# Patient Record
Sex: Male | Born: 1995 | Race: Black or African American | Hispanic: No | Marital: Single | Smoking: Former smoker
Health system: Southern US, Community
[De-identification: ages and names within clinical notes are randomized; demographics above are authoritative.]

## PROBLEM LIST (undated history)

## (undated) ENCOUNTER — Ambulatory Visit: Discharge status: Absent without leave | Payer: Medicaid Other

## (undated) DIAGNOSIS — F419 Anxiety disorder, unspecified: Secondary | ICD-10-CM

## (undated) DIAGNOSIS — E663 Overweight: Secondary | ICD-10-CM

## (undated) DIAGNOSIS — J40 Bronchitis, not specified as acute or chronic: Secondary | ICD-10-CM

## (undated) DIAGNOSIS — W3400XA Accidental discharge from unspecified firearms or gun, initial encounter: Secondary | ICD-10-CM

## (undated) HISTORY — DX: Overweight: E66.3

---
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
## 2013-01-12 ENCOUNTER — Emergency Department (HOSPITAL_COMMUNITY)
Admission: EM | Admit: 2013-01-12 | Discharge: 2013-01-12 | Disposition: A | Discharge status: Home / Self Care | Payer: Self-pay | Attending: Emergency Medicine | Admitting: Emergency Medicine

## 2013-01-12 ENCOUNTER — Encounter (HOSPITAL_COMMUNITY): Payer: Self-pay | Admitting: Emergency Medicine

## 2013-01-12 DIAGNOSIS — J45909 Unspecified asthma, uncomplicated: Secondary | ICD-10-CM | POA: Insufficient documentation

## 2013-01-12 DIAGNOSIS — Z880 Allergy status to penicillin: Secondary | ICD-10-CM | POA: Insufficient documentation

## 2013-01-12 DIAGNOSIS — T63461A Toxic effect of venom of wasps, accidental (unintentional), initial encounter: Secondary | ICD-10-CM | POA: Insufficient documentation

## 2013-01-12 DIAGNOSIS — Y929 Unspecified place or not applicable: Secondary | ICD-10-CM | POA: Insufficient documentation

## 2013-01-12 DIAGNOSIS — T6391XA Toxic effect of contact with unspecified venomous animal, accidental (unintentional), initial encounter: Secondary | ICD-10-CM | POA: Insufficient documentation

## 2013-01-12 DIAGNOSIS — Y939 Activity, unspecified: Secondary | ICD-10-CM | POA: Insufficient documentation

## 2013-01-12 MED ORDER — DEXAMETHASONE SODIUM PHOSPHATE 10 MG/ML IJ SOLN
10.0000 mg | Freq: Once | INTRAMUSCULAR | Status: AC
  Administered 2013-01-12: 10 mg via INTRAMUSCULAR
  Filled 2013-01-12: qty 1

## 2013-01-12 NOTE — Progress Notes (Signed)
Pt states pcp is [facility] [place] EPIC updated

## 2013-01-12 NOTE — ED Provider Notes (Signed)
  CSN: [id number]     Arrival date & time 01/12/13  1311 History     First MD Initiated Contact with Patient 01/12/13 1317     Chief Complaint  Patient presents with  . Insect Bite   (Consider location/radiation/quality/duration/timing/severity/associated sxs/prior Treatment) HPI Patient presents emergency department following a bee sting to his right lower jaw line.  Patient, states, that he decided to mess with a bees nest and was stung in the process.  Patient, states, that he's not having any difficulty breathing.  Mouth swelling, tongue swelling, nausea, vomiting or difficulty swallowing.  Patient, states, that he was given Benadryl by EMS.  He states the area feels better. Past Medical History  Diagnosis Date  . Asthma   . Bronchitis   . Bronchitis    No past surgical history on file. Family History  Problem Relation Age of Onset  . Asthma Mother    History  Substance Use Topics  . Smoking status: Never Smoker   . Smokeless tobacco: Never Used  . Alcohol Use: No    Review of Systems All other systems negative except as documented in the HPI. All pertinent positives and negatives as reviewed in the HPI. Allergies  Penicillins  Home Medications  No current outpatient prescriptions on file. BP 128/89  Pulse 76  Resp 20  SpO2 99% Physical Exam  Constitutional: He appears well-developed and well-nourished. No distress.  HENT:  Head: Normocephalic and atraumatic.    Cardiovascular: Normal rate, regular rhythm and normal heart sounds.  Exam reveals no gallop and no friction rub.   No murmur heard. Pulmonary/Chest: Effort normal and breath sounds normal.    ED Course   Procedures (including critical care time)  Patient, states he needs to go home.  Patient is feeling better at this time.  Patient is advised return here for any worsening in his condition  MDM    Carlyle Dolly, PA-C 01/12/13 1424

## 2013-01-12 NOTE — Progress Notes (Signed)
[facility] provided patient with a list of primary care resources in [place].  °

## 2013-01-12 NOTE — ED Notes (Addendum)
Patient with swelling to right side of face after being stung by a bee today.  No previous allergies to bee stings.  Hives on the back.  No respiratory distress.  EMS gave 50mg  Benadryl and 50mg  Zantac.

## 2013-01-13 NOTE — ED Provider Notes (Signed)
Medical screening examination/treatment/procedure(s) were performed by non-physician practitioner and as supervising physician I was immediately available for consultation/collaboration.  Yomaira Solar, MD 01/13/13 0806 

## 2013-04-19 ENCOUNTER — Other Ambulatory Visit (HOSPITAL_COMMUNITY)
Admission: RE | Admit: 2013-04-19 | Discharge: 2013-04-19 | Disposition: A | Discharge status: Home / Self Care | Payer: Medicaid Other | Source: Ambulatory Visit | Attending: Family Medicine | Admitting: Family Medicine

## 2013-04-19 ENCOUNTER — Emergency Department (INDEPENDENT_AMBULATORY_CARE_PROVIDER_SITE_OTHER)
Admission: EM | Admit: 2013-04-19 | Discharge: 2013-04-19 | Disposition: A | Discharge status: Home / Self Care | Payer: Self-pay | Source: Home / Self Care | Attending: Family Medicine | Admitting: Family Medicine

## 2013-04-19 ENCOUNTER — Encounter (HOSPITAL_COMMUNITY): Payer: Self-pay | Admitting: Emergency Medicine

## 2013-04-19 DIAGNOSIS — Z113 Encounter for screening for infections with a predominantly sexual mode of transmission: Secondary | ICD-10-CM | POA: Insufficient documentation

## 2013-04-19 DIAGNOSIS — B354 Tinea corporis: Secondary | ICD-10-CM

## 2013-04-19 DIAGNOSIS — R369 Urethral discharge, unspecified: Secondary | ICD-10-CM

## 2013-04-19 LAB — POCT URINALYSIS DIP (DEVICE)
Bilirubin Urine: NEGATIVE
Glucose, UA: NEGATIVE mg/dL
Ketones, ur: NEGATIVE mg/dL
Nitrite: NEGATIVE
Protein, ur: NEGATIVE mg/dL
Specific Gravity, Urine: 1.02 (ref 1.005–1.030)
Urobilinogen, UA: 0.2 mg/dL (ref 0.0–1.0)
pH: 7 (ref 5.0–8.0)

## 2013-04-19 LAB — HIV ANTIBODY (ROUTINE TESTING W REFLEX): HIV: NONREACTIVE

## 2013-04-19 LAB — RPR: RPR Ser Ql: NONREACTIVE

## 2013-04-19 MED ORDER — AZITHROMYCIN 250 MG PO TABS
1000.0000 mg | ORAL_TABLET | Freq: Once | ORAL | Status: AC
  Administered 2013-04-19: 1000 mg via ORAL

## 2013-04-19 MED ORDER — AZITHROMYCIN 250 MG PO TABS
ORAL_TABLET | ORAL | Status: AC
  Filled 2013-04-19: qty 4

## 2013-04-19 NOTE — ED Notes (Signed)
Provided crackers and water to take with medicine

## 2013-04-19 NOTE — ED Provider Notes (Signed)
Tracy [name] is a 17 y.o. male who presents to Urgent Care today for penile discharge present for one week. Patient notes mild dysuria. He denies any nausea vomiting diarrhea fevers or chills. This is consistent with the last time and Chlamydia.  Additionally he notes a rash in his groin. This is mildly itchy and has been off and on for several years. He has used Tinactin intermittently which has not helped. He feels well otherwise.  He has an allergy to penicillin. He is not sure of the reaction.   Past Medical History  Diagnosis Date  . Asthma   . Bronchitis   . Bronchitis    History  Substance Use Topics  . Smoking status: Never Smoker   . Smokeless tobacco: Never Used  . Alcohol Use: No   ROS as above Medications reviewed. Current Facility-Administered Medications  Medication Dose Route Frequency Provider Last Rate Last Dose  . azithromycin (ZITHROMAX) tablet 1,000 mg  1,000 mg Oral Once Rodolph Bong, MD       No current outpatient prescriptions on file.    Exam:  BP 138/74  Pulse 71  Temp(Src) 98.8 F (37.1 C) (Oral)  Resp 20  SpO2 97% Gen: Well NAD GENITAL: Normal-appearing circumcised penis with mild clear discharge. Testicles are nontender and normally palpated..  There is a were mild hyperpigmented rash in the groin. No scale. Blue green fluorescence under Wood's lamp.   Results for orders placed during the hospital encounter of 04/19/13 (from the past 24 hour(s))  POCT URINALYSIS DIP (DEVICE)     Status: Abnormal   Collection Time    04/19/13 12:44 PM      Result Value Range   Glucose, UA NEGATIVE  NEGATIVE mg/dL   Bilirubin Urine NEGATIVE  NEGATIVE   Ketones, ur NEGATIVE  NEGATIVE mg/dL   Specific Gravity, Urine 1.020  1.005 - 1.030   Hgb urine dipstick SMALL (*) NEGATIVE   pH 7.0  5.0 - 8.0   Protein, ur NEGATIVE  NEGATIVE mg/dL   Urobilinogen, UA 0.2  0.0 - 1.0 mg/dL   Nitrite NEGATIVE  NEGATIVE   Leukocytes, UA SMALL (*) NEGATIVE   No results  found.  Assessment and Plan: 17 y.o. male with  1) penile discharge. Concern for sexually transmitted infection.  Plan to treat with azithromycin 1 g once. We'll avoid ceftriaxone due to penicillin allergy. If gonorrhea is present will return to clinic in administer ceftriaxone under close observation. 2) rash: Tinea corporis. Plan to treat with Lamisil topical. Discussed warning signs or symptoms. Please see discharge instructions. Patient expresses understanding.      Rodolph Bong, MD 04/19/13 [phone]

## 2013-04-19 NOTE — ED Notes (Signed)
Pt is here for poss STD Sxs today include: penile d/c onset 1 week and dysuria for x2 days Denies: f/v/n/d Alert w/no signs of acute distress.

## 2013-04-21 ENCOUNTER — Telehealth (HOSPITAL_COMMUNITY): Payer: Self-pay | Admitting: *Deleted

## 2013-04-21 NOTE — ED Notes (Addendum)
GC/Chlamydia pos., Trich neg., HIV/RPR non-reactive.  Pt. adequately treated with Zithromax.  Pt. is allergic to PCN, but will need to be given Rocephin under observation per. Dr. Zollie Pee note.  Discussed with Dr. Artis Flock and he agreed. I called pt.  Pt. verified x 2 and given results.  Pt. told he was adequately treated with Zithromax for the Chlamydia and to come back for shot of Rocephin for GC.  Pt. instructed to notify his partner, no sex for 1 week and to practice safe sex. Pt. told he should get HIV rechecked in 6 mos. at the [facility] STD clinic, by appointment.  He said he would come back sometime this week. DHHS form for Chlamydia completed and faxed to the [facility].  Tracy [name] 04/21/2013

## 2013-04-24 ENCOUNTER — Emergency Department (INDEPENDENT_AMBULATORY_CARE_PROVIDER_SITE_OTHER)
Admission: EM | Admit: 2013-04-24 | Discharge: 2013-04-24 | Disposition: A | Discharge status: Home / Self Care | Payer: Self-pay | Source: Home / Self Care | Attending: Family Medicine | Admitting: Family Medicine

## 2013-04-24 ENCOUNTER — Encounter (HOSPITAL_COMMUNITY): Payer: Self-pay | Admitting: Emergency Medicine

## 2013-04-24 DIAGNOSIS — A5400 Gonococcal infection of lower genitourinary tract, unspecified: Secondary | ICD-10-CM

## 2013-04-24 DIAGNOSIS — A549 Gonococcal infection, unspecified: Secondary | ICD-10-CM

## 2013-04-24 DIAGNOSIS — A749 Chlamydial infection, unspecified: Secondary | ICD-10-CM

## 2013-04-24 MED ORDER — CEFTRIAXONE SODIUM 250 MG IJ SOLR
250.0000 mg | Freq: Once | INTRAMUSCULAR | Status: AC
  Administered 2013-04-24: 250 mg via INTRAMUSCULAR

## 2013-04-24 MED ORDER — CEFTRIAXONE SODIUM 250 MG IJ SOLR
INTRAMUSCULAR | Status: AC
  Filled 2013-04-24: qty 250

## 2013-04-24 MED ORDER — LIDOCAINE HCL (PF) 1 % IJ SOLN
INTRAMUSCULAR | Status: AC
  Filled 2013-04-24: qty 5

## 2013-04-24 NOTE — ED Notes (Signed)
Pt tolerated well Rocephin.

## 2013-04-24 NOTE — ED Provider Notes (Signed)
Tracy [name] is a 17 y.o. male who presents to Urgent Care today for gonorrhea. Patient was seen on November 19 for penile discharge. He was tested for gonorrhea and Chlamydia. He was treated empirically with azithromycin. A decision was made to avoid cephalosporins that time due to a remote unknown child penicillin allergy. He thinks perhaps it was a rash. He denies any history of anaphylaxis. His gonorrhea and Chlamydia tests were both positive and he was asked to return for ceftriaxone injection. He feels well currently. He is unsure if she is allergic to cephalosporins.    Past Medical History  Diagnosis Date  . Asthma   . Bronchitis   . Bronchitis    History  Substance Use Topics  . Smoking status: Never Smoker   . Smokeless tobacco: Never Used  . Alcohol Use: No   ROS as above Medications reviewed. Current Facility-Administered Medications  Medication Dose Route Frequency Provider Last Rate Last Dose  . cefTRIAXone (ROCEPHIN) injection 250 mg  250 mg Intramuscular Once Rodolph Bong, MD       No current outpatient prescriptions on file.    Exam:  BP 130/93  Pulse 73  Temp(Src) 99.1 F (37.3 C) (Oral)  Resp 16  SpO2 100% Gen: Well NAD   Assessment and Plan: 17 y.o. male with gonorrhea. Treatment with ceftriaxone. Recommend followup for test of cure at health Department in one month. Discussed warning signs or symptoms. Please see discharge instructions. Patient expresses understanding.      Rodolph Bong, MD 04/24/13 [phone]

## 2013-04-24 NOTE — ED Notes (Signed)
Pt is here to be treated for a pos GC/Chlam... Seen here on 11/19 Reports he is allergic to PCN Alert w/no signs of acute distress.

## 2013-04-25 NOTE — ED Notes (Signed)
DHHS form for GC sent to the [facility]. Tracy [name] 04/25/2013

## 2013-12-28 ENCOUNTER — Encounter (HOSPITAL_COMMUNITY): Payer: Self-pay | Admitting: Emergency Medicine

## 2013-12-28 ENCOUNTER — Emergency Department (HOSPITAL_COMMUNITY)
Admission: EM | Admit: 2013-12-28 | Discharge: 2013-12-28 | Disposition: A | Discharge status: Home / Self Care | Payer: Medicaid Other | Attending: Emergency Medicine | Admitting: Emergency Medicine

## 2013-12-28 DIAGNOSIS — J40 Bronchitis, not specified as acute or chronic: Secondary | ICD-10-CM

## 2013-12-28 DIAGNOSIS — Z880 Allergy status to penicillin: Secondary | ICD-10-CM | POA: Insufficient documentation

## 2013-12-28 DIAGNOSIS — R05 Cough: Secondary | ICD-10-CM | POA: Insufficient documentation

## 2013-12-28 DIAGNOSIS — Z87891 Personal history of nicotine dependence: Secondary | ICD-10-CM | POA: Diagnosis not present

## 2013-12-28 DIAGNOSIS — R059 Cough, unspecified: Secondary | ICD-10-CM | POA: Insufficient documentation

## 2013-12-28 DIAGNOSIS — J45909 Unspecified asthma, uncomplicated: Secondary | ICD-10-CM | POA: Insufficient documentation

## 2013-12-28 MED ORDER — AZITHROMYCIN 250 MG PO TABS: 250.0000 mg | ORAL_TABLET | Freq: Every day | ORAL | Status: DC

## 2013-12-28 MED ORDER — ALBUTEROL SULFATE HFA 108 (90 BASE) MCG/ACT IN AERS
2.0000 | INHALATION_SPRAY | RESPIRATORY_TRACT | Status: DC | PRN
  Administered 2013-12-28: 2 via RESPIRATORY_TRACT
  Filled 2013-12-28: qty 6.7

## 2013-12-28 MED ORDER — ACETAMINOPHEN 325 MG PO TABS
650.0000 mg | ORAL_TABLET | Freq: Once | ORAL | Status: AC
Start: 2013-12-28 — End: 2013-12-28
  Administered 2013-12-28: 650 mg via ORAL
  Filled 2013-12-28: qty 2

## 2013-12-28 MED ORDER — ALBUTEROL SULFATE (2.5 MG/3ML) 0.083% IN NEBU
5.0000 mg | INHALATION_SOLUTION | Freq: Once | RESPIRATORY_TRACT | Status: AC
  Administered 2013-12-28: 5 mg via RESPIRATORY_TRACT
  Filled 2013-12-28: qty 6

## 2013-12-28 NOTE — Discharge Instructions (Signed)

## 2013-12-28 NOTE — ED Notes (Addendum)
Fever, cough, wheezing began 3 days ago.   Pt. Would like a breathing treatment and inhaler.  Denies any asthma, only has bronchitis.

## 2013-12-28 NOTE — ED Notes (Signed)
Called no answer

## 2013-12-28 NOTE — ED Provider Notes (Signed)
CSN: [id number]     Arrival date & time 12/28/13  1614 History  This chart was scribed for Fayrene HelperBowie Allisson Schindel, PA-C working with Rolland PorterMark Sallie, MD by Evon Slackerrance Branch, ED Scribe. This patient was seen in room TR08C/TR08C and the patient's care was started at 5:21 PM.     Chief Complaint  Patient presents with  . URI   Patient is a 18 y.o. male presenting with URI. The history is provided by the patient. No language interpreter was used.  URI Presenting symptoms: cough, fever and sore throat   Presenting symptoms: no ear pain   Associated symptoms: wheezing    HPI Comments: Tracy [name] is a 18 y.o. male with a hx of bronchitis who presents to the Emergency Department complaining of URI sxs onset 2 days prior. He states he had cough, sore throat, wheezing, and a fever of 100.2. He states that he hasn't tried any medications prior to arrival. He states that he occasionally smokes. Denies hematemesis, myalgias, nausea, vomiting, diarrhea or  ear pain.   Past Medical History  Diagnosis Date  . Asthma   . Bronchitis   . Bronchitis    History reviewed. No pertinent past surgical history. Family History  Problem Relation Age of Onset  . Asthma Mother    History  Substance Use Topics  . Smoking status: Former Games developermoker  . Smokeless tobacco: Never Used  . Alcohol Use: Yes     Comment: occassional    Review of Systems  Constitutional: Positive for fever.  HENT: Positive for sore throat. Negative for ear pain.   Respiratory: Positive for cough and wheezing.   Gastrointestinal: Negative for nausea, vomiting and diarrhea.    Allergies  Penicillins  Home Medications   Prior to Admission medications   Not on File   Triage Vitals: BP 158/87  Pulse 89  Temp(Src) 100.2 F (37.9 C) (Oral)  Resp 20  SpO2 98%  Physical Exam  Nursing note and vitals reviewed. Constitutional: He is oriented to person, place, and time. He appears well-developed and well-nourished. No distress.  HENT:  Head:  Normocephalic and atraumatic.  Mouth/Throat: Uvula is midline and oropharynx is clear and moist. No trismus in the jaw. No oropharyngeal exudate, posterior oropharyngeal edema, posterior oropharyngeal erythema or tonsillar abscesses.  Eyes: Conjunctivae and EOM are normal.  Neck: Neck supple.  Cardiovascular: Normal rate and normal heart sounds.   Pulmonary/Chest: Effort normal and breath sounds normal. No respiratory distress. He has no wheezes.  Musculoskeletal: Normal range of motion.  Neurological: He is alert and oriented to person, place, and time.  Skin: Skin is warm and dry.  Psychiatric: He has a normal mood and affect. His behavior is normal.    ED Course  Procedures (including critical care time) DIAGNOSTIC STUDIES: Oxygen Saturation is 98% on RA, normal by my interpretation.    COORDINATION OF CARE: 5:29 PM-Discussed treatment plan which includes albuterol inhaler with pt at bedside and pt agreed to plan.   Will treat recurrent bronchitis with zpak, and albuterol rescue inhaler.  Pt currently report duonebs given in ER has helped his sxs.    Labs Review Labs Reviewed - No data to display  Imaging Review No results found.   EKG Interpretation None      MDM   Final diagnoses:  Bronchitis    BP 158/87  Pulse 89  Temp(Src) 100.2 F (37.9 C) (Oral)  Resp 20  SpO2 98%    I personally performed the services described in  this documentation, which was scribed in my presence. The recorded information has been reviewed and is accurate.      Fayrene Helper, PA-C 12/28/13 1731

## 2013-12-28 NOTE — ED Notes (Signed)
Ambulated while checking O2; sats WNL while ambulating

## 2014-01-02 NOTE — ED Provider Notes (Signed)
Medical screening examination/treatment/procedure(s) were performed by non-physician practitioner and as supervising physician I was immediately available for consultation/collaboration.   EKG Interpretation None        Rolland PorterMark Donn, MD 01/02/14 [phone]

## 2014-01-07 ENCOUNTER — Emergency Department (INDEPENDENT_AMBULATORY_CARE_PROVIDER_SITE_OTHER)
Admission: EM | Admit: 2014-01-07 | Discharge: 2014-01-07 | Disposition: A | Discharge status: Home / Self Care | Payer: Medicaid Other | Source: Home / Self Care | Attending: Family Medicine | Admitting: Family Medicine

## 2014-01-07 ENCOUNTER — Encounter (HOSPITAL_COMMUNITY): Payer: Self-pay | Admitting: Emergency Medicine

## 2014-01-07 DIAGNOSIS — K130 Diseases of lips: Secondary | ICD-10-CM

## 2014-01-07 DIAGNOSIS — R220 Localized swelling, mass and lump, head: Secondary | ICD-10-CM

## 2014-01-07 MED ORDER — DIPHENHYDRAMINE HCL 25 MG PO CAPS
25.0000 mg | ORAL_CAPSULE | Freq: Once | ORAL | Status: AC
  Administered 2014-01-07: 25 mg via ORAL

## 2014-01-07 MED ORDER — DIPHENHYDRAMINE HCL 25 MG PO CAPS
ORAL_CAPSULE | ORAL | Status: AC
  Filled 2014-01-07: qty 1

## 2014-01-07 NOTE — ED Notes (Signed)
Patient c/o swollen lip onset today. Patient reports he was around a cat yesterday and he is unsure if he is allergic. Patient denies SOB. Patient is alert and oriented and in no acute distress.

## 2014-01-07 NOTE — Discharge Instructions (Signed)
Thank you for coming in today. Take 25-50mg  of benadryl every 6 hours as needed for lip swelling.  Come back as needed.  Call or go to the emergency room if you get worse, have trouble breathing, have chest pains, or palpitations.  Go to the ER if your tongue starts to swell.   Angioedema Angioedema is a sudden swelling of tissues, often of the skin. It can occur on the face or genitals or in the abdomen or other body parts. The swelling usually develops over a short period and gets better in 24 to 48 hours. It often begins during the night and is found when the person wakes up. The person may also get red, itchy patches of skin (hives). Angioedema can be dangerous if it involves swelling of the air passages.  Depending on the cause, episodes of angioedema may only happen once, come back in unpredictable patterns, or repeat for several years and then gradually fade away.  CAUSES  Angioedema can be caused by an allergic reaction to various triggers. It can also result from nonallergic causes, including reactions to drugs, immune system disorders, viral infections, or an abnormal gene that is passed to you from your parents (hereditary). For some people with angioedema, the cause is unknown.  Some things that can trigger angioedema include:   Foods.   Medicines, such as ACE inhibitors, ARBs, nonsteroidal anti-inflammatory agents, or estrogen.   Latex.   Animal saliva.   Insect stings.   Dyes used in X-rays.   Mild injury.   Dental work.  Surgery.  Stress.   Sudden changes in temperature.   Exercise. SIGNS AND SYMPTOMS   Swelling of the skin.  Hives. If these are present, there is also intense itching.  Redness in the affected area.   Pain in the affected area.  Swollen lips or tongue.  Breathing problems. This may happen if the air passages swell.  Wheezing. If internal organs are involved, there may be:   Nausea.   Abdominal pain.   Vomiting.    Difficulty swallowing.   Difficulty passing urine. DIAGNOSIS   Your health care provider will examine the affected area and take a medical and family history.  Various tests may be done to help determine the cause. Tests may include:  Allergy skin tests to see if the problem is an allergic reaction.   Blood tests to check for hereditary angioedema.   Tests to check for underlying diseases that could cause the condition.   A review of your medicines, including over-the-counter medicines, may be done. TREATMENT  Treatment will depend on the cause of the angioedema. Possible treatments include:   Removal of anything that triggered the condition (such as stopping certain medicines).   Medicines to treat symptoms or prevent attacks. Medicines given may include:   Antihistamines.   Epinephrine injection.   Steroids.   Hospitalization may be required for severe attacks. If the air passages are affected, it can be an emergency. Tubes may need to be placed to keep the airway open. HOME CARE INSTRUCTIONS   Take all medicines as directed by your health care provider.  If you were given medicines for emergency allergy treatment, always carry them with you.  Wear a medical bracelet as directed by your health care provider.   Avoid known triggers. SEEK MEDICAL CARE IF:   You have repeat attacks of angioedema.   Your attacks are more frequent or more severe despite preventive measures.   You have hereditary angioedema and are  considering having children. It is important to discuss with your health care provider the risks of passing the condition on to your children. SEEK IMMEDIATE MEDICAL CARE IF:   You have severe swelling of the mouth, tongue, or lips.  You have difficulty breathing.   You have difficulty swallowing.   You faint. MAKE SURE YOU:  Understand these instructions.  Will watch your condition.  Will get help right away if you are not doing  well or get worse. Document Released: 07/27/2001 Document Revised: 10/02/2013 Document Reviewed: 01/09/2013 [facility] Patient Information 2015 [place]. This information is not intended to replace advice given to you by your health care provider. Make sure you discuss any questions you have with your health care provider.

## 2014-01-07 NOTE — ED Provider Notes (Signed)
Tracy [name] is a [age] y.o. male who presents to Urgent Care today for upper lip sweling. Swelling developed this morning after spending a night at a friend's apartment where a cat slept in the bed. He feels well otherwise. No tongue swelling or trouble breathing, speaking or swallowing. No injury. No new meds. No treatment tried yet.    Past Medical History  Diagnosis Date  . Asthma   . Bronchitis   . Bronchitis    History  Substance Use Topics  . Smoking status: Former Games developermoker  . Smokeless tobacco: Never Used  . Alcohol Use: Yes     Comment: occassional   ROS as above Medications: Current Facility-Administered Medications  Medication Dose Route Frequency Provider Last Rate Last Dose  . diphenhydrAMINE (BENADRYL) capsule 25 mg  25 mg Oral Once Rodolph BongEvan S Bentley Fissel, MD       Current Outpatient Prescriptions  Medication Sig Dispense Refill  . azithromycin (ZITHROMAX) 250 MG tablet Take 1 tablet (250 mg total) by mouth daily.  4 tablet  0    Exam:  BP 135/87  Pulse 60  Temp(Src) 98.4 F (36.9 C) (Oral)  SpO2 100% Gen: Well NAD HEENT: EOMI,  MMM, moderate upper lip swelling. Non-tender. No tongue swelling. Lungs: Normal work of breathing. CTABL Heart: RRR no MRG Abd: NABS, Soft. Nondistended, Nontender Exts: Brisk capillary refill, warm and well perfused.   No results found for this or any previous visit (from the past 24 hour(s)). No results found.  Assessment and Plan: [age] y.o. male with upper lip swelling. Likely local reaction to cat allergies.  Plan to tx with benadryl.  F/u prn.   Discussed warning signs or symptoms. Please see discharge instructions. Patient expresses understanding.   This note was created using Conservation officer, historic buildingsDragon voice recognition software. Any transcription errors are unintended.    Rodolph BongEvan S Fatoumata Albaugh, MD 01/07/14 [phone]

## 2014-11-09 ENCOUNTER — Emergency Department (HOSPITAL_COMMUNITY)
Admission: EM | Admit: 2014-11-09 | Discharge: 2014-11-09 | Disposition: A | Discharge status: Home / Self Care | Payer: Self-pay | Attending: Emergency Medicine | Admitting: Emergency Medicine

## 2014-11-09 ENCOUNTER — Emergency Department (HOSPITAL_COMMUNITY): Payer: Medicaid Other

## 2014-11-09 ENCOUNTER — Encounter (HOSPITAL_COMMUNITY): Payer: Self-pay | Admitting: Emergency Medicine

## 2014-11-09 DIAGNOSIS — Z880 Allergy status to penicillin: Secondary | ICD-10-CM | POA: Insufficient documentation

## 2014-11-09 DIAGNOSIS — S0990XA Unspecified injury of head, initial encounter: Secondary | ICD-10-CM | POA: Insufficient documentation

## 2014-11-09 DIAGNOSIS — Y998 Other external cause status: Secondary | ICD-10-CM | POA: Insufficient documentation

## 2014-11-09 DIAGNOSIS — Z720 Tobacco use: Secondary | ICD-10-CM | POA: Insufficient documentation

## 2014-11-09 DIAGNOSIS — J45909 Unspecified asthma, uncomplicated: Secondary | ICD-10-CM | POA: Insufficient documentation

## 2014-11-09 DIAGNOSIS — S3992XA Unspecified injury of lower back, initial encounter: Secondary | ICD-10-CM | POA: Insufficient documentation

## 2014-11-09 DIAGNOSIS — Y92410 Unspecified street and highway as the place of occurrence of the external cause: Secondary | ICD-10-CM | POA: Insufficient documentation

## 2014-11-09 DIAGNOSIS — S6991XA Unspecified injury of right wrist, hand and finger(s), initial encounter: Secondary | ICD-10-CM | POA: Insufficient documentation

## 2014-11-09 DIAGNOSIS — Y9389 Activity, other specified: Secondary | ICD-10-CM | POA: Insufficient documentation

## 2014-11-09 DIAGNOSIS — Z792 Long term (current) use of antibiotics: Secondary | ICD-10-CM | POA: Insufficient documentation

## 2014-11-09 MED ORDER — KETOROLAC TROMETHAMINE 60 MG/2ML IM SOLN
60.0000 mg | Freq: Once | INTRAMUSCULAR | Status: AC
  Administered 2014-11-09: 60 mg via INTRAMUSCULAR
  Filled 2014-11-09: qty 2
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
## 2014-11-09 NOTE — ED Notes (Signed)
Patient transported to X-ray via wheelchair 

## 2014-11-09 NOTE — ED Notes (Signed)
Spoke with ortho tech for pt receiving thumb splint

## 2014-11-09 NOTE — Progress Notes (Signed)
Orthopedic Tech Progress Note Patient Details:  Tracy [name] 06/09/1995 [id number]  Ortho Devices Type of Ortho Device: Thumb velcro splint Ortho Device/Splint Location: Right Ortho Device/Splint Interventions: Application   Tracy [name] 11/09/2014, 8:19 AM
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
## 2014-12-26 ENCOUNTER — Emergency Department (HOSPITAL_COMMUNITY)
Admission: EM | Admit: 2014-12-26 | Discharge: 2014-12-26 | Disposition: A | Discharge status: Home / Self Care | Payer: Medicaid Other | Attending: Emergency Medicine | Admitting: Emergency Medicine

## 2014-12-26 ENCOUNTER — Encounter (HOSPITAL_COMMUNITY): Payer: Self-pay | Admitting: Family Medicine

## 2014-12-26 ENCOUNTER — Emergency Department (HOSPITAL_COMMUNITY): Payer: Medicaid Other

## 2014-12-26 DIAGNOSIS — Z720 Tobacco use: Secondary | ICD-10-CM | POA: Insufficient documentation

## 2014-12-26 DIAGNOSIS — R0789 Other chest pain: Secondary | ICD-10-CM | POA: Insufficient documentation

## 2014-12-26 DIAGNOSIS — J45901 Unspecified asthma with (acute) exacerbation: Secondary | ICD-10-CM | POA: Insufficient documentation

## 2014-12-26 DIAGNOSIS — F419 Anxiety disorder, unspecified: Secondary | ICD-10-CM | POA: Insufficient documentation

## 2014-12-26 DIAGNOSIS — Z792 Long term (current) use of antibiotics: Secondary | ICD-10-CM | POA: Insufficient documentation

## 2014-12-26 DIAGNOSIS — Z880 Allergy status to penicillin: Secondary | ICD-10-CM | POA: Insufficient documentation

## 2014-12-26 LAB — CBC WITH DIFFERENTIAL/PLATELET
Basophils Absolute: 0 10*3/uL (ref 0.0–0.1)
Basophils Relative: 2 % — ABNORMAL HIGH (ref 0–1)
Eosinophils Absolute: 0.2 10*3/uL (ref 0.0–0.7)
Eosinophils Relative: 7 % — ABNORMAL HIGH (ref 0–5)
HCT: 44.6 % (ref 39.0–52.0)
Hemoglobin: 16.1 g/dL (ref 13.0–17.0)
Lymphocytes Relative: 42 % (ref 12–46)
Lymphs Abs: 1.1 10*3/uL (ref 0.7–4.0)
MCH: 31 pg (ref 26.0–34.0)
MCHC: 36.1 g/dL — ABNORMAL HIGH (ref 30.0–36.0)
MCV: 85.8 fL (ref 78.0–100.0)
Monocytes Absolute: 0.2 10*3/uL (ref 0.1–1.0)
Monocytes Relative: 9 % (ref 3–12)
Neutro Abs: 1 10*3/uL — ABNORMAL LOW (ref 1.7–7.7)
Neutrophils Relative %: 40 % — ABNORMAL LOW (ref 43–77)
Platelets: 219 10*3/uL (ref 150–400)
RBC: 5.2 MIL/uL (ref 4.22–5.81)
RDW: 12.2 % (ref 11.5–15.5)
WBC: 2.6 10*3/uL — ABNORMAL LOW (ref 4.0–10.5)

## 2014-12-26 LAB — BASIC METABOLIC PANEL
Anion gap: 6 (ref 5–15)
BUN: 11 mg/dL (ref 6–20)
CO2: 26 mmol/L (ref 22–32)
Calcium: 9.3 mg/dL (ref 8.9–10.3)
Chloride: 106 mmol/L (ref 101–111)
Creatinine, Ser: 0.93 mg/dL (ref 0.61–1.24)
GFR calc Af Amer: >60 mL/min (ref >60)
GFR calc non Af Amer: >60 mL/min (ref >60)
Glucose, Bld: 98 mg/dL (ref 65–99)
Potassium: 3.7 mmol/L (ref 3.5–5.1)
Sodium: 138 mmol/L (ref 135–145)

## 2014-12-26 MED ORDER — LORAZEPAM 1 MG PO TABS: 1.0000 mg | ORAL_TABLET | Freq: Three times a day (TID) | ORAL | Status: DC | PRN

## 2014-12-26 NOTE — ED Provider Notes (Signed)
CSN: [id number]     Arrival date & time 12/26/14  1454 History   First MD Initiated Contact with Patient 12/26/14 1636     Chief Complaint  Patient presents with  . Dizziness     (Consider location/radiation/quality/duration/timing/severity/associated sxs/prior Treatment) Patient is a 19 y.o. male presenting with dizziness. The history is provided by the patient and medical records.  Dizziness    19 year old male with history of asthma and bronchitis, presenting to the ED for chest tightness and shortness of breath. Patient states he was lying in bed this morning when symptoms began. He states he was very hard to catch his breath. He states this lasted for approximately 20 minutes. Afterwards he did feel somewhat lightheaded. No syncope. Patient reports he believes that he suffers from anxiety. He states he feels this way frequently when he is stressed out or is very anxious. He denies any recent increase in stress. He states he has been having difficulty falling asleep and has been waking up multiple times throughout the night. He is averaging 3-4 hours of sleep a night for the past 2 weeks. He denies any excessive caffeine intake. Currently, patient is asymptomatic. No personal or family cardiac history. Patient is an occasional smoker. He denies any illicit drug use.  Past Medical History  Diagnosis Date  . Asthma   . Bronchitis   . Bronchitis    History reviewed. No pertinent past surgical history. Family History  Problem Relation Age of Onset  . Asthma Mother    History  Substance Use Topics  . Smoking status: Current Some Day Smoker    Types: Cigarettes  . Smokeless tobacco: Never Used  . Alcohol Use: Yes     Comment: occassional    Review of Systems  Respiratory: Positive for chest tightness.   Neurological: Positive for dizziness.  All other systems reviewed and are negative.     Allergies  Penicillins  Home Medications   Prior to Admission medications    Medication Sig Start Date End Date Taking? Authorizing Provider  azithromycin (ZITHROMAX) 250 MG tablet Take 1 tablet (250 mg total) by mouth daily. 12/28/13   Fayrene Helper, PA-C   BP 133/70 mmHg  Pulse 63  Temp(Src) 98.3 F (36.8 C) (Oral)  Resp 18  SpO2 97%   Physical Exam  Constitutional: He is oriented to person, place, and time. He appears well-developed and well-nourished. No distress.  NAD, texting on cell phone  HENT:  Head: Normocephalic and atraumatic.  Mouth/Throat: Oropharynx is clear and moist.  Eyes: Conjunctivae and EOM are normal. Pupils are equal, round, and reactive to light.  Neck: Normal range of motion. Neck supple.  Cardiovascular: Normal rate, regular rhythm and normal heart sounds.   Pulmonary/Chest: Effort normal and breath sounds normal. No respiratory distress. He has no wheezes.  Abdominal: Soft. Bowel sounds are normal. There is no tenderness. There is no guarding.  Musculoskeletal: Normal range of motion. He exhibits no edema.  Neurological: He is alert and oriented to person, place, and time.  AAOx3, answering questions and following commands appropriately; equal strength UE and LE bilaterally; CN grossly intact; moves all extremities appropriately without ataxia; no focal neuro deficits or facial asymmetry appreciated; normal coordination, normal gait  Skin: Skin is warm and dry. He is not diaphoretic.  Psychiatric: He has a normal mood and affect. He is not actively hallucinating. He expresses no homicidal and no suicidal ideation. He expresses no suicidal plans and no homicidal plans.  Nursing note and  vitals reviewed.   ED Course  Procedures (including critical care time) Labs Review Labs Reviewed  CBC WITH DIFFERENTIAL/PLATELET - Abnormal; Notable for the following:    WBC 2.6 (*)    MCHC 36.1 (*)    Neutrophils Relative % 40 (*)    Neutro Abs 1.0 (*)    Eosinophils Relative 7 (*)    Basophils Relative 2 (*)    All other components within  normal limits  BASIC METABOLIC PANEL    Imaging Review Dg Chest 2 View  12/26/2014   CLINICAL DATA:  Shortness of breath with chest tightness for 1 day  EXAM: CHEST  2 VIEW  COMPARISON:  August 10, 2011  FINDINGS: Lungs are clear. Heart size and pulmonary vascularity are normal. No adenopathy. No pneumothorax. No bone lesions.  IMPRESSION: No abnormality noted.   Electronically Signed   By: Bretta Bang III M.D.   On: 12/26/2014 17:03     EKG Interpretation   Date/Time:  Wednesday December 26 2014 17:17:49 EDT Ventricular Rate:  64 PR Interval:  150 QRS Duration: 80 QT Interval:  396 QTC Calculation: 408 R Axis:   79 Text Interpretation:  Normal sinus rhythm with sinus arrhythmia Normal ECG  No old tracing to compare Confirmed by BELFI  MD, MELANIE ([id number]) on  12/26/2014 5:37:56 PM      MDM   Final diagnoses:  Chest tightness   19 year old male here with the episode of chest tightness, shortness of breath, and dizziness this morning. He suspects he had an anxiety attack, history of same. Patient is afebrile, nontoxic. He does not appear to be in any distress on my exam. He is lying comfortably in bed resting on his cell phone. His neurologic exam is nonfocal. Brief workup including EKG, labs, and chest x-ray negative for acute findings.  Patient remains calm here, no significant anxiety noted. I doubt ACS, PE, dissection, or other acute cardiac event. Also doubt acute intracranial pathology given normal neurologic exam and no known risk factors. Symptoms may have anxiety component. No SI/HI/AVH. Will discharge home with when necessary Ativan. Patient [facility] as well as outpatient psychiatry resources if needed.  Discussed plan with patient, he/she acknowledged understanding and agreed with plan of care.  Return precautions given for new or worsening symptoms.  Garlon Hatchet, PA-C 12/26/14 2001  Rolan Bucco, MD 12/26/14 2113

## 2014-12-26 NOTE — ED Notes (Addendum)
Pt sts he was lying in bed and became SOB and chest was tight. sts cough. sts he just doesn't feel well. sts he hasn't been getting much sleep. sts hx of anxiety attack.

## 2014-12-26 NOTE — ED Notes (Signed)
Pt still on his phone

## 2014-12-26 NOTE — Discharge Instructions (Signed)
Take the prescribed medication as directed. [facility]  If you feel you to see a psychiatrist or counselor, please see resource guide below for outpatient follow-up. Return to the ED for new or worsening symptoms.   Emergency Department Resource Guide 1) Find a Doctor and Pay Out of Pocket Although you won't have to find out who is covered by your insurance plan, it is a good idea to ask around and get recommendations. You will then need to call the office and see if the doctor you have chosen will accept you as a new patient and what types of options they offer for patients who are self-pay. Some doctors offer discounts or will set up payment plans for their patients who do not have insurance, but you will need to ask so you aren't surprised when you get to your appointment.  2) Contact Your Local Health Department Not all health departments have doctors that can see patients for sick visits, but many do, so it is worth a call to see if yours does. If you don't know where your local health department is, you can check in your phone book. The CDC also has a tool to help you locate your state's health department, and many state websites also have listings of all of their local health departments.  3) Find a Walk-in Clinic If your illness is not likely to be very severe or complicated, you may want to try a walk in clinic. These are popping up all over the country in pharmacies, drugstores, and shopping centers. They're usually staffed by nurse practitioners or physician assistants that have been trained to treat common illnesses and complaints. They're usually fairly quick and inexpensive. However, if you have serious medical issues or chronic medical problems, these are probably not your best option.  No Primary Care Doctor: - Call Health Connect at  [phone] - they can help you locate a primary care doctor that  accepts your insurance, provides certain services,  etc. - Physician Referral Service- [phone]  Chronic Pain Problems: Organization         Address  Phone   Notes  [facility]  [phone] Patients need to be referred by their primary care doctor.   Medication Assistance: Organization         Address  Phone   Notes  [facility] Medication [facility] [address] [place] [place], [place] [address] [phone] --Must be a resident of [facility] -- Must have NO insurance coverage whatsoever (no Medicaid/ Medicare, etc.) -- The pt. MUST have a primary care doctor that directs their care regularly and follows them in the community   Med[facility]ist  [phone]   [employer]  [phone]    Agencies that provide inexpensive medical care: Organization         Address  Phone   Notes  [facility]  [phone]   [facility]    [phone]   [facility] [address] [place], [place] [address] [phone]   [facility] [address] [place] [place] [phone]   Planned Parenthood    [phone]   [facility]    [phone]   [facility]  [address], [place] Phone:  [phone], Fax:  [phone] Hours of Operation:  9 am - 6 pm, M-F.  Also accepts Medicaid/Medicare and  self-pay.  [facility] for [place], [address], [place] Phone: [phone], Fax: [phone]. Hours of Operation:  8:30 am - 5:30 pm, M-F.  Also accepts Medicaid and self-pay.  [facility] [place] [address] [place] Phone: [phone]   [place], [place], [place] [phone], Ext. [phone] Mondays & Thursdays: 7-9 AM.  First 15 patients are seen on a first come, first serve basis.    [place] Providers:  Organization         Address  Phone   Notes  [facility] [address], [place] [phone] Also accepts self-pay patients.  [facility] [id number] [place], [place]  [phone]   [place], [address], [place] [phone]   [facility] [address], [place] [phone]   Lucianne Lei [address], [place]   [phone] Only accepts [place] Access [place] patients after they have their name applied to their card.   Self-Pay (no insurance) in [facility]:  Organization         Address  Phone   Notes  Sickle Cell Patients, [facility] [place] [phone]   [facility] [place] [phone]   [facility] [place]  [place], [place], [place] [phone]   [facility]/Dr. Osei-Bonsu  [address], [place] or [address], [place] [phone] Phone number for both [place] and [place] locations is the same.  [facility]gent Medical and [facility] [place] [address] [place] [phone]   [facility] [address], [place] or [address] [phone]   [facility] [address] [place] [phone], phone; [phone], fax Sees patients 1st and 3rd Saturday of every month.  Must not qualify for public or private insurance (i.e. Medicaid, Medicare, [place] Health Choice, Veterans' Benefits)  Household income should be no more than 200% of the poverty level The clinic cannot treat you if you are pregnant or think you are pregnant  Sexually transmitted diseases are not treated at the clinic.    Dental Care: Organization         Address  Phone  Notes  [facility] of [facility] [place] [phone] Accepts children up to  age 32 who are enrolled in [place] or [place]; pregnant women with a Medicaid card; and children who have applied for Medicaid or [place] Health Choice, but were declined, whose parents can pay a reduced fee at time of service.  [facility] Department of [facility]  [address], [place] [phone] Accepts children up to age 69 who are enrolled in [place] or [place]; pregnant women with a Medicaid card; and children who have applied for Medicaid or  Health Choice, but were declined, whose parents can pay a reduced fee at time of service.  [facility] Adult Dental Access PROGRAM  [place] [phone] Patients are seen by appointment only. Walk-ins are not accepted. [place] will see patients 72 years of age and older. Monday - Tuesday (8am-5pm) Most Wednesdays (8:30-5pm) $  30 per visit, cash only  [facility] Adult [employer] PROGRAM  [address], [facility] [phone] Patients are seen by appointment only. Walk-ins are not accepted. [place] will see patients 41 years of age and older. One Wednesday Evening (Monthly: Volunteer Based).  $30 per visit, cash only  [place]  [phone] for adults; Children under age 58, call Graduate Pediatric Dentistry at [phone]. Children aged 52-14, please call [phone] to request a pediatric application.  Dental services are provided in all areas of dental care including fillings, crowns and bridges, complete and partial dentures, implants, gum treatment, root canals, and extractions. Preventive care is also provided. Treatment is provided to both adults and children. Patients are selected via a lottery and there is often a waiting list.   [facility] [address], [place]  [phone] [email or web address]   [facility] [address] [place], [place] [phone], Ext. [phone] Second and Fourth Thursday of  each month, opens at 6:30 AM; Clinic ends at 9 AM.  Patients are seen on a first-come first-served basis, and a limited number are seen during each clinic.   [facility]  [address] Hillard Danker [place], [place] [phone]   Eligibility Requirements You must have lived in [place], [place], or [place] counties for at least the last three months.   You cannot be eligible for state or federal sponsored [employer], including [employer], [place], or [employer].   You generally cannot be eligible for healthcare insurance through your employer.    How to apply: Eligibility screenings are held every Tuesday and Wednesday afternoon from 1:00 pm until 4:00 pm. You do not need an appointment for the interview!  [facility] [address] [place], [place]   [place]  [place] Department  [place]  [phone]    Behavioral Health Resources in the Community: Intensive Outpatient Programs Organization         Address  Phone  Notes  [place]. [address], [place], [place] [phone]   [facility] [address] [place], [place]   ADS: [facility] [address] [place], [place]   [place] [address],  [place], [place] or [phone]   Substance Abuse Resources Organization         Address  Phone  Notes  Alcohol and Drug Services  [phone]   [place]  [phone]   [place]   Chinita Pester  [phone]   Residential & Outpatient Substance Abuse Program  [phone]   Psychological Services Organization         Address  Phone  Notes  [facility] [place]  [place]  [phone]   [place] [address]  [place] or [phone]    Mobile Crisis Teams Organization         Address  Phone  Notes  [facility], Mobile Crisis Care Unit  [phone]   [facility]  [address] [place], [place]   [place] Levels [address] [place] [phone]    Self-Help/Support Groups Organization         Address  Phone  Notes  [facility] [place] - variety of support groups  [place] Call for more information  Narcotics Anonymous (NA), [facility]aring [facility] [address], [employer] [place]  2 meetings at this location   Special educational needs teacher         Address  Phone  Notes  ASAP Residential Treatment [place],    [place]  [phone]   [facility]  [address], [place] [phone], [place], [place]   [place], [place] [phone] Admissions: 8am-3pm M-F  Incentives Substance [place] [address],    [place], [place] [phone]   [facility] [address] [place], [place], [place]   [facility] [address]  [place], [place]   [facility] Programs - Intensive Outpatient [address], [place], [place]   [facility] ([place].) [place].,  [place], [place] [phone] or [phone]   [facility] Services (RTS) [address] [place], [place] Accepts Medicaid  [facility] [place] [address],  [place] [phone] Substance Abuse/Addiction Treatment   [facility] Organization         Address  Phone  Notes  [facility]  [phone]   Domenic Schwab, PhD [address] Arlis Porta [place], [place]   [phone] or [phone]   [place], [place] [phone]     [facility] [address], [place], [place] [phone] Insurance/Medicaid/sponsorship through [facility] and Families [address], [address]                                    [place], [place] [phone] [place] [address], [place] [phone]    Dr. Adele Schilder  [phone]   [facility] Dept. 1) [address], [place] 2) [place] 3)  [place], [place] [phone]  [phone]   [place] [phone] or [phone] (After Hours)

## 2014-12-26 NOTE — ED Notes (Signed)
The pt reports anxiety and difficulty sleeping for a while  He is currently texting on his phone

## 2014-12-26 NOTE — ED Notes (Signed)
Pt. Returned from xray 

## 2015-03-28 ENCOUNTER — Encounter (HOSPITAL_COMMUNITY): Payer: Self-pay | Admitting: Emergency Medicine

## 2015-03-28 ENCOUNTER — Emergency Department (HOSPITAL_COMMUNITY)
Admission: EM | Admit: 2015-03-28 | Discharge: 2015-03-28 | Disposition: A | Discharge status: Home / Self Care | Payer: Medicaid Other | Attending: Emergency Medicine | Admitting: Emergency Medicine

## 2015-03-28 DIAGNOSIS — R066 Hiccough: Secondary | ICD-10-CM | POA: Insufficient documentation

## 2015-03-28 DIAGNOSIS — Z113 Encounter for screening for infections with a predominantly sexual mode of transmission: Secondary | ICD-10-CM | POA: Insufficient documentation

## 2015-03-28 DIAGNOSIS — Z720 Tobacco use: Secondary | ICD-10-CM | POA: Insufficient documentation

## 2015-03-28 DIAGNOSIS — R112 Nausea with vomiting, unspecified: Secondary | ICD-10-CM | POA: Insufficient documentation

## 2015-03-28 DIAGNOSIS — J45909 Unspecified asthma, uncomplicated: Secondary | ICD-10-CM | POA: Insufficient documentation

## 2015-03-28 DIAGNOSIS — Z880 Allergy status to penicillin: Secondary | ICD-10-CM | POA: Insufficient documentation

## 2015-03-28 LAB — COMPREHENSIVE METABOLIC PANEL
ALT: 13 U/L — ABNORMAL LOW (ref 17–63)
AST: 19 U/L (ref 15–41)
Albumin: 4.9 g/dL (ref 3.5–5.0)
Alkaline Phosphatase: 53 U/L (ref 38–126)
Anion gap: 7 (ref 5–15)
BUN: 14 mg/dL (ref 6–20)
CO2: 31 mmol/L (ref 22–32)
Calcium: 9.8 mg/dL (ref 8.9–10.3)
Chloride: 101 mmol/L (ref 101–111)
Creatinine, Ser: 1.1 mg/dL (ref 0.61–1.24)
GFR calc Af Amer: >60 mL/min (ref >60)
GFR calc non Af Amer: >60 mL/min (ref >60)
Glucose, Bld: 90 mg/dL (ref 65–99)
Potassium: 4.1 mmol/L (ref 3.5–5.1)
Sodium: 139 mmol/L (ref 135–145)
Total Bilirubin: 1 mg/dL (ref 0.3–1.2)
Total Protein: 7.8 g/dL (ref 6.5–8.1)

## 2015-03-28 LAB — LIPASE, BLOOD: Lipase: 31 U/L (ref 11–51)

## 2015-03-28 LAB — URINALYSIS, ROUTINE W REFLEX MICROSCOPIC
Bilirubin Urine: NEGATIVE
Glucose, UA: NEGATIVE mg/dL
Ketones, ur: NEGATIVE mg/dL
Nitrite: NEGATIVE
Protein, ur: NEGATIVE mg/dL
Specific Gravity, Urine: 1.016 (ref 1.005–1.030)
Urobilinogen, UA: 0.2 mg/dL (ref 0.0–1.0)
pH: 6 (ref 5.0–8.0)

## 2015-03-28 LAB — CBC
HCT: 48.7 % (ref 39.0–52.0)
Hemoglobin: 17.5 g/dL — ABNORMAL HIGH (ref 13.0–17.0)
MCH: 30.5 pg (ref 26.0–34.0)
MCHC: 35.9 g/dL (ref 30.0–36.0)
MCV: 85 fL (ref 78.0–100.0)
Platelets: 265 10*3/uL (ref 150–400)
RBC: 5.73 MIL/uL (ref 4.22–5.81)
RDW: 12.2 % (ref 11.5–15.5)
WBC: 3.5 10*3/uL — ABNORMAL LOW (ref 4.0–10.5)

## 2015-03-28 LAB — URINE MICROSCOPIC-ADD ON

## 2015-03-28 MED ORDER — ONDANSETRON HCL 4 MG/2ML IJ SOLN
4.0000 mg | Freq: Once | INTRAMUSCULAR | Status: AC
  Administered 2015-03-28: 4 mg via INTRAVENOUS
  Filled 2015-03-28: qty 2

## 2015-03-28 MED ORDER — SODIUM CHLORIDE 0.9 % IV SOLN
1000.0000 mL | INTRAVENOUS | Status: DC
  Administered 2015-03-28: 1000 mL via INTRAVENOUS

## 2015-03-28 MED ORDER — SODIUM CHLORIDE 0.9 % IV SOLN
1000.0000 mL | Freq: Once | INTRAVENOUS | Status: AC
  Administered 2015-03-28: 1000 mL via INTRAVENOUS

## 2015-03-28 MED ORDER — AZITHROMYCIN 250 MG PO TABS
1000.0000 mg | ORAL_TABLET | Freq: Once | ORAL | Status: AC
  Administered 2015-03-28: 1000 mg via ORAL
  Filled 2015-03-28: qty 4

## 2015-03-28 MED ORDER — ONDANSETRON 4 MG PO TBDP: 4.0000 mg | ORAL_TABLET | Freq: Once | ORAL | Status: DC

## 2015-03-28 MED ORDER — METRONIDAZOLE 500 MG PO TABS
2000.0000 mg | ORAL_TABLET | Freq: Once | ORAL | Status: AC
  Administered 2015-03-28: 2000 mg via ORAL
  Filled 2015-03-28: qty 4

## 2015-03-28 MED ORDER — LIDOCAINE HCL (PF) 1 % IJ SOLN
INTRAMUSCULAR | Status: AC
  Administered 2015-03-28: 0.9 mL
  Filled 2015-03-28: qty 5

## 2015-03-28 MED ORDER — CEFTRIAXONE SODIUM 250 MG IJ SOLR
250.0000 mg | Freq: Once | INTRAMUSCULAR | Status: AC
  Administered 2015-03-28: 250 mg via INTRAMUSCULAR
  Filled 2015-03-28: qty 250

## 2015-03-28 NOTE — Progress Notes (Signed)
Patient noted to not have a pcp or insurance living in [place].  [facility] printed free medical clinics and contact information for DSS in [place] [facility] [place] [facility].  EDCM went to speak to patient at bedside, however, patient was discharged.

## 2015-03-28 NOTE — Discharge Instructions (Signed)
You have been seen today for vomiting and a STD check. Your lab results will be called to you. Follow up with PCP as needed. Return to ED should symptoms worsen.

## 2015-03-28 NOTE — ED Provider Notes (Signed)
CSN: [id number]     Arrival date & time 03/28/15  1737 History   First MD Initiated Contact with Patient 03/28/15 1924     Chief Complaint  Patient presents with  . Emesis  . Hiccups     (Consider location/radiation/quality/duration/timing/severity/associated sxs/prior Treatment) HPI   Tracy [name] is a 19 y.o. male, pt with history of asthma, presents with vomiting for the last three or four days and vague RUQ abdominal pain. Denies fever/chills, diarrhea/constipation, shortness of breath, dizziness. Pt has no problem eating. Pt description of pain is vague, rated 5/10, radiating to right flank. Pt has not tried anything for it. Pt also complains of clear/yellow penile discharge for the past two days. No urinary complaints.     Past Medical History  Diagnosis Date  . Asthma   . Bronchitis   . Bronchitis    History reviewed. No pertinent past surgical history. Family History  Problem Relation Age of Onset  . Asthma Mother    Social History  Substance Use Topics  . Smoking status: Current Some Day Smoker    Types: Cigarettes  . Smokeless tobacco: Never Used  . Alcohol Use: Yes     Comment: occassional    Review of Systems  Constitutional: Negative for fever, chills, diaphoresis and unexpected weight change.  Respiratory: Negative for cough, chest tightness and shortness of breath.   Cardiovascular: Negative for palpitations and leg swelling.  Gastrointestinal: Positive for nausea and vomiting. Negative for abdominal pain, diarrhea and constipation.  Genitourinary: Negative for dysuria and flank pain.  Musculoskeletal: Negative for back pain.  Skin: Negative for color change and pallor.  Neurological: Negative for dizziness, syncope, weakness and light-headedness.  All other systems reviewed and are negative.     Allergies  Penicillins  Home Medications   Prior to Admission medications   Medication Sig Start Date End Date Taking? Authorizing Provider   LORazepam (ATIVAN) 1 MG tablet Take 1 tablet (1 mg total) by mouth 3 (three) times daily as needed for anxiety. Patient not taking: Reported on 03/28/2015 12/26/14   Garlon Hatchet, PA-C   BP 125/67 mmHg  Pulse 87  Temp(Src) 99.1 F (37.3 C) (Oral)  Resp 16  SpO2 99% Physical Exam  Constitutional: He appears well-developed and well-nourished. No distress.  HENT:  Head: Normocephalic and atraumatic.  Eyes: Conjunctivae are normal. Pupils are equal, round, and reactive to light.  Cardiovascular: Normal rate, regular rhythm and normal heart sounds.   Pulmonary/Chest: Effort normal and breath sounds normal. No respiratory distress.  Abdominal: Soft. Bowel sounds are normal.  Genitourinary: Testes normal and penis normal. Cremasteric reflex is present. Circumcised.  No tenderness, erythema, or penile discharge. No lesions noted. Normal male genitalia. No testicular tenderness or swelling.   Musculoskeletal: He exhibits no edema or tenderness.  Neurological: He is alert.  Skin: Skin is warm and dry. He is not diaphoretic.  Nursing note and vitals reviewed.   ED Course  Procedures (including critical care time) Labs Review Labs Reviewed  COMPREHENSIVE METABOLIC PANEL - Abnormal; Notable for the following:    ALT 13 (*)    All other components within normal limits  CBC - Abnormal; Notable for the following:    WBC 3.5 (*)    Hemoglobin 17.5 (*)    All other components within normal limits  URINALYSIS, ROUTINE W REFLEX MICROSCOPIC (NOT AT [facility]) - Abnormal; Notable for the following:    APPearance CLOUDY (*)    Hgb urine dipstick TRACE (*)  Leukocytes, UA MODERATE (*)    All other components within normal limits  LIPASE, BLOOD  URINE MICROSCOPIC-ADD ON  RPR  HIV ANTIBODY (ROUTINE TESTING)  GC/CHLAMYDIA PROBE AMP ([place]) NOT AT [facility]    Imaging Review No results found. I have personally reviewed and evaluated these images and lab results as part of my medical  decision-making.   EKG Interpretation None      MDM   Final diagnoses:  Screen for STD (sexually transmitted disease)  Non-intractable vomiting with nausea, vomiting of unspecified type    Raelyn EnsignJames H Labree presents with vomiting and vague RUQ abdominal pain. Pt also requests a STD check.  Findings and plan of care discussed with Bethann BerkshireJoseph Zammit, MD.  Pt shows no indication for imaging at this time. STD panel ordered. Pt to be treated empirically for STDs. Pt is allergic to PCN, reported in childhood with unknown reaction, but a note with the allergy states pt tolerated Rocephin injection well in 2014. Pt to receive zofran and IV fluids for his vomiting.  9:17 PM Pt states he feels "all better" and requests discharge. Discharge processed.  Anselm PancoastShawn C Evonne Rinks, PA-C 03/28/15 2118  Bethann BerkshireJoseph Zammit, MD 04/01/15 [phone]

## 2015-03-28 NOTE — ED Notes (Addendum)
Pt states that he has had hiccups, RUQ pain and vomiting x the past 2 days. Also states that he wants an STD check. Alert and oriented.

## 2015-03-29 LAB — RPR: RPR Ser Ql: NONREACTIVE

## 2015-03-29 LAB — GC/CHLAMYDIA PROBE AMP (~~LOC~~) NOT AT ARMC
Chlamydia: POSITIVE — AB
Neisseria Gonorrhea: POSITIVE — AB

## 2015-03-29 LAB — HIV ANTIBODY (ROUTINE TESTING W REFLEX): HIV Screen 4th Generation wRfx: NONREACTIVE

## 2015-04-01 ENCOUNTER — Telehealth (HOSPITAL_COMMUNITY): Payer: Self-pay

## 2015-04-01 NOTE — Telephone Encounter (Signed)
Spoke with pt. Verified ID. Informed of labs. Treated per protocol. DHHS form faxed. Pt informed to abstain from sexual activity x 10 days and to notify partner for testing and treatment.  

## 2015-08-05 ENCOUNTER — Emergency Department (HOSPITAL_COMMUNITY)
Admission: EM | Admit: 2015-08-05 | Discharge: 2015-08-05 | Disposition: A | Discharge status: Home / Self Care | Payer: No Typology Code available for payment source | Attending: Emergency Medicine | Admitting: Emergency Medicine

## 2015-08-05 ENCOUNTER — Encounter (HOSPITAL_COMMUNITY): Payer: Self-pay | Admitting: Family Medicine

## 2015-08-05 DIAGNOSIS — Z79899 Other long term (current) drug therapy: Secondary | ICD-10-CM | POA: Insufficient documentation

## 2015-08-05 DIAGNOSIS — J45901 Unspecified asthma with (acute) exacerbation: Secondary | ICD-10-CM | POA: Insufficient documentation

## 2015-08-05 DIAGNOSIS — Z880 Allergy status to penicillin: Secondary | ICD-10-CM | POA: Insufficient documentation

## 2015-08-05 DIAGNOSIS — F17210 Nicotine dependence, cigarettes, uncomplicated: Secondary | ICD-10-CM | POA: Insufficient documentation

## 2015-08-05 DIAGNOSIS — J209 Acute bronchitis, unspecified: Secondary | ICD-10-CM

## 2015-08-05 MED ORDER — ALBUTEROL SULFATE (2.5 MG/3ML) 0.083% IN NEBU
5.0000 mg | INHALATION_SOLUTION | Freq: Once | RESPIRATORY_TRACT | Status: AC
  Administered 2015-08-05: 5 mg via RESPIRATORY_TRACT
  Filled 2015-08-05: qty 6

## 2015-08-05 MED ORDER — AEROCHAMBER PLUS W/MASK MISC: Status: DC

## 2015-08-05 MED ORDER — ALBUTEROL SULFATE HFA 108 (90 BASE) MCG/ACT IN AERS: 1.0000 | INHALATION_SPRAY | Freq: Four times a day (QID) | RESPIRATORY_TRACT | Status: DC | PRN

## 2015-08-05 NOTE — ED Provider Notes (Signed)
CSN: [id number]     Arrival date & time 08/05/15  0027 History  By signing my name below, I, Linus Galas, attest that this documentation has been prepared under the direction and in the presence of Arby Barrette, MD. Electronically Signed: Linus Galas, ED Scribe. 08/05/2015. 2:06 AM.   Chief Complaint  Patient presents with  . Asthma   The history is provided by the patient. No language interpreter was used.   HPI Comments: Tracy [name] is a 20 y.o. male who presents to the Emergency Department complaining of a possible asthma exacerbation, PTA. Pt reports he was feeling SOB with associated wheezing but is currently asymptomatic. Pt states he is not formally diagnosed with asthma but uses an inhaler occasionally. Pt states he used his inhaler during his breathing difficulty with mild relief. Pt denies any chest pain, cough or any other symptoms at this time. Pt is a current smoker but has not smoked today. Pt works at [employer] and suspect that "dust may have triggered his asthma".  Past Medical History  Diagnosis Date  . Asthma   . Bronchitis   . Bronchitis    History reviewed. No pertinent past surgical history. Family History  Problem Relation Age of Onset  . Asthma Mother    Social History  Substance Use Topics  . Smoking status: Current Some Day Smoker    Types: Cigarettes  . Smokeless tobacco: Never Used  . Alcohol Use: Yes     Comment: 1-2 tiems a week.     Review of Systems A complete 10 system review of systems was obtained and all systems are negative except as noted in the HPI and PMH.   Allergies  Penicillins  Home Medications   Prior to Admission medications   Medication Sig Start Date End Date Taking? Authorizing Provider  albuterol (PROVENTIL HFA;VENTOLIN HFA) 108 (90 Base) MCG/ACT inhaler Inhale 1-2 puffs into the lungs every 6 (six) hours as needed for wheezing or shortness of breath. 08/05/15   Arby Barrette, MD  LORazepam (ATIVAN) 1 MG tablet Take 1  tablet (1 mg total) by mouth 3 (three) times daily as needed for anxiety. Patient not taking: Reported on 03/28/2015 12/26/14   Garlon Hatchet, PA-C  Spacer/Aero-Holding Chambers (AEROCHAMBER PLUS WITH MASK) inhaler Use as instructed 08/05/15   Arby Barrette, MD   BP 124/90 mmHg  Pulse 80  Temp(Src) 98.6 F (37 C) (Oral)  Resp 20  Ht 6' (1.829 m)  Wt 160 lb (72.576 kg)  BMI 21.70 kg/m2  SpO2 96%   Physical Exam  Constitutional: He is oriented to person, place, and time. He appears well-developed and well-nourished.  Non-toxic appearance.  HENT:  Head: Normocephalic and atraumatic.  Mouth/Throat: Oropharynx is clear and moist.  Eyes: EOM are normal. Pupils are equal, round, and reactive to light. No scleral icterus.  Neck: Normal range of motion. Neck supple.  Cardiovascular: Normal rate, regular rhythm, normal heart sounds and intact distal pulses.  Exam reveals no gallop and no friction rub.   No murmur heard. Pulmonary/Chest: Effort normal and breath sounds normal. No respiratory distress. He has no wheezes.  Abdominal: Soft. He exhibits no distension.  Musculoskeletal: Normal range of motion. He exhibits no edema or tenderness.  No peripheral edema; no calf tenderness  Neurological: He is alert and oriented to person, place, and time. He exhibits normal muscle tone. Coordination normal.  Skin: Skin is warm and dry.  Psychiatric: He has a normal mood and affect.  Nursing note  and vitals reviewed.   ED Course  Procedures   DIAGNOSTIC STUDIES: Oxygen Saturation is 96% on room air, normal by my interpretation.    COORDINATION OF CARE: 2:00 AM Discussed treatment plan with pt at bedside and pt agreed to plan.  Labs Review Labs Reviewed - No data to display  Imaging Review No results found. I have personally reviewed and evaluated these images and lab results as part of my medical decision-making.   EKG Interpretation None      MDM   Final diagnoses:  Acute  bronchitis, unspecified organism    Patient Is well, Minimal symptoms of cough for past couple days. He reports wheezing this evening. He did not have an inhaler. At this time he has clear lungs with no respiratory distress. Patient is given a refill inhaler with spacer.    Arby BarretteMarcy Jazminn Pomales, MD 08/05/15 [phone]

## 2015-08-05 NOTE — Discharge Instructions (Signed)

## 2015-08-05 NOTE — ED Notes (Signed)
Pt reports he started experiencing an asthma attack about 30 minutes ago. Pt lost his inhaler and does have another rescue inhaler.

## 2015-08-17 IMAGING — DX DG CHEST 2V
2 series · 2 of 2 positions shown · non-contrast
Comparison: August 10, 2011

CLINICAL DATA: Shortness of breath with chest tightness for 1 day

EXAM:
CHEST  2 VIEW

[chest pa]
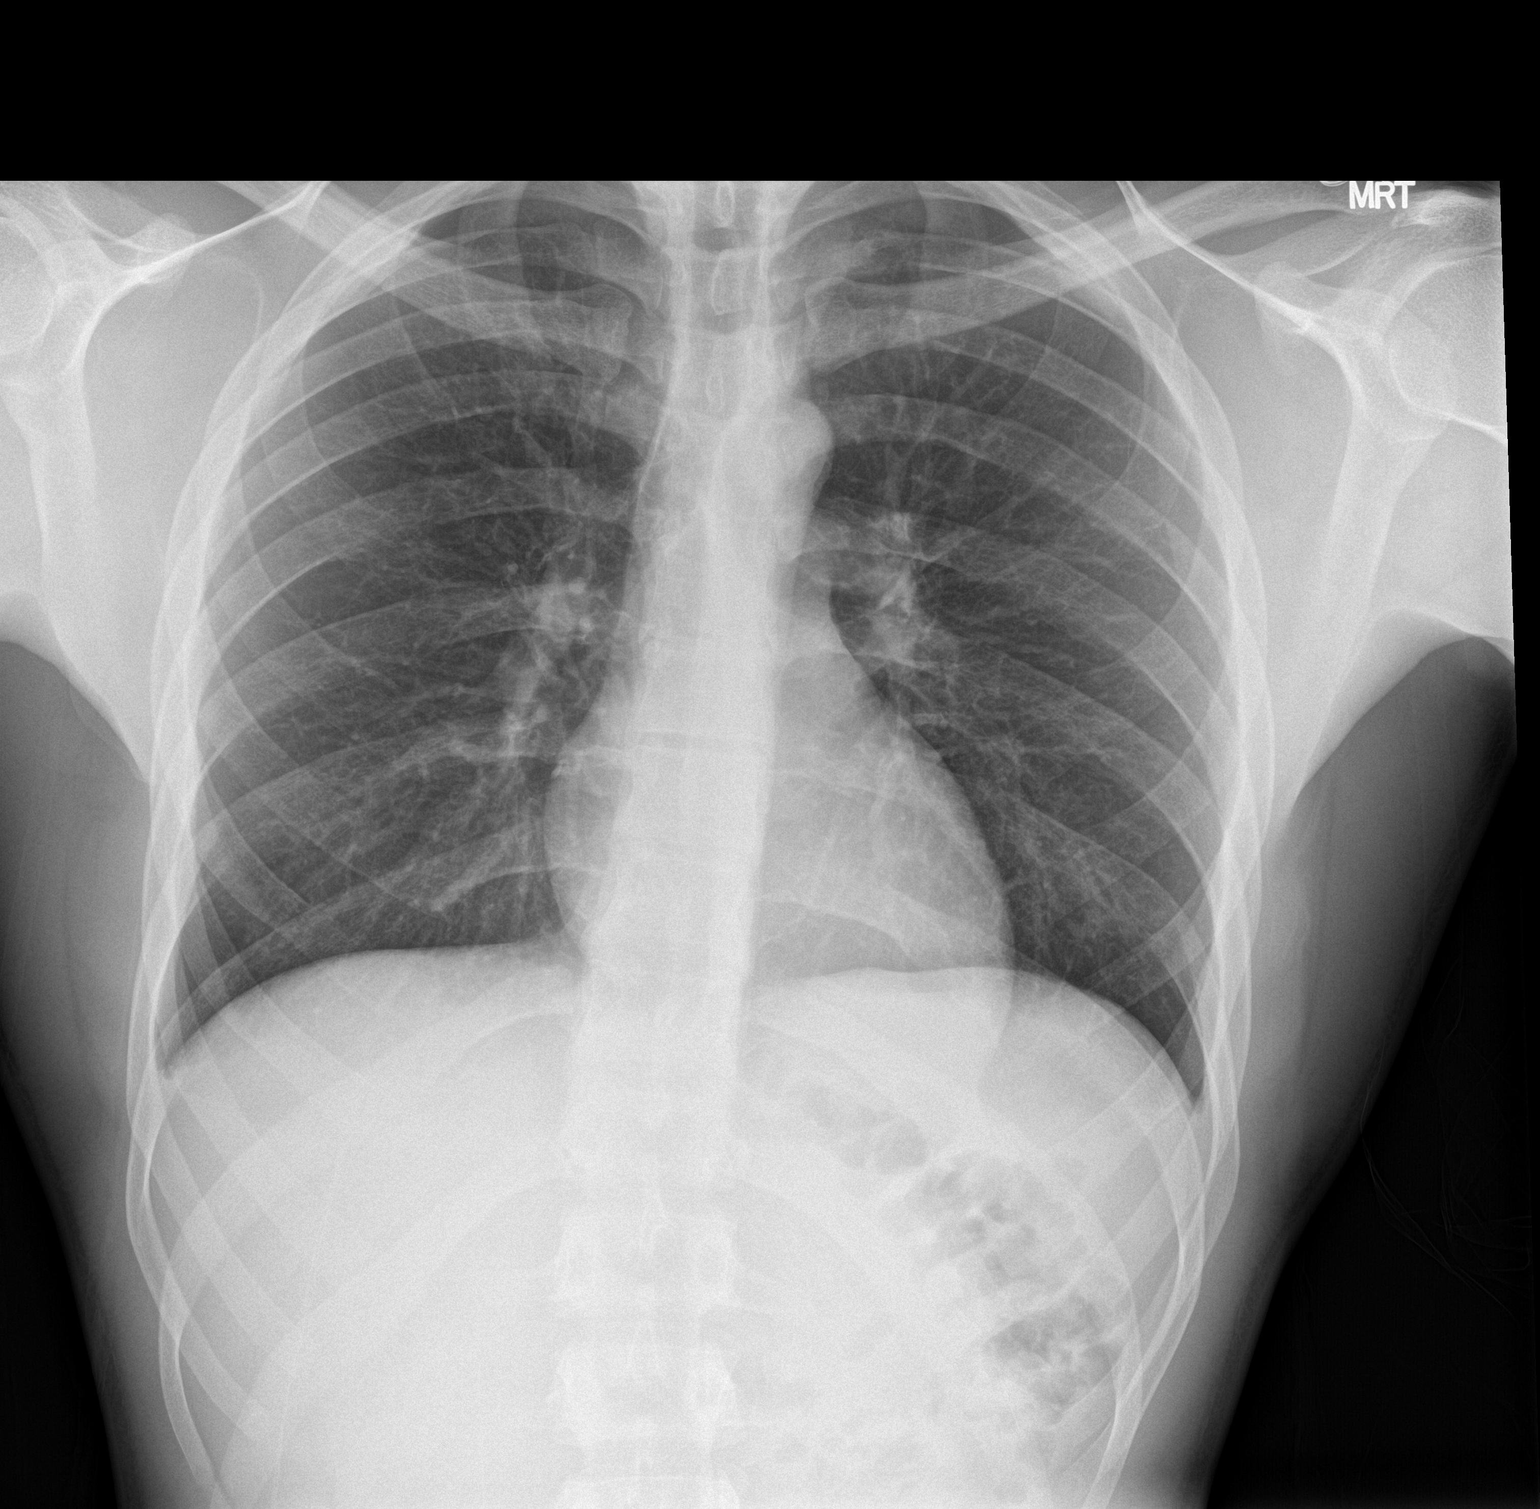

[chest lat]
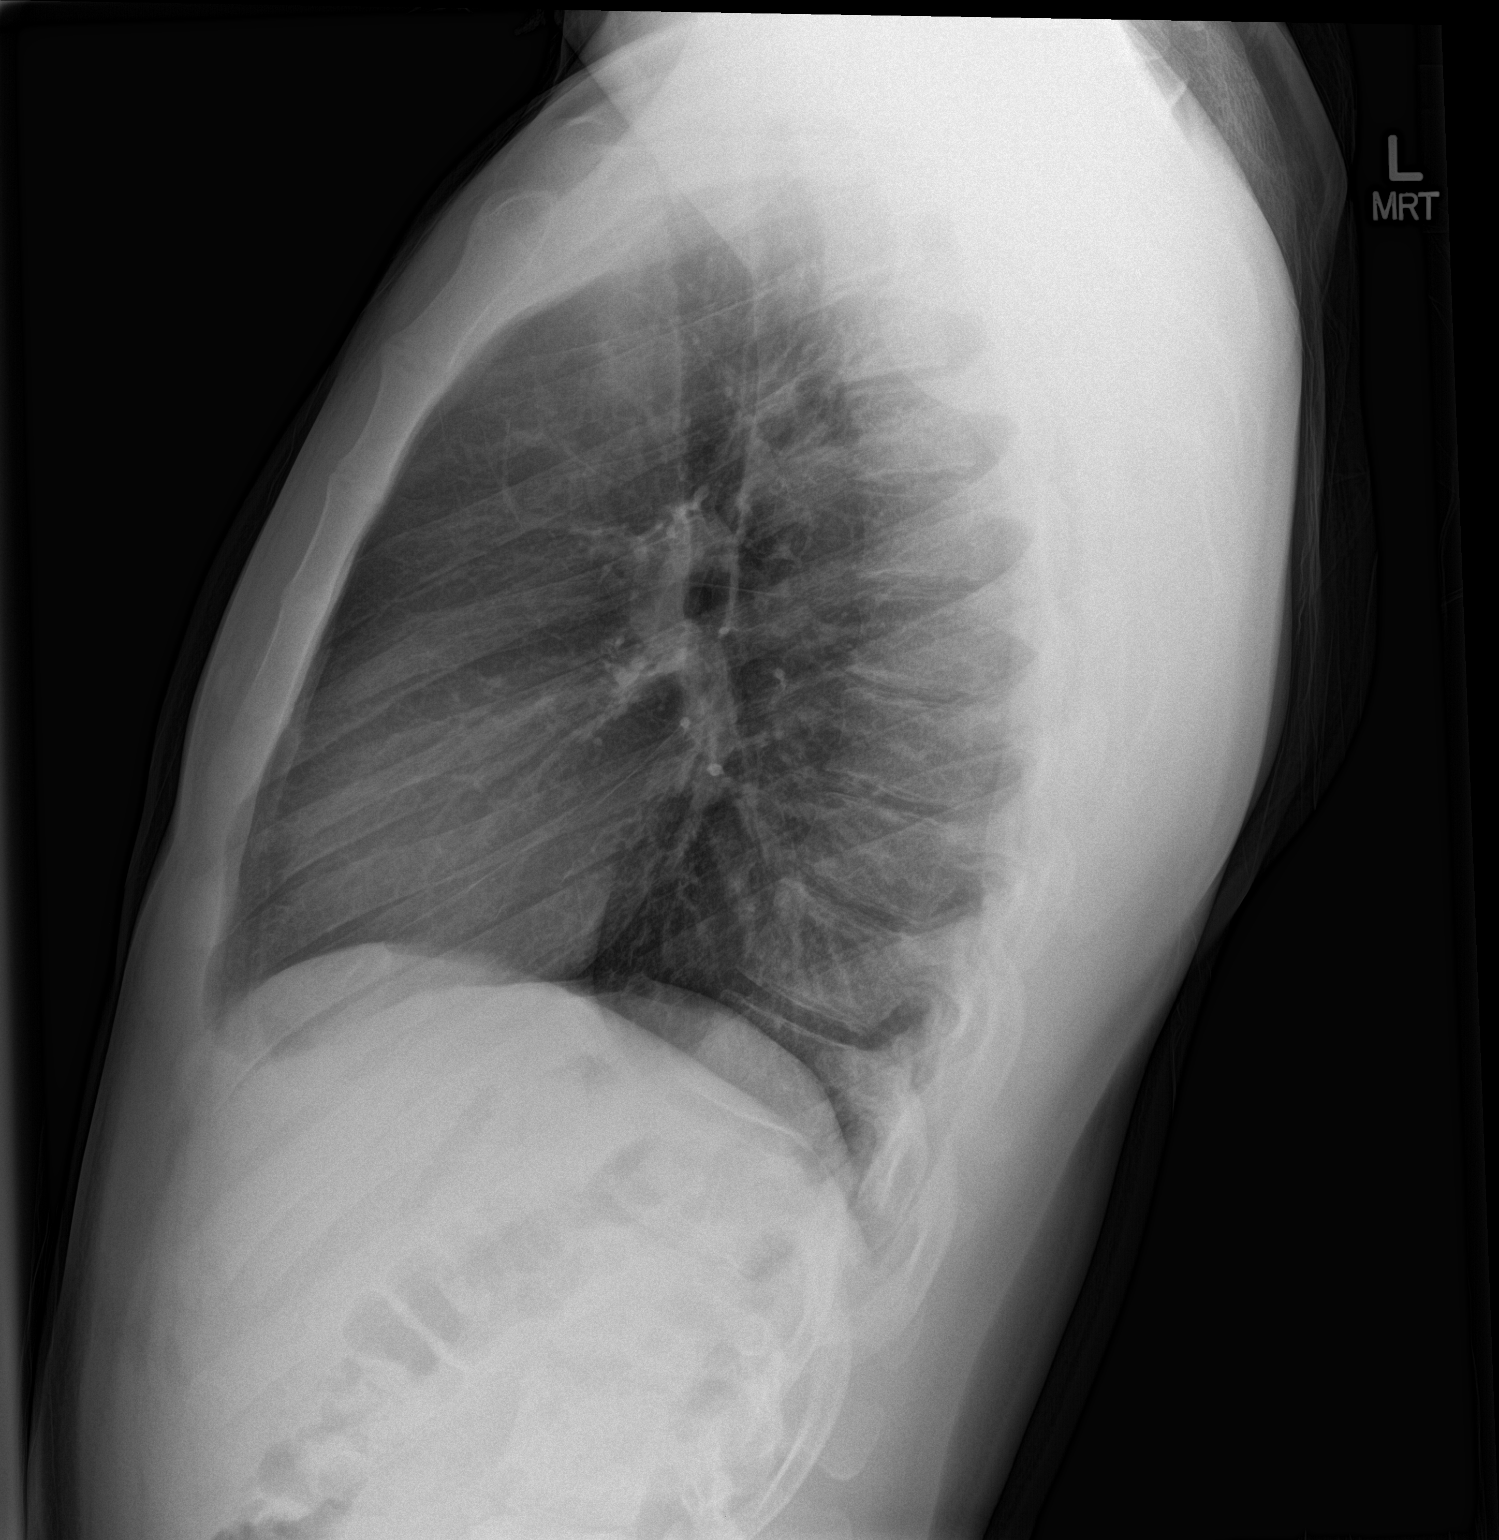

[2 of 2 positions shown; findings below may reference images not displayed]

FINDINGS: Lungs are clear. Heart size and pulmonary vascularity are normal. No
adenopathy. No pneumothorax. No bone lesions.
IMPRESSION: No abnormality noted.

## 2015-09-22 ENCOUNTER — Emergency Department (HOSPITAL_COMMUNITY): Payer: No Typology Code available for payment source

## 2015-09-22 ENCOUNTER — Emergency Department (HOSPITAL_COMMUNITY)
Admission: EM | Admit: 2015-09-22 | Discharge: 2015-09-22 | Disposition: A | Discharge status: Home / Self Care | Payer: No Typology Code available for payment source | Attending: Emergency Medicine | Admitting: Emergency Medicine

## 2015-09-22 ENCOUNTER — Encounter (HOSPITAL_COMMUNITY): Payer: Self-pay | Admitting: Emergency Medicine

## 2015-09-22 DIAGNOSIS — Z711 Person with feared health complaint in whom no diagnosis is made: Secondary | ICD-10-CM

## 2015-09-22 DIAGNOSIS — S4991XA Unspecified injury of right shoulder and upper arm, initial encounter: Secondary | ICD-10-CM | POA: Insufficient documentation

## 2015-09-22 DIAGNOSIS — Y9289 Other specified places as the place of occurrence of the external cause: Secondary | ICD-10-CM | POA: Insufficient documentation

## 2015-09-22 DIAGNOSIS — Z8619 Personal history of other infectious and parasitic diseases: Secondary | ICD-10-CM | POA: Insufficient documentation

## 2015-09-22 DIAGNOSIS — F17210 Nicotine dependence, cigarettes, uncomplicated: Secondary | ICD-10-CM | POA: Insufficient documentation

## 2015-09-22 DIAGNOSIS — Y998 Other external cause status: Secondary | ICD-10-CM | POA: Insufficient documentation

## 2015-09-22 DIAGNOSIS — Z79899 Other long term (current) drug therapy: Secondary | ICD-10-CM | POA: Insufficient documentation

## 2015-09-22 DIAGNOSIS — Z880 Allergy status to penicillin: Secondary | ICD-10-CM | POA: Insufficient documentation

## 2015-09-22 DIAGNOSIS — Z202 Contact with and (suspected) exposure to infections with a predominantly sexual mode of transmission: Secondary | ICD-10-CM | POA: Insufficient documentation

## 2015-09-22 DIAGNOSIS — Y9389 Activity, other specified: Secondary | ICD-10-CM | POA: Insufficient documentation

## 2015-09-22 DIAGNOSIS — J45909 Unspecified asthma, uncomplicated: Secondary | ICD-10-CM | POA: Insufficient documentation

## 2015-09-22 DIAGNOSIS — X58XXXA Exposure to other specified factors, initial encounter: Secondary | ICD-10-CM | POA: Insufficient documentation

## 2015-09-22 LAB — URINALYSIS, ROUTINE W REFLEX MICROSCOPIC
Bilirubin Urine: NEGATIVE
Glucose, UA: NEGATIVE mg/dL
Ketones, ur: 15 mg/dL — AB
Leukocytes, UA: NEGATIVE
Nitrite: NEGATIVE
Protein, ur: NEGATIVE mg/dL
Specific Gravity, Urine: 1.028 (ref 1.005–1.030)
pH: 6 (ref 5.0–8.0)

## 2015-09-22 LAB — URINE MICROSCOPIC-ADD ON

## 2015-09-22 MED ORDER — AZITHROMYCIN 250 MG PO TABS
1000.0000 mg | ORAL_TABLET | Freq: Once | ORAL | Status: AC
  Administered 2015-09-22: 1000 mg via ORAL
  Filled 2015-09-22: qty 4

## 2015-09-22 MED ORDER — ALBUTEROL SULFATE HFA 108 (90 BASE) MCG/ACT IN AERS
2.0000 | INHALATION_SPRAY | Freq: Once | RESPIRATORY_TRACT | Status: AC
  Administered 2015-09-22: 2 via RESPIRATORY_TRACT
  Filled 2015-09-22: qty 6.7

## 2015-09-22 MED ORDER — LIDOCAINE HCL (PF) 1 % IJ SOLN
INTRAMUSCULAR | Status: AC
  Administered 2015-09-22: 5 mL
  Filled 2015-09-22: qty 5

## 2015-09-22 MED ORDER — CEFTRIAXONE SODIUM 250 MG IJ SOLR
250.0000 mg | Freq: Once | INTRAMUSCULAR | Status: AC
  Administered 2015-09-22: 250 mg via INTRAMUSCULAR
  Filled 2015-09-22: qty 250

## 2015-09-22 NOTE — Discharge Instructions (Signed)
Follow-up with your doctor for further evaluation and management of your shoulder injury. If you're unable to follow up with your doctor he may follow-up with orthopedics. Continue taking Tylenol or Motrin for your discomfort. You were treated for sexually transmitted disease in the emergency department. Return to ED for any new or worsening symptoms.

## 2015-09-22 NOTE — ED Notes (Addendum)
Pt c/o R shoulder pain after dislocation this evening. Pt states shoulder does dislocate very frequently but he is able to reduce. Pt states this time feels different after reduction with severe pain Pt also c/o penile d/c x 2-3 days, would like STD screen Pt also c/o asthma s/s, pt states he does not have an inhaler and would like one that he gets from ED, stating he can afford one with a script
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
## 2015-09-23 LAB — RPR: RPR Ser Ql: NONREACTIVE

## 2015-09-23 LAB — HIV ANTIBODY (ROUTINE TESTING W REFLEX): HIV Screen 4th Generation wRfx: NONREACTIVE

## 2015-12-30 ENCOUNTER — Encounter (HOSPITAL_COMMUNITY): Payer: Self-pay | Admitting: Emergency Medicine

## 2015-12-30 DIAGNOSIS — F17210 Nicotine dependence, cigarettes, uncomplicated: Secondary | ICD-10-CM | POA: Insufficient documentation

## 2015-12-30 DIAGNOSIS — Z5321 Procedure and treatment not carried out due to patient leaving prior to being seen by health care provider: Secondary | ICD-10-CM | POA: Insufficient documentation

## 2015-12-30 DIAGNOSIS — J45909 Unspecified asthma, uncomplicated: Secondary | ICD-10-CM | POA: Insufficient documentation

## 2015-12-30 DIAGNOSIS — G43909 Migraine, unspecified, not intractable, without status migrainosus: Secondary | ICD-10-CM | POA: Insufficient documentation

## 2015-12-30 NOTE — ED Triage Notes (Signed)
Pt. reports headache onset this evening , denies head injury , no fever or chills. No nausea or emesis .

## 2015-12-31 ENCOUNTER — Emergency Department (HOSPITAL_COMMUNITY)
Admission: EM | Admit: 2015-12-31 | Discharge: 2015-12-31 | Disposition: A | Discharge status: Home / Self Care | Payer: Medicaid Other | Attending: Dermatology | Admitting: Dermatology

## 2015-12-31 NOTE — ED Notes (Signed)
Pt states wishes to go home. A/o x 4, in NAD.

## 2016-11-15 ENCOUNTER — Emergency Department (INDEPENDENT_AMBULATORY_CARE_PROVIDER_SITE_OTHER)
Admission: EM | Admit: 2016-11-15 | Discharge: 2016-11-15 | Disposition: A | Discharge status: Home / Self Care | Payer: Self-pay | Source: Home / Self Care | Attending: Family Medicine | Admitting: Family Medicine

## 2016-11-15 ENCOUNTER — Encounter: Payer: Self-pay | Admitting: Emergency Medicine

## 2016-11-15 DIAGNOSIS — T7840XA Allergy, unspecified, initial encounter: Secondary | ICD-10-CM

## 2016-11-15 DIAGNOSIS — T783XXA Angioneurotic edema, initial encounter: Secondary | ICD-10-CM

## 2016-11-15 DIAGNOSIS — L509 Urticaria, unspecified: Secondary | ICD-10-CM

## 2016-11-15 MED ORDER — EPINEPHRINE 0.3 MG/0.3ML IJ SOAJ: 0.3000 mg | Freq: Once | INTRAMUSCULAR | 0 refills | Status: AC

## 2016-11-15 MED ORDER — METHYLPREDNISOLONE ACETATE 80 MG/ML IJ SUSP
80.0000 mg | Freq: Once | INTRAMUSCULAR | Status: AC
  Administered 2016-11-15: 80 mg via INTRAMUSCULAR

## 2016-11-15 MED ORDER — FAMOTIDINE 40 MG PO TABS
40.0000 mg | ORAL_TABLET | Freq: Once | ORAL | Status: AC
  Administered 2016-11-15: 40 mg via ORAL

## 2016-11-15 MED ORDER — DIPHENHYDRAMINE HCL 50 MG/ML IJ SOLN
25.0000 mg | Freq: Once | INTRAMUSCULAR | Status: AC
  Administered 2016-11-15: 25 mg via INTRAMUSCULAR

## 2016-11-15 MED ORDER — PREDNISONE 20 MG PO TABS: ORAL_TABLET | ORAL | 0 refills | Status: DC

## 2016-11-15 MED ORDER — CETIRIZINE HCL 10 MG PO TABS: 10.0000 mg | ORAL_TABLET | Freq: Every day | ORAL | 0 refills | Status: DC

## 2016-11-15 NOTE — ED Provider Notes (Signed)
CSN: [id number]     Arrival date & time 11/15/16  1423 History   First MD Initiated Contact with Patient 11/15/16 1423     Chief Complaint  Patient presents with  . Allergic Reaction   (Consider location/radiation/quality/duration/timing/severity/associated sxs/prior Treatment) HPI  Tracy [name] is a 21 y.o. male presenting to UC with c/o allergic reaction to possibly an insect bite, vinegar applied to the bite, or pasta with sauce he was eating about 30 minutes prior to symptoms starting.  Pt notes he developed diffuse itching including his scalps, arms, chest, back and legs that turned into a rash with welts.  He feels like his tongue is starting to swelling as well as his lips but denies SOB.  Denies prior hx of allergic reaction.  Denies known allergies. He is not on any daily medication and did not take any new medication recently.  His girlfriend drove him to UC.  He has not taken anything for his reaction PTA.      Past Medical History:  Diagnosis Date  . Asthma   . Bronchitis   . Bronchitis    History reviewed. No pertinent surgical history. Family History  Problem Relation Age of Onset  . Asthma Mother    Social History  Substance Use Topics  . Smoking status: Current Some Day Smoker    Types: Cigarettes  . Smokeless tobacco: Never Used  . Alcohol use Yes     Comment: 1-2 tiems a week.     Review of Systems  HENT: Positive for facial swelling ( feels like his tongue is swelling too). Negative for trouble swallowing and voice change.   Respiratory: Negative for cough, chest tightness, shortness of breath and wheezing.   Gastrointestinal: Positive for nausea. Negative for abdominal pain, diarrhea and vomiting.  Musculoskeletal: Negative for arthralgias and joint swelling.  Skin: Positive for rash.    Allergies  Penicillins  Home Medications   Prior to Admission medications   Medication Sig Start Date End Date Taking? Authorizing Provider  albuterol (PROVENTIL  HFA;VENTOLIN HFA) 108 (90 Base) MCG/ACT inhaler Inhale 1-2 puffs into the lungs every 6 (six) hours as needed for wheezing or shortness of breath. 08/05/15   Arby BarrettePfeiffer, Marcy, MD  cetirizine (ZYRTEC) 10 MG tablet Take 1 tablet (10 mg total) by mouth daily. For at least 1 week, then daily as needed for allergies 11/15/16   Lurene ShadowPhelps, Chimaobi Casebolt O, PA-C  EPINEPHrine 0.3 mg/0.3 mL IJ SOAJ injection Inject 0.3 mLs (0.3 mg total) into the muscle once. 11/15/16 11/15/16  Lurene ShadowPhelps, Silas Muff O, PA-C  predniSONE (DELTASONE) 20 MG tablet 3 tabs po day one, then 2 po daily x 4 days 11/15/16   Lurene ShadowPhelps, Ruble Buttler O, PA-C  Spacer/Aero-Holding Chambers (AEROCHAMBER PLUS WITH MASK) inhaler Use as instructed 08/05/15   Arby BarrettePfeiffer, Marcy, MD   Meds Ordered and Administered this Visit   Medications  methylPREDNISolone acetate (DEPO-MEDROL) injection 80 mg (80 mg Intramuscular Given 11/15/16 1438)  famotidine (PEPCID) tablet 40 mg (40 mg Oral Given 11/15/16 1438)  diphenhydrAMINE (BENADRYL) injection 25 mg (25 mg Intramuscular Given 11/15/16 1439)    BP 115/64 (BP Location: Left Arm)   Pulse 66   Resp 16   SpO2 99%  No data found.   Physical Exam  Constitutional: He is oriented to person, place, and time. He appears well-developed and well-nourished. No distress.  HENT:  Head: Normocephalic and atraumatic.  Eyes: EOM are normal.  Neck: Normal range of motion.  Cardiovascular: Normal rate and regular rhythm.  Pulmonary/Chest: Effort normal and breath sounds normal. No respiratory distress. He has no wheezes. He has no rales.  Musculoskeletal: Normal range of motion.  Neurological: He is alert and oriented to person, place, and time.  Skin: Skin is warm and dry. Rash noted. He is not diaphoretic. There is erythema.  Diffuse urticaria on face around his mouth, chest, back, arms, and legs. Right side anterior chest- faint erythematous welt at location of reported insect bite. No foreign body such as a stinger visible.   Psychiatric: He has  a normal mood and affect. His behavior is normal.  Nursing note and vitals reviewed.   Urgent Care Course     Procedures (including critical care time)  Labs Review Labs Reviewed - No data to display  Imaging Review No results found.   MDM   1. Allergic reaction, initial encounter   2. Angioedema, initial encounter   3. Hives     Pt appears to be having an allergic reaction, most likely due to an insect bite on Right anterior chest.  Depomedrol 80mg  IM, Benadryl 25mg  IM and Pepcid 40mg  PO given in UC  Pt monitored about 2 hours in UC. Urticaria and swelling resolved.   Pt fatigued from benadryl but accompanied by his girlfriend who is driving.    Vitals: normal Pt feels comfortable being discharged home. Rx: Prednisone, cetirizine, and EpiPen. Home instructions provided. Establish care with a PCP     Lurene Shadow, PA-C 11/15/16 1625

## 2016-11-15 NOTE — Discharge Instructions (Signed)
° °  Please be sure to read the information in this packet about serious, life threatening allergic reactions.  While today's reaction was bad, it was easily managed in urgent care with prednisone, bendaryl, and pepcid.  Unfortunately, additional exposure to whatever caused today's reaction, possible insect bite, could lead to an even worse reaction the next time.  Be sure to carry your Epi pen with you wherever you go and if you do need to use it- lip or tongue swelling, difficulty breathing, be sure to call 911 or go to closest emergency department for continued monitoring and care.

## 2016-11-15 NOTE — ED Triage Notes (Signed)
Pt c/o possible allergic reaction today, not sure to what, hives, some tongue swelling and difficulty breathing.

## 2016-12-10 DIAGNOSIS — S51032A Puncture wound without foreign body of left elbow, initial encounter: Secondary | ICD-10-CM

## 2016-12-10 HISTORY — DX: Puncture wound without foreign body of left elbow, initial encounter: S51.032A

## 2016-12-14 ENCOUNTER — Emergency Department (HOSPITAL_COMMUNITY)
Admission: EM | Admit: 2016-12-14 | Discharge: 2016-12-14 | Disposition: A | Discharge status: Home / Self Care | Payer: Self-pay | Attending: Emergency Medicine | Admitting: Emergency Medicine

## 2016-12-14 ENCOUNTER — Encounter (HOSPITAL_COMMUNITY): Payer: Self-pay | Admitting: *Deleted

## 2016-12-14 DIAGNOSIS — J45909 Unspecified asthma, uncomplicated: Secondary | ICD-10-CM | POA: Insufficient documentation

## 2016-12-14 DIAGNOSIS — Z79899 Other long term (current) drug therapy: Secondary | ICD-10-CM | POA: Insufficient documentation

## 2016-12-14 DIAGNOSIS — S41102D Unspecified open wound of left upper arm, subsequent encounter: Secondary | ICD-10-CM | POA: Insufficient documentation

## 2016-12-14 DIAGNOSIS — R030 Elevated blood-pressure reading, without diagnosis of hypertension: Secondary | ICD-10-CM | POA: Insufficient documentation

## 2016-12-14 DIAGNOSIS — Y999 Unspecified external cause status: Secondary | ICD-10-CM | POA: Insufficient documentation

## 2016-12-14 DIAGNOSIS — Y9389 Activity, other specified: Secondary | ICD-10-CM | POA: Insufficient documentation

## 2016-12-14 DIAGNOSIS — F17210 Nicotine dependence, cigarettes, uncomplicated: Secondary | ICD-10-CM | POA: Insufficient documentation

## 2016-12-14 DIAGNOSIS — Y929 Unspecified place or not applicable: Secondary | ICD-10-CM | POA: Insufficient documentation

## 2016-12-14 DIAGNOSIS — W3400XA Accidental discharge from unspecified firearms or gun, initial encounter: Secondary | ICD-10-CM | POA: Insufficient documentation

## 2016-12-14 HISTORY — DX: Accidental discharge from unspecified firearms or gun, initial encounter: W34.00XA

## 2016-12-14 NOTE — ED Triage Notes (Signed)
To ED for eval of left arm pain. Pt states he was to follow up with [facility] on Friday but couldn't get there. Pt has arm in sling. Able to move fingers without difficulty

## 2016-12-14 NOTE — Discharge Instructions (Addendum)
Wash wounds daily in shower with soap and water. Do not soak. Apply antibiotic ointment (e.g. Neosporin) twice daily and as needed to keep moist. Cover with dry dressing.  Work on moving elbow twice daily. Your blood pressure should be rechecked within the next 3 weeks. Today's was mildly elevated at 140/82. It is okay to take Tylenol every 4 hours as needed for pain
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
## 2017-04-05 ENCOUNTER — Encounter (HOSPITAL_COMMUNITY): Payer: Self-pay | Admitting: Emergency Medicine

## 2017-04-05 ENCOUNTER — Emergency Department (HOSPITAL_COMMUNITY)
Admission: EM | Admit: 2017-04-05 | Discharge: 2017-04-05 | Disposition: A | Discharge status: Home / Self Care | Payer: Self-pay | Attending: Emergency Medicine | Admitting: Emergency Medicine

## 2017-04-05 DIAGNOSIS — J45909 Unspecified asthma, uncomplicated: Secondary | ICD-10-CM | POA: Insufficient documentation

## 2017-04-05 DIAGNOSIS — Z79899 Other long term (current) drug therapy: Secondary | ICD-10-CM | POA: Insufficient documentation

## 2017-04-05 DIAGNOSIS — H1031 Unspecified acute conjunctivitis, right eye: Secondary | ICD-10-CM | POA: Insufficient documentation

## 2017-04-05 DIAGNOSIS — F17210 Nicotine dependence, cigarettes, uncomplicated: Secondary | ICD-10-CM | POA: Insufficient documentation

## 2017-04-05 DIAGNOSIS — N342 Other urethritis: Secondary | ICD-10-CM | POA: Insufficient documentation

## 2017-04-05 MED ORDER — CETIRIZINE HCL 10 MG PO CAPS: 10.0000 mg | ORAL_CAPSULE | Freq: Two times a day (BID) | ORAL | 0 refills | Status: DC

## 2017-04-05 MED ORDER — LIDOCAINE HCL (PF) 1 % IJ SOLN
INTRAMUSCULAR | Status: AC
  Filled 2017-04-05: qty 2

## 2017-04-05 MED ORDER — CEFTRIAXONE SODIUM 250 MG IJ SOLR
250.0000 mg | Freq: Once | INTRAMUSCULAR | Status: AC
  Administered 2017-04-05: 250 mg via INTRAMUSCULAR
  Filled 2017-04-05: qty 250

## 2017-04-05 MED ORDER — AZITHROMYCIN 250 MG PO TABS
1000.0000 mg | ORAL_TABLET | Freq: Once | ORAL | Status: AC
  Administered 2017-04-05: 1000 mg via ORAL
  Filled 2017-04-05: qty 4

## 2017-04-05 MED ORDER — TOBRAMYCIN 0.3 % OP SOLN
1.0000 [drp] | OPHTHALMIC | Status: DC
  Administered 2017-04-05: 1 [drp] via OPHTHALMIC
  Filled 2017-04-05: qty 5

## 2017-04-05 NOTE — ED Triage Notes (Signed)
PT c/o right eye swelling and drainage x3 days.

## 2017-04-05 NOTE — Discharge Instructions (Signed)
Apply one drop of the antibiotic given in each eye every 4 hours for your eye infection.  Avoid rubbing your eyes as this will cause swelling.  Use the zyrtec for itch relief.  You may also use a cool washcloth which can help relieve itching.  He has been treated for gonorrhea and today.  It will take about 2 days for your  cultures to return.  If either is positive you will need to contact your partner who will also need treatment . Do not have sex for the next 7 days or until your symptoms are gone.

## 2017-04-06 LAB — GC/CHLAMYDIA PROBE AMP (~~LOC~~) NOT AT ARMC
Chlamydia: NEGATIVE
Neisseria Gonorrhea: NEGATIVE
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
## 2017-04-10 ENCOUNTER — Emergency Department (HOSPITAL_COMMUNITY)
Admission: EM | Admit: 2017-04-10 | Discharge: 2017-04-10 | Disposition: A | Discharge status: Home / Self Care | Payer: Self-pay | Attending: Emergency Medicine | Admitting: Emergency Medicine

## 2017-04-10 ENCOUNTER — Other Ambulatory Visit: Payer: Self-pay

## 2017-04-10 ENCOUNTER — Encounter (HOSPITAL_COMMUNITY): Payer: Self-pay | Admitting: *Deleted

## 2017-04-10 DIAGNOSIS — F4310 Post-traumatic stress disorder, unspecified: Secondary | ICD-10-CM

## 2017-04-10 DIAGNOSIS — F419 Anxiety disorder, unspecified: Secondary | ICD-10-CM

## 2017-04-10 DIAGNOSIS — R519 Headache, unspecified: Secondary | ICD-10-CM

## 2017-04-10 DIAGNOSIS — J45909 Unspecified asthma, uncomplicated: Secondary | ICD-10-CM | POA: Insufficient documentation

## 2017-04-10 DIAGNOSIS — F17210 Nicotine dependence, cigarettes, uncomplicated: Secondary | ICD-10-CM | POA: Insufficient documentation

## 2017-04-10 DIAGNOSIS — R51 Headache: Secondary | ICD-10-CM | POA: Insufficient documentation

## 2017-04-10 MED ORDER — IBUPROFEN 800 MG PO TABS
800.0000 mg | ORAL_TABLET | Freq: Once | ORAL | Status: AC
  Administered 2017-04-10: 800 mg via ORAL
  Filled 2017-04-10: qty 1
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
## 2017-04-10 NOTE — ED Notes (Signed)
Pt reports a headache that started 30 minutes ago. Pt denies taking anything for headache before coming to the ER.

## 2017-04-10 NOTE — ED Triage Notes (Signed)
Pt c/o headache that started x 30 mins ago; pt describes the pain as a pressure
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
## 2017-05-10 ENCOUNTER — Other Ambulatory Visit: Payer: Self-pay

## 2017-05-10 ENCOUNTER — Emergency Department (HOSPITAL_COMMUNITY): Payer: Self-pay

## 2017-05-10 ENCOUNTER — Emergency Department (HOSPITAL_COMMUNITY)
Admission: EM | Admit: 2017-05-10 | Discharge: 2017-05-10 | Disposition: A | Discharge status: Home / Self Care | Payer: Self-pay | Attending: Emergency Medicine | Admitting: Emergency Medicine

## 2017-05-10 ENCOUNTER — Encounter (HOSPITAL_COMMUNITY): Payer: Self-pay | Admitting: Cardiology

## 2017-05-10 DIAGNOSIS — F17210 Nicotine dependence, cigarettes, uncomplicated: Secondary | ICD-10-CM | POA: Insufficient documentation

## 2017-05-10 DIAGNOSIS — J069 Acute upper respiratory infection, unspecified: Secondary | ICD-10-CM | POA: Insufficient documentation

## 2017-05-10 DIAGNOSIS — J40 Bronchitis, not specified as acute or chronic: Secondary | ICD-10-CM

## 2017-05-10 DIAGNOSIS — J45909 Unspecified asthma, uncomplicated: Secondary | ICD-10-CM | POA: Insufficient documentation

## 2017-05-10 DIAGNOSIS — Z79899 Other long term (current) drug therapy: Secondary | ICD-10-CM | POA: Insufficient documentation

## 2017-05-10 DIAGNOSIS — J209 Acute bronchitis, unspecified: Secondary | ICD-10-CM | POA: Insufficient documentation

## 2017-05-10 MED ORDER — PREDNISONE 20 MG PO TABS
40.0000 mg | ORAL_TABLET | Freq: Once | ORAL | Status: AC
  Administered 2017-05-10: 40 mg via ORAL
  Filled 2017-05-10: qty 2

## 2017-05-10 MED ORDER — ONDANSETRON HCL 4 MG PO TABS
4.0000 mg | ORAL_TABLET | Freq: Once | ORAL | Status: AC
  Administered 2017-05-10: 4 mg via ORAL
  Filled 2017-05-10: qty 1

## 2017-05-10 MED ORDER — DEXAMETHASONE 4 MG PO TABS: 4.0000 mg | ORAL_TABLET | Freq: Two times a day (BID) | ORAL | 0 refills | Status: DC

## 2017-05-10 MED ORDER — ALBUTEROL SULFATE HFA 108 (90 BASE) MCG/ACT IN AERS
2.0000 | INHALATION_SPRAY | Freq: Once | RESPIRATORY_TRACT | Status: AC
  Administered 2017-05-10: 2 via RESPIRATORY_TRACT
  Filled 2017-05-10: qty 6.7
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
## 2017-05-10 NOTE — ED Notes (Signed)
Respiratory paged

## 2017-05-10 NOTE — ED Triage Notes (Signed)
C/O coughing and wheezing this morning.  States he lost his inhaler

## 2017-05-10 NOTE — Progress Notes (Signed)
**Note De-Identified Jahfari Ambers Obfuscation** Respiratory Note: No active respiratory orders at this time

## 2017-05-10 NOTE — Discharge Instructions (Signed)
Your chest xray is negative for pneumonia or acute problem. Your vital signs are within normal limits. Please increase fluids. Wash hands frequently. Use decadron 2 times daily. Use decongestant of your choice - sudafed, claritin D, allegra D, etc.. Use 2 puffs of albuterol every 4 hours as needed for difficulty breathing. See your Primary MD  for recheck. Return to the Emergency Dept if any emergent changes or problems with your breathing.

## 2017-06-07 ENCOUNTER — Other Ambulatory Visit: Payer: Self-pay

## 2017-06-07 ENCOUNTER — Emergency Department (HOSPITAL_COMMUNITY): Payer: Self-pay

## 2017-06-07 ENCOUNTER — Encounter (HOSPITAL_COMMUNITY): Payer: Self-pay | Admitting: *Deleted

## 2017-06-07 ENCOUNTER — Emergency Department (HOSPITAL_COMMUNITY)
Admission: EM | Admit: 2017-06-07 | Discharge: 2017-06-07 | Disposition: A | Discharge status: Home / Self Care | Payer: Self-pay | Attending: Emergency Medicine | Admitting: Emergency Medicine

## 2017-06-07 DIAGNOSIS — Z79899 Other long term (current) drug therapy: Secondary | ICD-10-CM | POA: Insufficient documentation

## 2017-06-07 DIAGNOSIS — J4541 Moderate persistent asthma with (acute) exacerbation: Secondary | ICD-10-CM | POA: Insufficient documentation

## 2017-06-07 DIAGNOSIS — F17210 Nicotine dependence, cigarettes, uncomplicated: Secondary | ICD-10-CM | POA: Insufficient documentation

## 2017-06-07 MED ORDER — IPRATROPIUM-ALBUTEROL 0.5-2.5 (3) MG/3ML IN SOLN
3.0000 mL | Freq: Once | RESPIRATORY_TRACT | Status: AC
  Administered 2017-06-07: 3 mL via RESPIRATORY_TRACT
  Filled 2017-06-07: qty 3

## 2017-06-07 MED ORDER — DEXAMETHASONE 4 MG PO TABS
10.0000 mg | ORAL_TABLET | Freq: Once | ORAL | Status: AC
  Administered 2017-06-07: 10 mg via ORAL
  Filled 2017-06-07: qty 3

## 2017-06-07 MED ORDER — ALBUTEROL SULFATE HFA 108 (90 BASE) MCG/ACT IN AERS
2.0000 | INHALATION_SPRAY | RESPIRATORY_TRACT | Status: DC | PRN
  Administered 2017-06-07: 2 via RESPIRATORY_TRACT
  Filled 2017-06-07 (×2): qty 6.7

## 2017-06-07 NOTE — ED Triage Notes (Signed)
Pt c/o sob that started x 1 hour ago; pt states he ran out of his inhaler

## 2017-06-07 NOTE — Discharge Instructions (Signed)
Do not smoke. °
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
## 2017-06-30 ENCOUNTER — Emergency Department (HOSPITAL_COMMUNITY)
Admission: EM | Admit: 2017-06-30 | Discharge: 2017-06-30 | Disposition: A | Discharge status: Home / Self Care | Payer: Self-pay | Attending: Emergency Medicine | Admitting: Emergency Medicine

## 2017-06-30 ENCOUNTER — Encounter (HOSPITAL_COMMUNITY): Payer: Self-pay

## 2017-06-30 ENCOUNTER — Other Ambulatory Visit: Payer: Self-pay

## 2017-06-30 DIAGNOSIS — R3121 Asymptomatic microscopic hematuria: Secondary | ICD-10-CM | POA: Insufficient documentation

## 2017-06-30 DIAGNOSIS — J069 Acute upper respiratory infection, unspecified: Secondary | ICD-10-CM | POA: Insufficient documentation

## 2017-06-30 DIAGNOSIS — J45909 Unspecified asthma, uncomplicated: Secondary | ICD-10-CM | POA: Insufficient documentation

## 2017-06-30 DIAGNOSIS — F17210 Nicotine dependence, cigarettes, uncomplicated: Secondary | ICD-10-CM | POA: Insufficient documentation

## 2017-06-30 LAB — COMPREHENSIVE METABOLIC PANEL
ALT: 13 U/L — ABNORMAL LOW (ref 17–63)
AST: 18 U/L (ref 15–41)
Albumin: 4.2 g/dL (ref 3.5–5.0)
Alkaline Phosphatase: 52 U/L (ref 38–126)
Anion gap: 10 (ref 5–15)
BUN: 15 mg/dL (ref 6–20)
CO2: 26 mmol/L (ref 22–32)
Calcium: 9.2 mg/dL (ref 8.9–10.3)
Chloride: 103 mmol/L (ref 101–111)
Creatinine, Ser: 1.03 mg/dL (ref 0.61–1.24)
GFR calc Af Amer: >60 mL/min (ref >60)
GFR calc non Af Amer: >60 mL/min (ref >60)
Glucose, Bld: 89 mg/dL (ref 65–99)
Potassium: 3.9 mmol/L (ref 3.5–5.1)
Sodium: 139 mmol/L (ref 135–145)
Total Bilirubin: 0.7 mg/dL (ref 0.3–1.2)
Total Protein: 7 g/dL (ref 6.5–8.1)

## 2017-06-30 LAB — URINALYSIS, ROUTINE W REFLEX MICROSCOPIC
Bacteria, UA: NONE SEEN
Bilirubin Urine: NEGATIVE
Glucose, UA: NEGATIVE mg/dL
Ketones, ur: NEGATIVE mg/dL
Leukocytes, UA: NEGATIVE
Nitrite: NEGATIVE
Protein, ur: NEGATIVE mg/dL
Specific Gravity, Urine: 1.024 (ref 1.005–1.030)
Squamous Epithelial / LPF: NONE SEEN
pH: 5 (ref 5.0–8.0)

## 2017-06-30 MED ORDER — LORATADINE-PSEUDOEPHEDRINE ER 5-120 MG PO TB12: 1.0000 | ORAL_TABLET | Freq: Two times a day (BID) | ORAL | 0 refills | Status: DC

## 2017-06-30 MED ORDER — DEXAMETHASONE 4 MG PO TABS: 4.0000 mg | ORAL_TABLET | Freq: Two times a day (BID) | ORAL | 0 refills | Status: DC

## 2017-06-30 MED ORDER — IBUPROFEN 400 MG PO TABS
400.0000 mg | ORAL_TABLET | Freq: Once | ORAL | Status: AC
  Administered 2017-06-30: 400 mg via ORAL
  Filled 2017-06-30: qty 1

## 2017-06-30 MED ORDER — PSEUDOEPHEDRINE HCL 60 MG PO TABS
60.0000 mg | ORAL_TABLET | Freq: Once | ORAL | Status: AC
  Administered 2017-06-30: 60 mg via ORAL
  Filled 2017-06-30: qty 1

## 2017-06-30 MED ORDER — ALBUTEROL SULFATE HFA 108 (90 BASE) MCG/ACT IN AERS
2.0000 | INHALATION_SPRAY | Freq: Once | RESPIRATORY_TRACT | Status: AC
  Administered 2017-06-30: 2 via RESPIRATORY_TRACT
  Filled 2017-06-30: qty 6.7
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
## 2017-06-30 NOTE — Discharge Instructions (Signed)
There is a small amount of microscopic blood in your urine.  Please increase your water, juices, Gatorade, etc.  If your urine continues to remain discolored, please call the [facility] noted above.  You have an upper respiratory infection.  Please use Claritin D and Decadron 2 times daily.  May use albuterol every 4 hours if needed for wheezing or excessive cough.  Please return to the emergency department if any changes, problems, or concerns.

## 2017-06-30 NOTE — ED Triage Notes (Signed)
Pt reports woke up this morning and voided and noticed his urine was dark.  Denies any pain or discharge.  Reports has voided since then and his urine has been back to normal.  Reports had taken 3 doses of otc cough medication during the night to help suppress a cough that started after being with some friends that smoke.

## 2017-07-29 ENCOUNTER — Encounter (HOSPITAL_COMMUNITY): Payer: Self-pay

## 2017-07-29 ENCOUNTER — Emergency Department (HOSPITAL_COMMUNITY)
Admission: EM | Admit: 2017-07-29 | Discharge: 2017-07-29 | Disposition: A | Discharge status: Home / Self Care | Payer: Medicaid Other | Attending: Emergency Medicine | Admitting: Emergency Medicine

## 2017-07-29 DIAGNOSIS — F17210 Nicotine dependence, cigarettes, uncomplicated: Secondary | ICD-10-CM | POA: Insufficient documentation

## 2017-07-29 DIAGNOSIS — Z202 Contact with and (suspected) exposure to infections with a predominantly sexual mode of transmission: Secondary | ICD-10-CM | POA: Insufficient documentation

## 2017-07-29 DIAGNOSIS — F419 Anxiety disorder, unspecified: Secondary | ICD-10-CM | POA: Insufficient documentation

## 2017-07-29 LAB — RPR: RPR Ser Ql: NONREACTIVE

## 2017-07-29 LAB — HIV ANTIBODY (ROUTINE TESTING W REFLEX): HIV Screen 4th Generation wRfx: NONREACTIVE

## 2017-07-29 NOTE — Discharge Instructions (Signed)
Your symptoms of congestion in your throat are not causing any compromise to your health, and as they are improved you can be discharged home.   Please go to the Health Department for further STD testing unless there is any emergency health need. Routine testing for STD

## 2017-07-29 NOTE — ED Notes (Addendum)
Patient aware that we need a urine specimen 

## 2017-07-29 NOTE — ED Notes (Signed)
Patient is resting comfortably. 
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
## 2017-12-11 ENCOUNTER — Emergency Department (HOSPITAL_COMMUNITY)
Admission: EM | Admit: 2017-12-11 | Discharge: 2017-12-11 | Disposition: A | Discharge status: Home / Self Care | Payer: Medicaid Other | Attending: Emergency Medicine | Admitting: Emergency Medicine

## 2017-12-11 ENCOUNTER — Emergency Department (HOSPITAL_COMMUNITY): Payer: Medicaid Other

## 2017-12-11 ENCOUNTER — Encounter (HOSPITAL_COMMUNITY): Payer: Self-pay | Admitting: Emergency Medicine

## 2017-12-11 DIAGNOSIS — F17210 Nicotine dependence, cigarettes, uncomplicated: Secondary | ICD-10-CM | POA: Insufficient documentation

## 2017-12-11 DIAGNOSIS — R079 Chest pain, unspecified: Secondary | ICD-10-CM

## 2017-12-11 DIAGNOSIS — R0789 Other chest pain: Secondary | ICD-10-CM | POA: Insufficient documentation

## 2017-12-11 DIAGNOSIS — J45909 Unspecified asthma, uncomplicated: Secondary | ICD-10-CM | POA: Insufficient documentation

## 2017-12-11 LAB — BASIC METABOLIC PANEL
Anion gap: 9 (ref 5–15)
BUN: 14 mg/dL (ref 6–20)
CO2: 26 mmol/L (ref 22–32)
Calcium: 9.7 mg/dL (ref 8.9–10.3)
Chloride: 104 mmol/L (ref 98–111)
Creatinine, Ser: 0.94 mg/dL (ref 0.61–1.24)
GFR calc Af Amer: >60 mL/min (ref >60)
GFR calc non Af Amer: >60 mL/min (ref >60)
Glucose, Bld: 86 mg/dL (ref 70–99)
Potassium: 3.2 mmol/L — ABNORMAL LOW (ref 3.5–5.1)
Sodium: 139 mmol/L (ref 135–145)

## 2017-12-11 LAB — CBC
HCT: 47.9 % (ref 39.0–52.0)
Hemoglobin: 16.6 g/dL (ref 13.0–17.0)
MCH: 29.9 pg (ref 26.0–34.0)
MCHC: 34.7 g/dL (ref 30.0–36.0)
MCV: 86.3 fL (ref 78.0–100.0)
Platelets: 279 10*3/uL (ref 150–400)
RBC: 5.55 MIL/uL (ref 4.22–5.81)
RDW: 12 % (ref 11.5–15.5)
WBC: 5.1 10*3/uL (ref 4.0–10.5)

## 2017-12-11 LAB — I-STAT TROPONIN, ED: Troponin i, poc: 0.03 ng/mL (ref 0.00–0.08)

## 2017-12-11 MED ORDER — POTASSIUM CHLORIDE CRYS ER 20 MEQ PO TBCR
40.0000 meq | EXTENDED_RELEASE_TABLET | Freq: Once | ORAL | Status: AC
  Administered 2017-12-11: 40 meq via ORAL
  Filled 2017-12-11: qty 2

## 2017-12-11 NOTE — ED Notes (Signed)
ED provider at bedside.
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
## 2017-12-11 NOTE — ED Notes (Signed)
Patient left at this time with all belongings. 

## 2017-12-11 NOTE — ED Triage Notes (Signed)
Pt reports sudden onset of left sided chest pain without radiation. States he was lifting weights earlier today. Denies any nausea. Resp e/u, nad.

## 2017-12-20 ENCOUNTER — Encounter (HOSPITAL_COMMUNITY): Payer: Self-pay | Admitting: Emergency Medicine

## 2017-12-20 ENCOUNTER — Other Ambulatory Visit: Payer: Self-pay

## 2017-12-20 ENCOUNTER — Emergency Department (HOSPITAL_COMMUNITY): Payer: Self-pay

## 2017-12-20 ENCOUNTER — Emergency Department (HOSPITAL_COMMUNITY)
Admission: EM | Admit: 2017-12-20 | Discharge: 2017-12-20 | Disposition: A | Discharge status: Home / Self Care | Payer: Self-pay | Attending: Emergency Medicine | Admitting: Emergency Medicine

## 2017-12-20 DIAGNOSIS — F17210 Nicotine dependence, cigarettes, uncomplicated: Secondary | ICD-10-CM | POA: Insufficient documentation

## 2017-12-20 DIAGNOSIS — B9789 Other viral agents as the cause of diseases classified elsewhere: Secondary | ICD-10-CM

## 2017-12-20 DIAGNOSIS — J45909 Unspecified asthma, uncomplicated: Secondary | ICD-10-CM | POA: Insufficient documentation

## 2017-12-20 DIAGNOSIS — J069 Acute upper respiratory infection, unspecified: Secondary | ICD-10-CM | POA: Insufficient documentation

## 2017-12-20 LAB — GROUP A STREP BY PCR: Group A Strep by PCR: NOT DETECTED

## 2017-12-20 MED ORDER — IBUPROFEN 600 MG PO TABS: 600.0000 mg | ORAL_TABLET | Freq: Four times a day (QID) | ORAL | 0 refills | Status: DC | PRN

## 2017-12-20 MED ORDER — BENZONATATE 100 MG PO CAPS: 200.0000 mg | ORAL_CAPSULE | Freq: Three times a day (TID) | ORAL | 0 refills | Status: DC | PRN

## 2017-12-20 NOTE — ED Triage Notes (Signed)
Pt c/o sore throat, nasal congestion, and otalgia that began this morning. Pt took nyquil a few hours ago with no relief.

## 2017-12-20 NOTE — Discharge Instructions (Signed)
Rest,  Drink plenty of fluids.  Take motrin or tylenol for sore throat symptoms.  Your strep test and chest xray are both normal with no signs of strep throat or a lung infection requiring antibiotics.   Get rechecked for increased shortness of breath,  Increased fever or increasing weakness or any other new symptoms.  Your illness appears to from a virus which should run its course and resolve, these usually take about a week to feel better.
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
## 2017-12-21 DIAGNOSIS — J45901 Unspecified asthma with (acute) exacerbation: Secondary | ICD-10-CM | POA: Insufficient documentation

## 2017-12-21 DIAGNOSIS — J069 Acute upper respiratory infection, unspecified: Secondary | ICD-10-CM | POA: Insufficient documentation

## 2017-12-21 DIAGNOSIS — R05 Cough: Secondary | ICD-10-CM | POA: Insufficient documentation

## 2017-12-21 DIAGNOSIS — F17210 Nicotine dependence, cigarettes, uncomplicated: Secondary | ICD-10-CM | POA: Insufficient documentation

## 2017-12-22 ENCOUNTER — Emergency Department (HOSPITAL_COMMUNITY)
Admission: EM | Admit: 2017-12-22 | Discharge: 2017-12-22 | Disposition: A | Discharge status: Home / Self Care | Payer: Medicaid Other | Attending: Emergency Medicine | Admitting: Emergency Medicine

## 2017-12-22 ENCOUNTER — Other Ambulatory Visit: Payer: Self-pay

## 2017-12-22 ENCOUNTER — Encounter (HOSPITAL_COMMUNITY): Payer: Self-pay | Admitting: *Deleted

## 2017-12-22 DIAGNOSIS — J9801 Acute bronchospasm: Secondary | ICD-10-CM

## 2017-12-22 DIAGNOSIS — J069 Acute upper respiratory infection, unspecified: Secondary | ICD-10-CM

## 2017-12-22 DIAGNOSIS — B9789 Other viral agents as the cause of diseases classified elsewhere: Secondary | ICD-10-CM

## 2017-12-22 MED ORDER — PREDNISONE 50 MG PO TABS: 50.0000 mg | ORAL_TABLET | Freq: Every day | ORAL | 0 refills | Status: DC

## 2017-12-22 MED ORDER — IPRATROPIUM-ALBUTEROL 0.5-2.5 (3) MG/3ML IN SOLN
3.0000 mL | Freq: Once | RESPIRATORY_TRACT | Status: AC
  Administered 2017-12-22: 3 mL via RESPIRATORY_TRACT

## 2017-12-22 MED ORDER — PREDNISONE 50 MG PO TABS
60.0000 mg | ORAL_TABLET | Freq: Once | ORAL | Status: AC
  Administered 2017-12-22: 60 mg via ORAL
  Filled 2017-12-22: qty 1

## 2017-12-22 MED ORDER — ACETAMINOPHEN 325 MG PO TABS
650.0000 mg | ORAL_TABLET | Freq: Once | ORAL | Status: AC
  Administered 2017-12-22: 650 mg via ORAL
  Filled 2017-12-22: qty 2

## 2017-12-22 MED ORDER — ALBUTEROL SULFATE HFA 108 (90 BASE) MCG/ACT IN AERS
2.0000 | INHALATION_SPRAY | RESPIRATORY_TRACT | Status: DC | PRN
  Administered 2017-12-22: 2 via RESPIRATORY_TRACT
  Filled 2017-12-22: qty 6.7
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
## 2017-12-22 NOTE — Discharge Instructions (Signed)
Use the inhaler - two puffs, every four hours, as needed, for cough or shortness of breath.  Take acetaminophen or ibuprofen as needed for fever or aching.

## 2017-12-22 NOTE — ED Triage Notes (Signed)
Pt c/o sore throat, chills, nasal congestion and bilateral ear pain that started a couple of days ago; pt also c/o headache and productive cough; pt states he feels "winded" and feels like he may need a breathing treatment

## 2018-01-20 ENCOUNTER — Other Ambulatory Visit: Payer: Self-pay

## 2018-01-20 ENCOUNTER — Encounter (HOSPITAL_COMMUNITY): Payer: Self-pay | Admitting: Emergency Medicine

## 2018-01-20 ENCOUNTER — Emergency Department (HOSPITAL_COMMUNITY)
Admission: EM | Admit: 2018-01-20 | Discharge: 2018-01-20 | Disposition: A | Discharge status: Home / Self Care | Payer: Self-pay | Attending: Emergency Medicine | Admitting: Emergency Medicine

## 2018-01-20 ENCOUNTER — Emergency Department (HOSPITAL_COMMUNITY): Payer: Self-pay

## 2018-01-20 DIAGNOSIS — Y998 Other external cause status: Secondary | ICD-10-CM | POA: Insufficient documentation

## 2018-01-20 DIAGNOSIS — M20011 Mallet finger of right finger(s): Secondary | ICD-10-CM | POA: Insufficient documentation

## 2018-01-20 DIAGNOSIS — Y929 Unspecified place or not applicable: Secondary | ICD-10-CM | POA: Insufficient documentation

## 2018-01-20 DIAGNOSIS — S0083XA Contusion of other part of head, initial encounter: Secondary | ICD-10-CM | POA: Insufficient documentation

## 2018-01-20 DIAGNOSIS — J45909 Unspecified asthma, uncomplicated: Secondary | ICD-10-CM | POA: Insufficient documentation

## 2018-01-20 DIAGNOSIS — F17210 Nicotine dependence, cigarettes, uncomplicated: Secondary | ICD-10-CM | POA: Insufficient documentation

## 2018-01-20 DIAGNOSIS — Y939 Activity, unspecified: Secondary | ICD-10-CM | POA: Insufficient documentation

## 2018-01-20 MED ORDER — HYDROCODONE-ACETAMINOPHEN 5-325 MG PO TABS
1.0000 | ORAL_TABLET | Freq: Once | ORAL | Status: AC
  Administered 2018-01-20: 1 via ORAL
  Filled 2018-01-20: qty 1

## 2018-01-20 MED ORDER — NAPROXEN 500 MG PO TABS: 500.0000 mg | ORAL_TABLET | Freq: Two times a day (BID) | ORAL | 0 refills | Status: DC
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
## 2018-02-02 ENCOUNTER — Emergency Department (HOSPITAL_COMMUNITY): Payer: Self-pay

## 2018-02-02 ENCOUNTER — Encounter (HOSPITAL_COMMUNITY): Payer: Self-pay

## 2018-02-02 ENCOUNTER — Other Ambulatory Visit: Payer: Self-pay

## 2018-02-02 ENCOUNTER — Emergency Department (HOSPITAL_COMMUNITY)
Admission: EM | Admit: 2018-02-02 | Discharge: 2018-02-02 | Disposition: A | Discharge status: Home / Self Care | Payer: Self-pay | Attending: Emergency Medicine | Admitting: Emergency Medicine

## 2018-02-02 DIAGNOSIS — J209 Acute bronchitis, unspecified: Secondary | ICD-10-CM | POA: Insufficient documentation

## 2018-02-02 DIAGNOSIS — Z87891 Personal history of nicotine dependence: Secondary | ICD-10-CM | POA: Insufficient documentation

## 2018-02-02 DIAGNOSIS — J45909 Unspecified asthma, uncomplicated: Secondary | ICD-10-CM | POA: Insufficient documentation

## 2018-02-02 DIAGNOSIS — Z79899 Other long term (current) drug therapy: Secondary | ICD-10-CM | POA: Insufficient documentation

## 2018-02-02 MED ORDER — PREDNISONE 20 MG PO TABS
40.0000 mg | ORAL_TABLET | Freq: Once | ORAL | Status: AC
  Administered 2018-02-02: 40 mg via ORAL
  Filled 2018-02-02: qty 2

## 2018-02-02 MED ORDER — AZITHROMYCIN 250 MG PO TABS
500.0000 mg | ORAL_TABLET | Freq: Once | ORAL | Status: AC
  Administered 2018-02-02: 500 mg via ORAL
  Filled 2018-02-02: qty 2

## 2018-02-02 MED ORDER — ALBUTEROL SULFATE (2.5 MG/3ML) 0.083% IN NEBU
2.5000 mg | INHALATION_SOLUTION | Freq: Once | RESPIRATORY_TRACT | Status: AC
Start: 2018-02-02 — End: 2018-02-02
  Administered 2018-02-02: 2.5 mg via RESPIRATORY_TRACT
  Filled 2018-02-02: qty 3

## 2018-02-02 MED ORDER — DEXAMETHASONE 4 MG PO TABS: 4.0000 mg | ORAL_TABLET | Freq: Two times a day (BID) | ORAL | 0 refills | Status: DC

## 2018-02-02 MED ORDER — ALBUTEROL SULFATE HFA 108 (90 BASE) MCG/ACT IN AERS
2.0000 | INHALATION_SPRAY | Freq: Once | RESPIRATORY_TRACT | Status: AC
  Administered 2018-02-02: 2 via RESPIRATORY_TRACT
  Filled 2018-02-02: qty 6.7

## 2018-02-02 MED ORDER — AZITHROMYCIN 250 MG PO TABS: ORAL_TABLET | ORAL | 0 refills | Status: DC

## 2018-02-02 NOTE — Discharge Instructions (Addendum)
89% on room air.  Your chest x-ray is negative for acute pneumonia or other lung related problems.  Your examination favors acute bronchitis.  Please use Tylenol every 4 hours or ibuprofen every 6 hours for fever, and/or aching.  Please use 2 puffs of albuterol every 4 hours for wheezing and cough.  Please use Zithromax 1 daily starting September 5.  Please use Decadron 2 times daily with food until tight all taken.  Please see your primary physician or return to the emergency department if not improving.  It is important that you increase fluids.  Wash hands frequently.

## 2018-02-02 NOTE — ED Notes (Signed)
Patient transported to X-ray 
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
## 2018-02-02 NOTE — ED Notes (Signed)
Pt returned from xray

## 2018-02-02 NOTE — ED Triage Notes (Signed)
Pt reports productive cough with yellow sputum since weed eating approx 1 week ago.  Reports using his inhaler frequently.

## 2018-02-23 ENCOUNTER — Other Ambulatory Visit: Payer: Self-pay

## 2018-02-23 ENCOUNTER — Emergency Department (HOSPITAL_COMMUNITY)
Admission: EM | Admit: 2018-02-23 | Discharge: 2018-02-23 | Disposition: A | Discharge status: Home / Self Care | Payer: Self-pay | Attending: Emergency Medicine | Admitting: Emergency Medicine

## 2018-02-23 ENCOUNTER — Encounter (HOSPITAL_COMMUNITY): Payer: Self-pay

## 2018-02-23 ENCOUNTER — Emergency Department (HOSPITAL_COMMUNITY): Payer: Self-pay

## 2018-02-23 DIAGNOSIS — Z87891 Personal history of nicotine dependence: Secondary | ICD-10-CM | POA: Insufficient documentation

## 2018-02-23 DIAGNOSIS — Z79899 Other long term (current) drug therapy: Secondary | ICD-10-CM | POA: Insufficient documentation

## 2018-02-23 DIAGNOSIS — J4541 Moderate persistent asthma with (acute) exacerbation: Secondary | ICD-10-CM | POA: Insufficient documentation

## 2018-02-23 MED ORDER — ALBUTEROL SULFATE HFA 108 (90 BASE) MCG/ACT IN AERS
2.0000 | INHALATION_SPRAY | Freq: Once | RESPIRATORY_TRACT | Status: AC
  Administered 2018-02-23: 2 via RESPIRATORY_TRACT
  Filled 2018-02-23: qty 6.7

## 2018-02-23 MED ORDER — ALBUTEROL SULFATE (2.5 MG/3ML) 0.083% IN NEBU
5.0000 mg | INHALATION_SOLUTION | Freq: Once | RESPIRATORY_TRACT | Status: AC
  Administered 2018-02-23: 5 mg via RESPIRATORY_TRACT
  Filled 2018-02-23: qty 6

## 2018-02-23 MED ORDER — DEXAMETHASONE SODIUM PHOSPHATE 10 MG/ML IJ SOLN
10.0000 mg | Freq: Once | INTRAMUSCULAR | Status: AC
  Administered 2018-02-23: 10 mg via INTRAMUSCULAR
  Filled 2018-02-23: qty 1
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
## 2018-02-23 NOTE — Discharge Instructions (Addendum)
Your vital signs are within normal limits.  Your oxygen level is 96% on room air.  Your breathing and wheezing seem to have responded nicely to the breathing treatment.  You were treated in the emergency department with intramuscular Decadron.  Please use your inhaler 2 puffs every 4 hours.  Please use the decongesting medication (Afrin) or decongesting medication of your choice for the nasal congestion.  Please see your primary physician or return to the emergency department if any worsening of your condition.

## 2018-05-01 ENCOUNTER — Emergency Department (HOSPITAL_COMMUNITY)
Admission: EM | Admit: 2018-05-01 | Discharge: 2018-05-01 | Disposition: A | Discharge status: Home / Self Care | Payer: Self-pay | Attending: Emergency Medicine | Admitting: Emergency Medicine

## 2018-05-01 ENCOUNTER — Emergency Department (HOSPITAL_COMMUNITY): Payer: Self-pay

## 2018-05-01 ENCOUNTER — Encounter (HOSPITAL_COMMUNITY): Payer: Self-pay

## 2018-05-01 DIAGNOSIS — Y939 Activity, unspecified: Secondary | ICD-10-CM | POA: Insufficient documentation

## 2018-05-01 DIAGNOSIS — Y929 Unspecified place or not applicable: Secondary | ICD-10-CM | POA: Insufficient documentation

## 2018-05-01 DIAGNOSIS — Z87891 Personal history of nicotine dependence: Secondary | ICD-10-CM | POA: Insufficient documentation

## 2018-05-01 DIAGNOSIS — S63502A Unspecified sprain of left wrist, initial encounter: Secondary | ICD-10-CM | POA: Insufficient documentation

## 2018-05-01 DIAGNOSIS — S01511A Laceration without foreign body of lip, initial encounter: Secondary | ICD-10-CM | POA: Insufficient documentation

## 2018-05-01 DIAGNOSIS — J45909 Unspecified asthma, uncomplicated: Secondary | ICD-10-CM | POA: Insufficient documentation

## 2018-05-01 DIAGNOSIS — S62666A Nondisplaced fracture of distal phalanx of right little finger, initial encounter for closed fracture: Secondary | ICD-10-CM | POA: Insufficient documentation

## 2018-05-01 DIAGNOSIS — Z79899 Other long term (current) drug therapy: Secondary | ICD-10-CM | POA: Insufficient documentation

## 2018-05-01 DIAGNOSIS — S0083XA Contusion of other part of head, initial encounter: Secondary | ICD-10-CM

## 2018-05-01 DIAGNOSIS — Y999 Unspecified external cause status: Secondary | ICD-10-CM | POA: Insufficient documentation

## 2018-05-01 MED ORDER — LIDOCAINE-EPINEPHRINE (PF) 2 %-1:200000 IJ SOLN
10.0000 mL | Freq: Once | INTRAMUSCULAR | Status: AC
  Administered 2018-05-01: 10 mL
  Filled 2018-05-01: qty 10

## 2018-05-01 MED ORDER — TETANUS-DIPHTH-ACELL PERTUSSIS 5-2.5-18.5 LF-MCG/0.5 IM SUSP
0.5000 mL | Freq: Once | INTRAMUSCULAR | Status: DC
  Filled 2018-05-01: qty 0.5

## 2018-05-01 MED ORDER — BACITRACIN ZINC 500 UNIT/GM EX OINT: TOPICAL_OINTMENT | Freq: Once | CUTANEOUS | Status: DC

## 2018-05-01 MED ORDER — IBUPROFEN 400 MG PO TABS
400.0000 mg | ORAL_TABLET | Freq: Once | ORAL | Status: AC
  Administered 2018-05-01: 400 mg via ORAL
  Filled 2018-05-01: qty 1
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
## 2018-05-01 NOTE — ED Provider Notes (Signed)
..  Laceration Repair Date/Time: 05/01/2018 6:24 AM Performed by: Anselm PancoastJoy, Shawn C, PA-C Authorized by: Anselm PancoastJoy, Shawn C, PA-C   Consent:    Consent obtained:  Verbal   Consent given by:  Patient   Risks discussed:  Infection, need for additional repair, poor cosmetic result, pain and poor wound healing Anesthesia (see MAR for exact dosages):    Anesthesia method:  Local infiltration   Local anesthetic:  Lidocaine 2% WITH epi Laceration details:    Location:  Lip   Lip location:  Upper exterior lip   Length (cm):  0.5 Repair type:    Repair type:  Simple Treatment:    Area cleansed with:  Saline and Betadine   Amount of cleaning:  Standard   Irrigation solution:  Sterile saline   Irrigation method:  Syringe Skin repair:    Repair method:  Sutures   Suture size:  5-0   Wound skin closure material used: Vicryl Rapide.   Suture technique: Buried simple interrupted.   Number of sutures:  2 Approximation:    Approximation:  Close Post-procedure details:    Dressing:  Antibiotic ointment   Patient tolerance of procedure:  Tolerated well, no immediate complications      Concepcion LivingJoy, Shawn C, PA-C 05/01/18 0736    Dione BoozeGlick, David, MD 05/01/18 [phone]
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
## 2018-05-01 NOTE — ED Notes (Signed)
Pt refusing to go to a hallway bed and also refusing to stay in room  Pt standing at nurses station cursing. Security at bedside.

## 2018-05-01 NOTE — ED Notes (Signed)
Pt stable, ambulatory, and verbalizes understanding of d/c instructions.  

## 2018-05-01 NOTE — Discharge Instructions (Addendum)
Apply ice to sore areas as needed - 30 minutes at a time, up to four times a day.  Wear the finger splint until you see the hand specialist.  Wear the wrist splint as needed.  Take ibuprofen or naproxen as needed for pain. For additional pain relief, you can add acetaminophen - it works in a different way, and the combination gives you additional pain relief over ibuprofen or naproxen alone.

## 2018-05-01 NOTE — ED Notes (Signed)
Patient transported to X-ray 

## 2018-05-01 NOTE — ED Notes (Signed)
Pt states his pain level is "none of my fucking business".

## 2018-05-01 NOTE — ED Notes (Signed)
Ortho tech paged in regards to wrist splint and static finger splint.

## 2018-05-11 ENCOUNTER — Other Ambulatory Visit: Payer: Self-pay

## 2018-05-11 ENCOUNTER — Emergency Department (HOSPITAL_COMMUNITY)
Admission: EM | Admit: 2018-05-11 | Discharge: 2018-05-11 | Disposition: A | Discharge status: Home / Self Care | Payer: Medicaid Other | Attending: Emergency Medicine | Admitting: Emergency Medicine

## 2018-05-11 ENCOUNTER — Encounter (HOSPITAL_COMMUNITY): Payer: Self-pay | Admitting: Emergency Medicine

## 2018-05-11 DIAGNOSIS — Z760 Encounter for issue of repeat prescription: Secondary | ICD-10-CM

## 2018-05-11 DIAGNOSIS — Y9389 Activity, other specified: Secondary | ICD-10-CM | POA: Insufficient documentation

## 2018-05-11 DIAGNOSIS — S0990XA Unspecified injury of head, initial encounter: Secondary | ICD-10-CM

## 2018-05-11 DIAGNOSIS — J45909 Unspecified asthma, uncomplicated: Secondary | ICD-10-CM | POA: Insufficient documentation

## 2018-05-11 DIAGNOSIS — S098XXA Other specified injuries of head, initial encounter: Secondary | ICD-10-CM | POA: Insufficient documentation

## 2018-05-11 DIAGNOSIS — Y999 Unspecified external cause status: Secondary | ICD-10-CM | POA: Insufficient documentation

## 2018-05-11 DIAGNOSIS — W2209XA Striking against other stationary object, initial encounter: Secondary | ICD-10-CM | POA: Insufficient documentation

## 2018-05-11 DIAGNOSIS — Y929 Unspecified place or not applicable: Secondary | ICD-10-CM | POA: Insufficient documentation

## 2018-05-11 DIAGNOSIS — Z87891 Personal history of nicotine dependence: Secondary | ICD-10-CM | POA: Insufficient documentation

## 2018-05-11 MED ORDER — IBUPROFEN 600 MG PO TABS: 600.0000 mg | ORAL_TABLET | Freq: Four times a day (QID) | ORAL | 0 refills | Status: DC | PRN

## 2018-05-11 MED ORDER — IBUPROFEN 800 MG PO TABS
800.0000 mg | ORAL_TABLET | Freq: Once | ORAL | Status: AC
  Administered 2018-05-11: 800 mg via ORAL
  Filled 2018-05-11: qty 1

## 2018-05-11 MED ORDER — ALBUTEROL SULFATE HFA 108 (90 BASE) MCG/ACT IN AERS: 1.0000 | INHALATION_SPRAY | Freq: Four times a day (QID) | RESPIRATORY_TRACT | 0 refills | Status: DC | PRN

## 2018-05-11 MED ORDER — ALBUTEROL SULFATE HFA 108 (90 BASE) MCG/ACT IN AERS
2.0000 | INHALATION_SPRAY | RESPIRATORY_TRACT | Status: DC | PRN
  Filled 2018-05-11: qty 6.7

## 2018-05-11 NOTE — ED Triage Notes (Signed)
Pt states he hit the back of his head on the ceiling trying to get onto upper bunk bed.

## 2018-05-11 NOTE — Discharge Instructions (Addendum)
Your exam tonight is stable with no concerns for serious head injuries.  You may take the medicine if needed for persistent headache.  Refer to the instructions outlined below and return here if you develop any new or worsening symptoms.
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
## 2018-05-13 ENCOUNTER — Other Ambulatory Visit: Payer: Self-pay

## 2018-05-13 ENCOUNTER — Encounter (HOSPITAL_COMMUNITY): Payer: Self-pay | Admitting: General Practice

## 2018-05-13 ENCOUNTER — Encounter (HOSPITAL_COMMUNITY)
Admission: RE | Disposition: A | Discharge status: Home / Self Care | Payer: Self-pay | Source: Home / Self Care | Attending: Orthopedic Surgery

## 2018-05-13 ENCOUNTER — Ambulatory Visit (HOSPITAL_COMMUNITY)
Admission: RE | Admit: 2018-05-13 | Discharge: 2018-05-13 | Disposition: A | Discharge status: Home / Self Care | Payer: Self-pay | Attending: Orthopedic Surgery | Admitting: Orthopedic Surgery

## 2018-05-13 ENCOUNTER — Other Ambulatory Visit: Payer: Self-pay | Admitting: Orthopedic Surgery

## 2018-05-13 ENCOUNTER — Inpatient Hospital Stay (HOSPITAL_COMMUNITY): Payer: Self-pay | Admitting: Anesthesiology

## 2018-05-13 DIAGNOSIS — Z881 Allergy status to other antibiotic agents status: Secondary | ICD-10-CM | POA: Insufficient documentation

## 2018-05-13 DIAGNOSIS — Z79899 Other long term (current) drug therapy: Secondary | ICD-10-CM | POA: Insufficient documentation

## 2018-05-13 DIAGNOSIS — S62636A Displaced fracture of distal phalanx of right little finger, initial encounter for closed fracture: Secondary | ICD-10-CM | POA: Insufficient documentation

## 2018-05-13 DIAGNOSIS — Z87891 Personal history of nicotine dependence: Secondary | ICD-10-CM | POA: Insufficient documentation

## 2018-05-13 DIAGNOSIS — J45909 Unspecified asthma, uncomplicated: Secondary | ICD-10-CM | POA: Insufficient documentation

## 2018-05-13 DIAGNOSIS — X58XXXA Exposure to other specified factors, initial encounter: Secondary | ICD-10-CM | POA: Insufficient documentation

## 2018-05-13 HISTORY — DX: Anxiety disorder, unspecified: F41.9

## 2018-05-13 HISTORY — PX: CLOSED REDUCTION FINGER WITH PERCUTANEOUS PINNING: SHX5612

## 2018-05-13 LAB — HEMOGLOBIN: Hemoglobin: 16.8 g/dL (ref 13.0–17.0)

## 2018-05-13 SURGERY — CLOSED REDUCTION, FINGER, WITH PERCUTANEOUS PINNING
Anesthesia: General | Site: Finger | Laterality: Right

## 2018-05-13 MED ORDER — OXYCODONE-ACETAMINOPHEN 5-325 MG PO TABS: 1.0000 | ORAL_TABLET | ORAL | 0 refills | Status: DC | PRN

## 2018-05-13 MED ORDER — MIDAZOLAM HCL 5 MG/5ML IJ SOLN
INTRAMUSCULAR | Status: DC | PRN
  Administered 2018-05-13: 2 mg via INTRAVENOUS

## 2018-05-13 MED ORDER — MIDAZOLAM HCL 2 MG/2ML IJ SOLN
INTRAMUSCULAR | Status: AC
  Filled 2018-05-13: qty 2

## 2018-05-13 MED ORDER — LIDOCAINE 2% (20 MG/ML) 5 ML SYRINGE
INTRAMUSCULAR | Status: DC | PRN
  Administered 2018-05-13: 100 mg via INTRAVENOUS

## 2018-05-13 MED ORDER — VANCOMYCIN HCL IN DEXTROSE 1-5 GM/200ML-% IV SOLN
1000.0000 mg | INTRAVENOUS | Status: AC
  Administered 2018-05-13: 1000 mg via INTRAVENOUS
  Filled 2018-05-13: qty 200

## 2018-05-13 MED ORDER — DEXAMETHASONE SODIUM PHOSPHATE 10 MG/ML IJ SOLN
INTRAMUSCULAR | Status: DC | PRN
  Administered 2018-05-13: 10 mg via INTRAVENOUS

## 2018-05-13 MED ORDER — BUPIVACAINE HCL (PF) 0.25 % IJ SOLN
INTRAMUSCULAR | Status: AC
  Filled 2018-05-13: qty 30

## 2018-05-13 MED ORDER — FENTANYL CITRATE (PF) 100 MCG/2ML IJ SOLN
INTRAMUSCULAR | Status: DC | PRN
  Administered 2018-05-13: 200 ug via INTRAVENOUS

## 2018-05-13 MED ORDER — PROMETHAZINE HCL 25 MG/ML IJ SOLN: 6.2500 mg | INTRAMUSCULAR | Status: DC | PRN

## 2018-05-13 MED ORDER — HYDROMORPHONE HCL 1 MG/ML IJ SOLN: 0.2500 mg | INTRAMUSCULAR | Status: DC | PRN

## 2018-05-13 MED ORDER — OXYCODONE HCL 5 MG/5ML PO SOLN: 5.0000 mg | Freq: Once | ORAL | Status: AC | PRN

## 2018-05-13 MED ORDER — OXYCODONE HCL 5 MG PO TABS
5.0000 mg | ORAL_TABLET | Freq: Once | ORAL | Status: AC | PRN
  Administered 2018-05-13: 5 mg via ORAL

## 2018-05-13 MED ORDER — CHLORHEXIDINE GLUCONATE 4 % EX LIQD: 60.0000 mL | Freq: Once | CUTANEOUS | Status: DC

## 2018-05-13 MED ORDER — 0.9 % SODIUM CHLORIDE (POUR BTL) OPTIME
TOPICAL | Status: DC | PRN
  Administered 2018-05-13: 1000 mL

## 2018-05-13 MED ORDER — PROPOFOL 10 MG/ML IV BOLUS
INTRAVENOUS | Status: AC
  Filled 2018-05-13: qty 20

## 2018-05-13 MED ORDER — OXYCODONE HCL 5 MG PO TABS
ORAL_TABLET | ORAL | Status: AC
  Filled 2018-05-13: qty 1

## 2018-05-13 MED ORDER — LACTATED RINGERS IV SOLN
INTRAVENOUS | Status: DC | PRN
  Administered 2018-05-13 (×2): via INTRAVENOUS

## 2018-05-13 MED ORDER — ONDANSETRON HCL 4 MG/2ML IJ SOLN
INTRAMUSCULAR | Status: DC | PRN
  Administered 2018-05-13: 4 mg via INTRAVENOUS

## 2018-05-13 MED ORDER — BUPIVACAINE HCL (PF) 0.25 % IJ SOLN
INTRAMUSCULAR | Status: DC | PRN
  Administered 2018-05-13: 4 mL

## 2018-05-13 MED ORDER — FENTANYL CITRATE (PF) 250 MCG/5ML IJ SOLN
INTRAMUSCULAR | Status: AC
  Filled 2018-05-13: qty 5

## 2018-05-13 SURGICAL SUPPLY — 39 items
BANDAGE ACE 3X5.8 VEL STRL LF (GAUZE/BANDAGES/DRESSINGS) ×3 IMPLANT
BANDAGE ACE 4X5 VEL STRL LF (GAUZE/BANDAGES/DRESSINGS) IMPLANT
BLADE CLIPPER SURG (BLADE) IMPLANT
BNDG CMPR 9X4 STRL LF SNTH (GAUZE/BANDAGES/DRESSINGS) ×1
BNDG ESMARK 4X9 LF (GAUZE/BANDAGES/DRESSINGS) ×3 IMPLANT
BNDG GAUZE ELAST 4 BULKY (GAUZE/BANDAGES/DRESSINGS) IMPLANT
CORDS BIPOLAR (ELECTRODE) ×3 IMPLANT
COVER SURGICAL LIGHT HANDLE (MISCELLANEOUS) ×3 IMPLANT
COVER WAND RF STERILE (DRAPES) IMPLANT
CUFF TOURNIQUET SINGLE 18IN (TOURNIQUET CUFF) ×3 IMPLANT
CUFF TOURNIQUET SINGLE 24IN (TOURNIQUET CUFF) IMPLANT
DRAPE OEC MINIVIEW 54X84 (DRAPES) ×3 IMPLANT
DRAPE SURG 17X23 STRL (DRAPES) ×3 IMPLANT
GAUZE SPONGE 4X4 12PLY STRL (GAUZE/BANDAGES/DRESSINGS) ×3 IMPLANT
GAUZE XEROFORM 1X8 LF (GAUZE/BANDAGES/DRESSINGS) ×3 IMPLANT
GLOVE SURG SYN 8.0 (GLOVE) ×3 IMPLANT
GOWN STRL REUS W/ TWL LRG LVL3 (GOWN DISPOSABLE) ×4 IMPLANT
GOWN STRL REUS W/ TWL XL LVL3 (GOWN DISPOSABLE) ×1 IMPLANT
GOWN STRL REUS W/TWL LRG LVL3 (GOWN DISPOSABLE) ×12
GOWN STRL REUS W/TWL XL LVL3 (GOWN DISPOSABLE) ×3
K-WIRE DBL TROCAR .045X4 ×3 IMPLANT
KIT BASIN OR (CUSTOM PROCEDURE TRAY) ×3 IMPLANT
KIT TURNOVER KIT B (KITS) ×3 IMPLANT
KWIRE DBL TROCAR .045X4 ×1 IMPLANT
MANIFOLD NEPTUNE II (INSTRUMENTS) IMPLANT
NEEDLE HYPO 25GX1X1/2 BEV (NEEDLE) ×3 IMPLANT
NS IRRIG 1000ML POUR BTL (IV SOLUTION) ×3 IMPLANT
PACK ORTHO EXTREMITY (CUSTOM PROCEDURE TRAY) ×3 IMPLANT
PAD ARMBOARD 7.5X6 YLW CONV (MISCELLANEOUS) ×3 IMPLANT
PAD CAST 4YDX4 CTTN HI CHSV (CAST SUPPLIES) IMPLANT
PADDING CAST COTTON 4X4 STRL (CAST SUPPLIES)
SPONGE LAP 4X18 RFD (DISPOSABLE) ×3 IMPLANT
SUT ETHILON 4 0 PS 2 18 (SUTURE) IMPLANT
SYR CONTROL 10ML LL (SYRINGE) IMPLANT
TOWEL OR 17X24 6PK STRL BLUE (TOWEL DISPOSABLE) ×3 IMPLANT
TOWEL OR 17X26 10 PK STRL BLUE (TOWEL DISPOSABLE) ×3 IMPLANT
TUBE CONNECTING 12'X1/4 (SUCTIONS)
TUBE CONNECTING 12X1/4 (SUCTIONS) IMPLANT
WATER STERILE IRR 1000ML POUR (IV SOLUTION) IMPLANT

## 2018-05-13 NOTE — Transfer of Care (Signed)
Immediate Anesthesia Transfer of Care Note  Patient: Tracy [name]  Procedure(s) Performed: CLOSED REDUCTION FINGER WITH PERCUTANEOUS PINNING (Right Finger)  Patient Location: PACU  Anesthesia Type:General  Level of Consciousness: awake, alert  and oriented  Airway & Oxygen Therapy: Patient Spontanous Breathing and Patient connected to nasal cannula oxygen  Post-op Assessment: Report given to RN and Post -op Vital signs reviewed and stable  Post vital signs: Reviewed and stable  Last Vitals:  Vitals Value Taken Time  BP 141/102 05/13/2018  1:16 PM  Temp    Pulse 75 05/13/2018  1:17 PM  Resp 14 05/13/2018  1:17 PM  SpO2 100 % 05/13/2018  1:17 PM  Vitals shown include unvalidated device data.  Last Pain:  Vitals:   05/13/18 1136  TempSrc:   PainSc: 0-No pain      Patients Stated Pain Goal: 3 (05/13/18 1136)  Complications: No apparent anesthesia complications

## 2018-05-13 NOTE — Op Note (Signed)
Please see dictated report [phone]#[id number]
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
## 2018-05-13 NOTE — Op Note (Signed)
NAME: [name], Tracy H. MEDICAL RECORD [id number] ACCOUNT [id number] DATE OF BIRTH:December 20, 1995 FACILITY: [facility] LOCATION: [facility] PHYSICIAN:[name], MD  OPERATIVE REPORT  DATE OF PROCEDURE:  05/13/2018  PREOPERATIVE DIAGNOSIS:  Right small finger distal interphalangeal joint fracture dislocation.  POSTOPERATIVE DIAGNOSIS:  Right small finger distal interphalangeal  joint fracture dislocation.  PROCEDURE:  Closed reduction and pinning with 0.045 K wires x2.  SURGEON:  Dairl PonderMatthew Jerah Esty, MD  ASSISTANT:  None.  ANESTHESIA:  General.  TOURNIQUET TIME:  40 minutes.  COMPLICATIONS:  None.  DESCRIPTION OF PROCEDURE:  The patient was taken to the operating suite after induction of adequate general anesthetic, right upper extremity was prepped and draped in the usual sterile fashion.  An Esmarch was used to exsanguinate the limb.  Tourniquet  was then inflated to 250 mmHg.  At this point in time, under fluoroscopic imaging, the DIP joint was flexed and an 0.045 K-wire was used to reduce a large dorsal fragment of the extensor tendon insertion.  It was driven percutaneously through the dorsum  of the finger obliquely, proximally to maintain reduction of the fragment.  We then fully extended the tip of the finger and drove a second 0.045 K-wire across the joint to maintain the fracture dislocation.  Fluoroscopic imaging revealed adequate  induction in both views.  The K-wires were cut outside the skin and dressed with Xeroform, 4 x 4's, and a compression wrap.    The patient tolerated this procedure well and went to recovery in stable fashion.  AN/NUANCE  D:05/13/2018 T:05/13/2018 JOB:[id number]

## 2018-05-13 NOTE — Anesthesia Procedure Notes (Signed)
Procedure Name: LMA Insertion Date/Time: 05/13/2018 12:30 PM Performed by: Carmela RimaMartinelli, Zyionna Pesce F, CRNA Pre-anesthesia Checklist: Timeout performed, Patient being monitored, Suction available, Emergency Drugs available and Patient identified Patient Re-evaluated:Patient Re-evaluated prior to induction Oxygen Delivery Method: Circle system utilized Preoxygenation: Pre-oxygenation with 100% oxygen Induction Type: IV induction Ventilation: Mask ventilation without difficulty LMA: LMA inserted LMA Size: 5.0 Placement Confirmation: positive ETCO2 and breath sounds checked- equal and bilateral Tube secured with: Tape Dental Injury: Teeth and Oropharynx as per pre-operative assessment

## 2018-05-13 NOTE — Anesthesia Postprocedure Evaluation (Signed)
Anesthesia Post Note  Patient: Raelyn EnsignJames H Soler  Procedure(s) Performed: CLOSED REDUCTION FINGER WITH PERCUTANEOUS PINNING (Right Finger)     Patient location during evaluation: PACU Anesthesia Type: General Level of consciousness: awake and alert Pain management: pain level controlled Vital Signs Assessment: post-procedure vital signs reviewed and stable Respiratory status: spontaneous breathing, nonlabored ventilation, respiratory function stable and patient connected to nasal cannula oxygen Cardiovascular status: blood pressure returned to baseline and stable Postop Assessment: no apparent nausea or vomiting Anesthetic complications: no    Last Vitals:  Vitals:   05/13/18 1400 05/13/18 1415  BP: (!) 139/98 (!) 133/94  Pulse: 61 (!) 56  Resp: 11 16  Temp:    SpO2: 97% 100%    Last Pain:  Vitals:   05/13/18 1400  TempSrc:   PainSc: Asleep                 Ryan P Ellender

## 2018-05-13 NOTE — Anesthesia Preprocedure Evaluation (Addendum)
Anesthesia Evaluation  Patient identified by MRN, date of birth, ID band Patient awake    Reviewed: Allergy & Precautions, NPO status , Patient's Chart, lab work & pertinent test results  Airway Mallampati: II  TM Distance: >3 FB Neck ROM: full    Dental  (+) Teeth Intact, Dental Advidsory Given   Pulmonary asthma , former smoker,    Pulmonary exam normal        Cardiovascular negative cardio ROS Normal cardiovascular exam  ECG: NSR, rate 76   Neuro/Psych negative neurological ROS  negative psych ROS   GI/Hepatic negative GI ROS, Neg liver ROS,   Endo/Other  negative endocrine ROS  Renal/GU negative Renal ROS     Musculoskeletal negative musculoskeletal ROS (+)   Abdominal   Peds  Hematology negative hematology ROS (+)   Anesthesia Other Findings fractured right small finger  Reproductive/Obstetrics                           Anesthesia Physical Anesthesia Plan  ASA: II  Anesthesia Plan: General   Post-op Pain Management:    Induction: Intravenous  PONV Risk Score and Plan: 2 and Ondansetron, Dexamethasone, Midazolam and Treatment may vary due to age or medical condition  Airway Management Planned: LMA  Additional Equipment:   Intra-op Plan:   Post-operative Plan: Extubation in OR  Informed Consent: I have reviewed the patients History and Physical, chart, labs and discussed the procedure including the risks, benefits and alternatives for the proposed anesthesia with the patient or authorized representative who has indicated his/her understanding and acceptance.   Dental advisory given and Dental Advisory Given  Plan Discussed with: CRNA  Anesthesia Plan Comments:        Anesthesia Quick Evaluation

## 2018-05-16 ENCOUNTER — Encounter (HOSPITAL_COMMUNITY): Payer: Self-pay | Admitting: Orthopedic Surgery

## 2018-05-25 ENCOUNTER — Other Ambulatory Visit: Payer: Self-pay

## 2018-05-25 ENCOUNTER — Encounter (HOSPITAL_COMMUNITY): Payer: Self-pay | Admitting: Emergency Medicine

## 2018-05-25 ENCOUNTER — Emergency Department (HOSPITAL_COMMUNITY)
Admission: EM | Admit: 2018-05-25 | Discharge: 2018-05-25 | Disposition: A | Discharge status: Home / Self Care | Payer: Medicaid Other | Attending: Emergency Medicine | Admitting: Emergency Medicine

## 2018-05-25 DIAGNOSIS — H66011 Acute suppurative otitis media with spontaneous rupture of ear drum, right ear: Secondary | ICD-10-CM | POA: Insufficient documentation

## 2018-05-25 DIAGNOSIS — J40 Bronchitis, not specified as acute or chronic: Secondary | ICD-10-CM

## 2018-05-25 DIAGNOSIS — J45909 Unspecified asthma, uncomplicated: Secondary | ICD-10-CM | POA: Insufficient documentation

## 2018-05-25 DIAGNOSIS — Z87891 Personal history of nicotine dependence: Secondary | ICD-10-CM | POA: Insufficient documentation

## 2018-05-25 MED ORDER — PREDNISONE 20 MG PO TABS: 40.0000 mg | ORAL_TABLET | Freq: Every day | ORAL | 0 refills | Status: DC

## 2018-05-25 MED ORDER — AZITHROMYCIN 250 MG PO TABS: 250.0000 mg | ORAL_TABLET | Freq: Every day | ORAL | 0 refills | Status: DC

## 2018-05-25 NOTE — ED Notes (Signed)
Pt verbalized understanding of discharge paperwork and prescriptions.  °

## 2018-05-25 NOTE — Discharge Instructions (Addendum)
Take amoxicillin as prescribed until all gone.  Use your inhaler every 4 hours.  Take prednisone as prescribed until finished.  Follow-up for recheck especially if not improving.

## 2018-05-25 NOTE — ED Triage Notes (Signed)
Pt presents to ED from home. Pt states he woke up this morning and his right ear itches. Pt states he feels something is in his ear.
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
## 2018-06-21 ENCOUNTER — Other Ambulatory Visit: Payer: Self-pay

## 2018-06-21 ENCOUNTER — Encounter (HOSPITAL_COMMUNITY): Payer: Self-pay | Admitting: Emergency Medicine

## 2018-06-21 ENCOUNTER — Emergency Department (HOSPITAL_COMMUNITY)
Admission: EM | Admit: 2018-06-21 | Discharge: 2018-06-21 | Disposition: A | Discharge status: Home / Self Care | Payer: Medicaid Other | Attending: Emergency Medicine | Admitting: Emergency Medicine

## 2018-06-21 DIAGNOSIS — F419 Anxiety disorder, unspecified: Secondary | ICD-10-CM | POA: Insufficient documentation

## 2018-06-21 DIAGNOSIS — J40 Bronchitis, not specified as acute or chronic: Secondary | ICD-10-CM | POA: Insufficient documentation

## 2018-06-21 DIAGNOSIS — Z87891 Personal history of nicotine dependence: Secondary | ICD-10-CM | POA: Insufficient documentation

## 2018-06-21 MED ORDER — PROMETHAZINE-DM 6.25-15 MG/5ML PO SYRP: 5.0000 mL | ORAL_SOLUTION | Freq: Four times a day (QID) | ORAL | 0 refills | Status: DC | PRN

## 2018-06-21 MED ORDER — PREDNISONE 20 MG PO TABS: 40.0000 mg | ORAL_TABLET | Freq: Every day | ORAL | 0 refills | Status: DC

## 2018-06-21 MED ORDER — ALBUTEROL SULFATE HFA 108 (90 BASE) MCG/ACT IN AERS
2.0000 | INHALATION_SPRAY | Freq: Once | RESPIRATORY_TRACT | Status: AC
  Administered 2018-06-21: 2 via RESPIRATORY_TRACT
  Filled 2018-06-21: qty 6.7

## 2018-06-21 NOTE — ED Notes (Signed)
Pt called twice no answer

## 2018-06-21 NOTE — ED Triage Notes (Signed)
Pt c/o of cough and congestion 2 days ago.  Pt states his inhaler ran out yesterday

## 2018-06-21 NOTE — Discharge Instructions (Addendum)
2 puffs of your albuterol inhaler 4 times a day as needed.  The prednisone as directed until its finished.  You may also take Tylenol every 4 hours if needed for fever or body aches.  Drink plenty of fluids.  You may contact the clinic listed to establish primary care, return to the ER for any worsening symptoms.

## 2018-06-22 DIAGNOSIS — F419 Anxiety disorder, unspecified: Secondary | ICD-10-CM | POA: Insufficient documentation

## 2018-06-22 DIAGNOSIS — I10 Essential (primary) hypertension: Secondary | ICD-10-CM | POA: Insufficient documentation

## 2018-06-22 DIAGNOSIS — Z87891 Personal history of nicotine dependence: Secondary | ICD-10-CM | POA: Insufficient documentation

## 2018-06-22 DIAGNOSIS — J45909 Unspecified asthma, uncomplicated: Secondary | ICD-10-CM | POA: Insufficient documentation
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
## 2018-06-23 ENCOUNTER — Emergency Department (HOSPITAL_COMMUNITY)
Admission: EM | Admit: 2018-06-23 | Discharge: 2018-06-23 | Disposition: A | Discharge status: Home / Self Care | Payer: Medicaid Other | Attending: Emergency Medicine | Admitting: Emergency Medicine

## 2018-06-23 ENCOUNTER — Encounter (HOSPITAL_COMMUNITY): Payer: Self-pay | Admitting: *Deleted

## 2018-06-23 ENCOUNTER — Other Ambulatory Visit: Payer: Self-pay

## 2018-06-23 DIAGNOSIS — I10 Essential (primary) hypertension: Secondary | ICD-10-CM

## 2018-06-23 LAB — BASIC METABOLIC PANEL
Anion gap: 7 (ref 5–15)
BUN: 15 mg/dL (ref 6–20)
CO2: 29 mmol/L (ref 22–32)
Calcium: 8.9 mg/dL (ref 8.9–10.3)
Chloride: 103 mmol/L (ref 98–111)
Creatinine, Ser: 1 mg/dL (ref 0.61–1.24)
GFR calc Af Amer: >60 mL/min (ref >60)
GFR calc non Af Amer: >60 mL/min (ref >60)
Glucose, Bld: 105 mg/dL — ABNORMAL HIGH (ref 70–99)
Potassium: 3.6 mmol/L (ref 3.5–5.1)
Sodium: 139 mmol/L (ref 135–145)

## 2018-06-23 LAB — CBC
HCT: 46.8 % (ref 39.0–52.0)
Hemoglobin: 16.1 g/dL (ref 13.0–17.0)
MCH: 30.2 pg (ref 26.0–34.0)
MCHC: 34.4 g/dL (ref 30.0–36.0)
MCV: 87.8 fL (ref 80.0–100.0)
Platelets: 277 10*3/uL (ref 150–400)
RBC: 5.33 MIL/uL (ref 4.22–5.81)
RDW: 12.3 % (ref 11.5–15.5)
WBC: 5.1 10*3/uL (ref 4.0–10.5)
nRBC: 0 % (ref 0.0–0.2)

## 2018-06-23 MED ORDER — HYDROCHLOROTHIAZIDE 25 MG PO TABS: 12.5000 mg | ORAL_TABLET | Freq: Every day | ORAL | 3 refills | Status: DC

## 2018-06-23 NOTE — ED Triage Notes (Signed)
Pt c/o feeling different and states he is having some numbness to his left arm and hand
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
## 2018-07-24 ENCOUNTER — Inpatient Hospital Stay (HOSPITAL_COMMUNITY): Payer: Medicaid Other | Admitting: Certified Registered Nurse Anesthetist

## 2018-07-24 ENCOUNTER — Other Ambulatory Visit: Payer: Self-pay

## 2018-07-24 ENCOUNTER — Emergency Department (HOSPITAL_COMMUNITY): Payer: Medicaid Other

## 2018-07-24 ENCOUNTER — Inpatient Hospital Stay (HOSPITAL_COMMUNITY): Payer: Medicaid Other

## 2018-07-24 ENCOUNTER — Encounter (HOSPITAL_COMMUNITY): Payer: Self-pay

## 2018-07-24 ENCOUNTER — Inpatient Hospital Stay (HOSPITAL_COMMUNITY)
Admission: EM | Admit: 2018-07-24 | Discharge: 2018-07-31 | DRG: 481 | Disposition: A | Discharge status: Home / Self Care | Payer: Medicaid Other | Attending: Orthopaedic Surgery | Admitting: Orthopaedic Surgery

## 2018-07-24 ENCOUNTER — Encounter (HOSPITAL_COMMUNITY)
Admission: EM | Disposition: A | Discharge status: Home / Self Care | Payer: Self-pay | Source: Home / Self Care | Attending: Orthopaedic Surgery

## 2018-07-24 DIAGNOSIS — S71141A Puncture wound with foreign body, right thigh, initial encounter: Secondary | ICD-10-CM | POA: Diagnosis present

## 2018-07-24 DIAGNOSIS — S72309B Unspecified fracture of shaft of unspecified femur, initial encounter for open fracture type I or II: Secondary | ICD-10-CM | POA: Diagnosis present

## 2018-07-24 DIAGNOSIS — S7290XA Unspecified fracture of unspecified femur, initial encounter for closed fracture: Secondary | ICD-10-CM

## 2018-07-24 DIAGNOSIS — Y249XXA Unspecified firearm discharge, undetermined intent, initial encounter: Secondary | ICD-10-CM | POA: Diagnosis present

## 2018-07-24 DIAGNOSIS — J45909 Unspecified asthma, uncomplicated: Secondary | ICD-10-CM | POA: Diagnosis present

## 2018-07-24 DIAGNOSIS — Z419 Encounter for procedure for purposes other than remedying health state, unspecified: Secondary | ICD-10-CM

## 2018-07-24 DIAGNOSIS — Z87891 Personal history of nicotine dependence: Secondary | ICD-10-CM

## 2018-07-24 DIAGNOSIS — Z79899 Other long term (current) drug therapy: Secondary | ICD-10-CM

## 2018-07-24 DIAGNOSIS — F419 Anxiety disorder, unspecified: Secondary | ICD-10-CM | POA: Diagnosis present

## 2018-07-24 DIAGNOSIS — S7010XA Contusion of unspecified thigh, initial encounter: Secondary | ICD-10-CM | POA: Diagnosis present

## 2018-07-24 DIAGNOSIS — W3400XA Accidental discharge from unspecified firearms or gun, initial encounter: Secondary | ICD-10-CM

## 2018-07-24 DIAGNOSIS — Z880 Allergy status to penicillin: Secondary | ICD-10-CM

## 2018-07-24 DIAGNOSIS — Y9229 Other specified public building as the place of occurrence of the external cause: Secondary | ICD-10-CM

## 2018-07-24 DIAGNOSIS — Z23 Encounter for immunization: Secondary | ICD-10-CM

## 2018-07-24 DIAGNOSIS — Z825 Family history of asthma and other chronic lower respiratory diseases: Secondary | ICD-10-CM

## 2018-07-24 DIAGNOSIS — D62 Acute posthemorrhagic anemia: Secondary | ICD-10-CM | POA: Diagnosis not present

## 2018-07-24 DIAGNOSIS — F4310 Post-traumatic stress disorder, unspecified: Secondary | ICD-10-CM | POA: Diagnosis present

## 2018-07-24 DIAGNOSIS — S72351B Displaced comminuted fracture of shaft of right femur, initial encounter for open fracture type I or II: Principal | ICD-10-CM | POA: Diagnosis present

## 2018-07-24 DIAGNOSIS — R000 Tachycardia, unspecified: Secondary | ICD-10-CM | POA: Diagnosis present

## 2018-07-24 HISTORY — PX: INTRAMEDULLARY (IM) NAIL INTERTROCHANTERIC: SHX5875

## 2018-07-24 HISTORY — DX: Unspecified fracture of unspecified femur, initial encounter for closed fracture: S72.90XA

## 2018-07-24 HISTORY — DX: Unspecified fracture of shaft of unspecified femur, initial encounter for open fracture type I or II: S72.309B

## 2018-07-24 LAB — CBC
HCT: 46.8 % (ref 39.0–52.0)
Hemoglobin: 16.3 g/dL (ref 13.0–17.0)
MCH: 30.3 pg (ref 26.0–34.0)
MCHC: 34.8 g/dL (ref 30.0–36.0)
MCV: 87 fL (ref 80.0–100.0)
Platelets: 317 10*3/uL (ref 150–400)
RBC: 5.38 MIL/uL (ref 4.22–5.81)
RDW: 12.1 % (ref 11.5–15.5)
WBC: 4.7 10*3/uL (ref 4.0–10.5)
nRBC: 0 % (ref 0.0–0.2)

## 2018-07-24 LAB — URINALYSIS, ROUTINE W REFLEX MICROSCOPIC
Bacteria, UA: NONE SEEN
Bilirubin Urine: NEGATIVE
Glucose, UA: NEGATIVE mg/dL
Ketones, ur: NEGATIVE mg/dL
Leukocytes,Ua: NEGATIVE
Nitrite: NEGATIVE
Protein, ur: NEGATIVE mg/dL
Specific Gravity, Urine: 1.009 (ref 1.005–1.030)
pH: 6 (ref 5.0–8.0)

## 2018-07-24 LAB — LACTIC ACID, PLASMA: Lactic Acid, Venous: 3.7 mmol/L (ref 0.5–1.9)

## 2018-07-24 LAB — COMPREHENSIVE METABOLIC PANEL
ALT: 139 U/L — ABNORMAL HIGH (ref 0–44)
AST: 99 U/L — ABNORMAL HIGH (ref 15–41)
Albumin: 4.8 g/dL (ref 3.5–5.0)
Alkaline Phosphatase: 52 U/L (ref 38–126)
Anion gap: 12 (ref 5–15)
BUN: 12 mg/dL (ref 6–20)
CO2: 21 mmol/L — ABNORMAL LOW (ref 22–32)
Calcium: 9.5 mg/dL (ref 8.9–10.3)
Chloride: 104 mmol/L (ref 98–111)
Creatinine, Ser: 0.99 mg/dL (ref 0.61–1.24)
GFR calc Af Amer: >60 mL/min (ref >60)
GFR calc non Af Amer: >60 mL/min (ref >60)
Glucose, Bld: 164 mg/dL — ABNORMAL HIGH (ref 70–99)
Potassium: 3.3 mmol/L — ABNORMAL LOW (ref 3.5–5.1)
Sodium: 137 mmol/L (ref 135–145)
Total Bilirubin: 0.6 mg/dL (ref 0.3–1.2)
Total Protein: 7.6 g/dL (ref 6.5–8.1)

## 2018-07-24 LAB — SURGICAL PCR SCREEN
MRSA, PCR: NEGATIVE
Staphylococcus aureus: NEGATIVE

## 2018-07-24 LAB — PROTIME-INR
INR: 1.03
Prothrombin Time: 13.4 seconds (ref 11.4–15.2)

## 2018-07-24 LAB — CDS SEROLOGY

## 2018-07-24 LAB — SAMPLE TO BLOOD BANK

## 2018-07-24 LAB — GLUCOSE, CAPILLARY: Glucose-Capillary: 98 mg/dL (ref 70–99)

## 2018-07-24 LAB — ETHANOL: Alcohol, Ethyl (B): 39 mg/dL — ABNORMAL HIGH (ref <10)

## 2018-07-24 SURGERY — FIXATION, FRACTURE, INTERTROCHANTERIC, WITH INTRAMEDULLARY ROD
Anesthesia: General | Site: Hip | Laterality: Right

## 2018-07-24 MED ORDER — ROCURONIUM BROMIDE 100 MG/10ML IV SOLN
INTRAVENOUS | Status: AC
  Filled 2018-07-24: qty 1

## 2018-07-24 MED ORDER — DEXAMETHASONE SODIUM PHOSPHATE 4 MG/ML IJ SOLN
INTRAMUSCULAR | Status: DC | PRN
  Administered 2018-07-24: 10 mg via INTRAVENOUS

## 2018-07-24 MED ORDER — POLYETHYLENE GLYCOL 3350 17 G PO PACK
17.0000 g | PACK | Freq: Every day | ORAL | Status: DC | PRN
  Administered 2018-07-31: 17 g via ORAL
  Filled 2018-07-24: qty 1

## 2018-07-24 MED ORDER — CEFAZOLIN SODIUM-DEXTROSE 2-4 GM/100ML-% IV SOLN
INTRAVENOUS | Status: AC
  Filled 2018-07-24: qty 100

## 2018-07-24 MED ORDER — TRAMADOL HCL 50 MG PO TABS
50.0000 mg | ORAL_TABLET | Freq: Four times a day (QID) | ORAL | Status: DC
  Administered 2018-07-25 – 2018-07-27 (×8): 50 mg via ORAL
  Filled 2018-07-24 (×9): qty 1

## 2018-07-24 MED ORDER — TETANUS-DIPHTHERIA TOXOIDS TD 5-2 LFU IM INJ
0.5000 mL | INJECTION | Freq: Once | INTRAMUSCULAR | Status: DC
  Filled 2018-07-24: qty 0.5

## 2018-07-24 MED ORDER — DEXMEDETOMIDINE HCL IN NACL 200 MCG/50ML IV SOLN
INTRAVENOUS | Status: AC
  Filled 2018-07-24: qty 50

## 2018-07-24 MED ORDER — METHOCARBAMOL 500 MG PO TABS
500.0000 mg | ORAL_TABLET | Freq: Four times a day (QID) | ORAL | Status: DC | PRN
  Administered 2018-07-24 – 2018-07-27 (×6): 500 mg via ORAL
  Filled 2018-07-24 (×6): qty 1

## 2018-07-24 MED ORDER — SODIUM CHLORIDE 0.9 % IV BOLUS (SEPSIS)
1000.0000 mL | Freq: Once | INTRAVENOUS | Status: AC
  Administered 2018-07-24: 1000 mL via INTRAVENOUS

## 2018-07-24 MED ORDER — ONDANSETRON HCL 4 MG/2ML IJ SOLN
INTRAMUSCULAR | Status: DC | PRN
  Administered 2018-07-24: 4 mg via INTRAVENOUS

## 2018-07-24 MED ORDER — DEXAMETHASONE SODIUM PHOSPHATE 10 MG/ML IJ SOLN
INTRAMUSCULAR | Status: AC
  Filled 2018-07-24: qty 1

## 2018-07-24 MED ORDER — DIAZEPAM 2 MG PO TABS
2.0000 mg | ORAL_TABLET | Freq: Three times a day (TID) | ORAL | Status: DC | PRN
  Administered 2018-07-26: 2 mg via ORAL
  Filled 2018-07-24 (×3): qty 1

## 2018-07-24 MED ORDER — ACETAMINOPHEN 10 MG/ML IV SOLN: 1000.0000 mg | Freq: Once | INTRAVENOUS | Status: DC | PRN

## 2018-07-24 MED ORDER — ONDANSETRON HCL 4 MG/2ML IJ SOLN
INTRAMUSCULAR | Status: AC
  Filled 2018-07-24: qty 2

## 2018-07-24 MED ORDER — MEPERIDINE HCL 50 MG/ML IJ SOLN: 6.2500 mg | INTRAMUSCULAR | Status: DC | PRN

## 2018-07-24 MED ORDER — HYDROMORPHONE HCL 2 MG/ML IJ SOLN
INTRAMUSCULAR | Status: AC
  Filled 2018-07-24: qty 1

## 2018-07-24 MED ORDER — PROPOFOL 10 MG/ML IV BOLUS
INTRAVENOUS | Status: AC
  Filled 2018-07-24: qty 40

## 2018-07-24 MED ORDER — PANTOPRAZOLE SODIUM 40 MG PO TBEC
40.0000 mg | DELAYED_RELEASE_TABLET | Freq: Every day | ORAL | Status: DC
  Administered 2018-07-25 – 2018-07-31 (×7): 40 mg via ORAL
  Filled 2018-07-24 (×7): qty 1

## 2018-07-24 MED ORDER — SODIUM CHLORIDE 0.45 % IV SOLN
INTRAVENOUS | Status: DC
  Administered 2018-07-24 – 2018-07-25 (×3): via INTRAVENOUS

## 2018-07-24 MED ORDER — 0.9 % SODIUM CHLORIDE (POUR BTL) OPTIME
TOPICAL | Status: DC | PRN
  Administered 2018-07-24: 1000 mL

## 2018-07-24 MED ORDER — ACETAMINOPHEN 325 MG PO TABS
325.0000 mg | ORAL_TABLET | Freq: Four times a day (QID) | ORAL | Status: DC | PRN
  Administered 2018-07-25 – 2018-07-31 (×7): 650 mg via ORAL
  Filled 2018-07-24 (×7): qty 2

## 2018-07-24 MED ORDER — METOCLOPRAMIDE HCL 5 MG/ML IJ SOLN: 5.0000 mg | Freq: Three times a day (TID) | INTRAMUSCULAR | Status: DC | PRN

## 2018-07-24 MED ORDER — ALBUTEROL SULFATE (2.5 MG/3ML) 0.083% IN NEBU
2.5000 mg | INHALATION_SOLUTION | RESPIRATORY_TRACT | Status: DC | PRN
  Administered 2018-07-24 – 2018-07-29 (×8): 2.5 mg via RESPIRATORY_TRACT
  Filled 2018-07-24 (×7): qty 3

## 2018-07-24 MED ORDER — CEFAZOLIN SODIUM-DEXTROSE 2-4 GM/100ML-% IV SOLN
2.0000 g | INTRAVENOUS | Status: AC
  Administered 2018-07-24: 2 g via INTRAVENOUS

## 2018-07-24 MED ORDER — PROPOFOL 10 MG/ML IV BOLUS
INTRAVENOUS | Status: DC | PRN
  Administered 2018-07-24: 200 mg via INTRAVENOUS

## 2018-07-24 MED ORDER — FENTANYL CITRATE (PF) 250 MCG/5ML IJ SOLN
INTRAMUSCULAR | Status: AC
  Filled 2018-07-24: qty 5

## 2018-07-24 MED ORDER — DEXTROSE-NACL 5-0.45 % IV SOLN
INTRAVENOUS | Status: DC
  Administered 2018-07-24: 06:00:00 via INTRAVENOUS

## 2018-07-24 MED ORDER — DEXMEDETOMIDINE HCL 200 MCG/2ML IV SOLN
INTRAVENOUS | Status: DC | PRN
  Administered 2018-07-24 (×3): 8 ug via INTRAVENOUS

## 2018-07-24 MED ORDER — HYDROMORPHONE HCL 1 MG/ML IJ SOLN: 0.2500 mg | INTRAMUSCULAR | Status: DC | PRN

## 2018-07-24 MED ORDER — ACETAMINOPHEN 500 MG PO TABS
1000.0000 mg | ORAL_TABLET | Freq: Four times a day (QID) | ORAL | Status: AC
  Administered 2018-07-24 – 2018-07-25 (×3): 1000 mg via ORAL
  Filled 2018-07-24 (×3): qty 2

## 2018-07-24 MED ORDER — HYDROCODONE-ACETAMINOPHEN 7.5-325 MG PO TABS: 1.0000 | ORAL_TABLET | Freq: Once | ORAL | Status: DC | PRN

## 2018-07-24 MED ORDER — HYDROMORPHONE HCL 1 MG/ML IJ SOLN
0.5000 mg | INTRAMUSCULAR | Status: DC | PRN
  Administered 2018-07-25: 1 mg via INTRAVENOUS
  Filled 2018-07-24: qty 1

## 2018-07-24 MED ORDER — ROCURONIUM BROMIDE 10 MG/ML (PF) SYRINGE
PREFILLED_SYRINGE | INTRAVENOUS | Status: DC | PRN
  Administered 2018-07-24: 60 mg via INTRAVENOUS

## 2018-07-24 MED ORDER — LIDOCAINE 2% (20 MG/ML) 5 ML SYRINGE
INTRAMUSCULAR | Status: AC
  Filled 2018-07-24: qty 5

## 2018-07-24 MED ORDER — ALBUTEROL SULFATE (2.5 MG/3ML) 0.083% IN NEBU
2.5000 mg | INHALATION_SOLUTION | Freq: Four times a day (QID) | RESPIRATORY_TRACT | Status: DC | PRN
  Filled 2018-07-24: qty 3

## 2018-07-24 MED ORDER — DOCUSATE SODIUM 100 MG PO CAPS
100.0000 mg | ORAL_CAPSULE | Freq: Two times a day (BID) | ORAL | Status: DC
  Administered 2018-07-24 – 2018-07-30 (×13): 100 mg via ORAL
  Filled 2018-07-24 (×14): qty 1

## 2018-07-24 MED ORDER — METHOCARBAMOL 1000 MG/10ML IJ SOLN: 500.0000 mg | Freq: Four times a day (QID) | INTRAVENOUS | Status: DC | PRN

## 2018-07-24 MED ORDER — GLYCOPYRROLATE PF 0.2 MG/ML IJ SOSY
PREFILLED_SYRINGE | INTRAMUSCULAR | Status: AC
  Filled 2018-07-24: qty 1

## 2018-07-24 MED ORDER — PHENYLEPHRINE 40 MCG/ML (10ML) SYRINGE FOR IV PUSH (FOR BLOOD PRESSURE SUPPORT)
PREFILLED_SYRINGE | INTRAVENOUS | Status: DC | PRN
  Administered 2018-07-24: 80 ug via INTRAVENOUS
  Administered 2018-07-24: 120 ug via INTRAVENOUS
  Administered 2018-07-24 (×4): 80 ug via INTRAVENOUS

## 2018-07-24 MED ORDER — OXYCODONE HCL 5 MG PO TABS
5.0000 mg | ORAL_TABLET | ORAL | Status: DC | PRN
  Administered 2018-07-24: 5 mg via ORAL
  Administered 2018-07-25 – 2018-07-27 (×4): 10 mg via ORAL
  Filled 2018-07-24: qty 2
  Filled 2018-07-24: qty 1
  Filled 2018-07-24 (×3): qty 2

## 2018-07-24 MED ORDER — MORPHINE SULFATE (PF) 2 MG/ML IV SOLN
2.0000 mg | INTRAVENOUS | Status: DC | PRN
  Administered 2018-07-24: 2 mg via INTRAVENOUS
  Filled 2018-07-24: qty 1

## 2018-07-24 MED ORDER — POVIDONE-IODINE 10 % EX SWAB: 2.0000 "application " | Freq: Once | CUTANEOUS | Status: DC

## 2018-07-24 MED ORDER — ONDANSETRON HCL 4 MG/2ML IJ SOLN
4.0000 mg | Freq: Four times a day (QID) | INTRAMUSCULAR | Status: DC | PRN
  Filled 2018-07-24: qty 2

## 2018-07-24 MED ORDER — PHENYLEPHRINE 40 MCG/ML (10ML) SYRINGE FOR IV PUSH (FOR BLOOD PRESSURE SUPPORT)
PREFILLED_SYRINGE | INTRAVENOUS | Status: AC
  Filled 2018-07-24: qty 10

## 2018-07-24 MED ORDER — BUPIVACAINE HCL 0.25 % IJ SOLN
INTRAMUSCULAR | Status: DC | PRN
  Administered 2018-07-24: 20 mL

## 2018-07-24 MED ORDER — METOCLOPRAMIDE HCL 5 MG PO TABS: 5.0000 mg | ORAL_TABLET | Freq: Three times a day (TID) | ORAL | Status: DC | PRN

## 2018-07-24 MED ORDER — FENTANYL CITRATE (PF) 100 MCG/2ML IJ SOLN
INTRAMUSCULAR | Status: DC | PRN
  Administered 2018-07-24 (×2): 100 ug via INTRAVENOUS
  Administered 2018-07-24: 50 ug via INTRAVENOUS

## 2018-07-24 MED ORDER — CEFAZOLIN SODIUM-DEXTROSE 1-4 GM/50ML-% IV SOLN
1.0000 g | Freq: Once | INTRAVENOUS | Status: AC
  Administered 2018-07-24: 1 g via INTRAVENOUS
  Filled 2018-07-24: qty 50

## 2018-07-24 MED ORDER — CHLORHEXIDINE GLUCONATE 4 % EX LIQD
60.0000 mL | Freq: Once | CUTANEOUS | Status: AC
  Administered 2018-07-24: 4 via TOPICAL

## 2018-07-24 MED ORDER — ENOXAPARIN SODIUM 40 MG/0.4ML ~~LOC~~ SOLN
40.0000 mg | SUBCUTANEOUS | Status: DC
  Administered 2018-07-25 – 2018-07-26 (×2): 40 mg via SUBCUTANEOUS
  Filled 2018-07-24 (×2): qty 0.4

## 2018-07-24 MED ORDER — MIDAZOLAM HCL 5 MG/5ML IJ SOLN
INTRAMUSCULAR | Status: DC | PRN
  Administered 2018-07-24: 2 mg via INTRAVENOUS

## 2018-07-24 MED ORDER — OXYCODONE HCL 5 MG PO TABS
10.0000 mg | ORAL_TABLET | ORAL | Status: DC | PRN
  Administered 2018-07-24 – 2018-07-27 (×5): 10 mg via ORAL
  Filled 2018-07-24 (×5): qty 2

## 2018-07-24 MED ORDER — BUPIVACAINE HCL (PF) 0.25 % IJ SOLN
INTRAMUSCULAR | Status: AC
  Filled 2018-07-24: qty 30

## 2018-07-24 MED ORDER — BISACODYL 10 MG RE SUPP: 10.0000 mg | Freq: Every day | RECTAL | Status: DC | PRN

## 2018-07-24 MED ORDER — PHENOL 1.4 % MT LIQD: 1.0000 | OROMUCOSAL | Status: DC | PRN

## 2018-07-24 MED ORDER — ALBUTEROL SULFATE HFA 108 (90 BASE) MCG/ACT IN AERS: 2.0000 | INHALATION_SPRAY | RESPIRATORY_TRACT | Status: DC | PRN

## 2018-07-24 MED ORDER — HYDROMORPHONE HCL 2 MG/ML IJ SOLN
2.0000 mg | Freq: Once | INTRAMUSCULAR | Status: AC
  Administered 2018-07-24: 2 mg via INTRAVENOUS

## 2018-07-24 MED ORDER — SUGAMMADEX SODIUM 200 MG/2ML IV SOLN
INTRAVENOUS | Status: AC
  Filled 2018-07-24: qty 2

## 2018-07-24 MED ORDER — LIDOCAINE 2% (20 MG/ML) 5 ML SYRINGE
INTRAMUSCULAR | Status: DC | PRN
  Administered 2018-07-24: 100 mg via INTRAVENOUS

## 2018-07-24 MED ORDER — PROMETHAZINE HCL 25 MG/ML IJ SOLN: 6.2500 mg | INTRAMUSCULAR | Status: DC | PRN

## 2018-07-24 MED ORDER — ALUM & MAG HYDROXIDE-SIMETH 200-200-20 MG/5ML PO SUSP
30.0000 mL | ORAL | Status: DC | PRN
  Administered 2018-07-25: 30 mL via ORAL
  Filled 2018-07-24: qty 30

## 2018-07-24 MED ORDER — HYDROCHLOROTHIAZIDE 25 MG PO TABS
12.5000 mg | ORAL_TABLET | Freq: Every day | ORAL | Status: DC
  Administered 2018-07-25 – 2018-07-30 (×6): 12.5 mg via ORAL
  Filled 2018-07-24 (×7): qty 1

## 2018-07-24 MED ORDER — ONDANSETRON HCL 4 MG PO TABS
4.0000 mg | ORAL_TABLET | Freq: Four times a day (QID) | ORAL | Status: DC | PRN
  Administered 2018-07-28: 4 mg via ORAL
  Filled 2018-07-24: qty 1

## 2018-07-24 MED ORDER — SODIUM CHLORIDE 0.9 % IV SOLN: INTRAVENOUS | Status: DC | PRN

## 2018-07-24 MED ORDER — TETANUS-DIPHTH-ACELL PERTUSSIS 5-2.5-18.5 LF-MCG/0.5 IM SUSP
0.5000 mL | Freq: Once | INTRAMUSCULAR | Status: AC
  Administered 2018-07-24: 0.5 mL via INTRAMUSCULAR
  Filled 2018-07-24: qty 0.5

## 2018-07-24 MED ORDER — LACTATED RINGERS IV SOLN
INTRAVENOUS | Status: DC | PRN
  Administered 2018-07-24 (×2): via INTRAVENOUS

## 2018-07-24 MED ORDER — HYDROMORPHONE HCL 1 MG/ML IJ SOLN
INTRAMUSCULAR | Status: DC | PRN
  Administered 2018-07-24: 1 mg via INTRAVENOUS

## 2018-07-24 MED ORDER — MIDAZOLAM HCL 2 MG/2ML IJ SOLN
INTRAMUSCULAR | Status: AC
  Filled 2018-07-24: qty 2

## 2018-07-24 MED ORDER — CEFAZOLIN SODIUM-DEXTROSE 2-4 GM/100ML-% IV SOLN
2.0000 g | Freq: Four times a day (QID) | INTRAVENOUS | Status: AC
  Administered 2018-07-24 (×2): 2 g via INTRAVENOUS
  Filled 2018-07-24 (×2): qty 100

## 2018-07-24 MED ORDER — SUGAMMADEX SODIUM 200 MG/2ML IV SOLN
INTRAVENOUS | Status: DC | PRN
  Administered 2018-07-24: 200 mg via INTRAVENOUS

## 2018-07-24 MED ORDER — MENTHOL 3 MG MT LOZG: 1.0000 | LOZENGE | OROMUCOSAL | Status: DC | PRN

## 2018-07-24 SURGICAL SUPPLY — 35 items
BIT DRILL 4.3MMS DISTAL GRDTED (BIT) ×1 IMPLANT
BNDG COHESIVE 6X5 TAN STRL LF (GAUZE/BANDAGES/DRESSINGS) ×2 IMPLANT
COVER PERINEAL POST (MISCELLANEOUS) ×2 IMPLANT
COVER SURGICAL LIGHT HANDLE (MISCELLANEOUS) ×2 IMPLANT
COVER WAND RF STERILE (DRAPES) IMPLANT
DRAPE C-ARM 42X120 X-RAY (DRAPES) ×2 IMPLANT
DRAPE C-ARMOR (DRAPES) ×2 IMPLANT
DRAPE STERI IOBAN 125X83 (DRAPES) ×2 IMPLANT
DRILL 4.3MMS DISTAL GRADUATED (BIT) ×2
DRSG PAD ABDOMINAL 8X10 ST (GAUZE/BANDAGES/DRESSINGS) ×2 IMPLANT
ELECT REM PT RETURN 15FT ADLT (MISCELLANEOUS) ×2 IMPLANT
GAUZE SPONGE 4X4 12PLY STRL (GAUZE/BANDAGES/DRESSINGS) ×2 IMPLANT
GAUZE XEROFORM 5X9 LF (GAUZE/BANDAGES/DRESSINGS) ×2 IMPLANT
GLOVE INDICATOR 8.0 STRL GRN (GLOVE) ×2 IMPLANT
GLOVE ORTHO TXT STRL SZ7.5 (GLOVE) ×2 IMPLANT
GOWN STRL REUS W/TWL XL LVL3 (GOWN DISPOSABLE) ×2 IMPLANT
GUIDEWIRE BALL NOSE 100CM (WIRE) ×2 IMPLANT
HFN RH 130 DEG 11MM X 420MM (Nail) ×2 IMPLANT
KIT BASIN OR (CUSTOM PROCEDURE TRAY) ×2 IMPLANT
MANIFOLD NEPTUNE II (INSTRUMENTS) ×2 IMPLANT
NEEDLE HYPO 22GX1.5 SAFETY (NEEDLE) ×2 IMPLANT
PACK GENERAL/GYN (CUSTOM PROCEDURE TRAY) ×2 IMPLANT
PAD CAST 4YDX4 CTTN HI CHSV (CAST SUPPLIES) ×1 IMPLANT
PADDING CAST COTTON 4X4 STRL (CAST SUPPLIES) ×1
PROTECTOR NERVE ULNAR (MISCELLANEOUS) ×4 IMPLANT
SCREW BONE CORTICAL 5.0X40 (Screw) ×2 IMPLANT
SCREW BONE CORTICAL 5.0X42 (Screw) ×2 IMPLANT
SCREW LAG 10.5MMX105MM HFN (Screw) ×2 IMPLANT
STAPLER VISISTAT 35W (STAPLE) ×2 IMPLANT
SUT VIC AB 1 CT1 36 (SUTURE) ×4 IMPLANT
SUT VIC AB 2-0 CT1 27 (SUTURE) ×1
SUT VIC AB 2-0 CT1 TAPERPNT 27 (SUTURE) ×1 IMPLANT
SYR 20CC LL (SYRINGE) ×2 IMPLANT
TAPE CLOTH SURG 6X10 WHT LF (GAUZE/BANDAGES/DRESSINGS) ×2 IMPLANT
TOWEL OR 17X26 10 PK STRL BLUE (TOWEL DISPOSABLE) ×4 IMPLANT
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
## 2018-07-24 NOTE — Anesthesia Preprocedure Evaluation (Addendum)
Anesthesia Evaluation  Patient identified by MRN, date of birth, ID band Patient awake    Reviewed: Allergy & Precautions, NPO status , Patient's Chart, lab work & pertinent test results  Airway Mallampati: I  TM Distance: >3 FB Neck ROM: Full    Dental no notable dental hx. (+) Teeth Intact, Dental Advisory Given   Pulmonary asthma , former smoker,  No recent Er Visits for asthma   Pulmonary exam normal breath sounds clear to auscultation       Cardiovascular Exercise Tolerance: Good negative cardio ROS Normal cardiovascular exam Rhythm:Regular Rate:Normal     Neuro/Psych Anxiety negative neurological ROS     GI/Hepatic negative GI ROS,   Endo/Other    Renal/GU      Musculoskeletal   Abdominal   Peds  Hematology negative hematology ROS (+)   Anesthesia Other Findings   Reproductive/Obstetrics                            Lab Results  Component Value Date   WBC 4.7 07/24/2018   HGB 16.3 07/24/2018   HCT 46.8 07/24/2018   MCV 87.0 07/24/2018   PLT 317 07/24/2018    Anesthesia Physical Anesthesia Plan  ASA: II and emergent  Anesthesia Plan: General   Post-op Pain Management:    Induction: Intravenous  PONV Risk Score and Plan: 2 and Treatment may vary due to age or medical condition, Ondansetron and Dexamethasone  Airway Management Planned: Oral ETT  Additional Equipment:   Intra-op Plan:   Post-operative Plan: Extubation in OR  Informed Consent: I have reviewed the patients History and Physical, chart, labs and discussed the procedure including the risks, benefits and alternatives for the proposed anesthesia with the patient or authorized representative who has indicated his/her understanding and acceptance.     Dental advisory given  Plan Discussed with: CRNA  Anesthesia Plan Comments:        Anesthesia Quick Evaluation
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
## 2018-07-24 NOTE — Anesthesia Procedure Notes (Signed)
Procedure Name: Intubation Date/Time: 07/24/2018 7:37 AM Performed by: Vanessa Greenwood, CRNA Pre-anesthesia Checklist: Patient identified, Emergency Drugs available, Suction available and Patient being monitored Patient Re-evaluated:Patient Re-evaluated prior to induction Oxygen Delivery Method: Circle system utilized Preoxygenation: Pre-oxygenation with 100% oxygen Induction Type: IV induction Ventilation: Mask ventilation without difficulty Laryngoscope Size: 2 and Miller Grade View: Grade I Tube type: Oral Tube size: 7.5 mm Number of attempts: 1 Airway Equipment and Method: Stylet Placement Confirmation: ETT inserted through vocal cords under direct vision,  positive ETCO2 and breath sounds checked- equal and bilateral Secured at: 22 cm Tube secured with: Tape Dental Injury: Teeth and Oropharynx as per pre-operative assessment
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
## 2018-07-24 NOTE — Transfer of Care (Signed)
Immediate Anesthesia Transfer of Care Note  Patient: Tracy [name]  Procedure(s) Performed: INTRAMEDULLARY (IM) NAIL INTERTROCHANTRIC (Right Hip)  Patient Location: PACU  Anesthesia Type:General  Level of Consciousness: drowsy  Airway & Oxygen Therapy: Patient Spontanous Breathing and Patient connected to face mask  Post-op Assessment: Report given to RN and Post -op Vital signs reviewed and stable  Post vital signs: Reviewed and stable  Last Vitals:  Vitals Value Taken Time  BP 122/57 07/24/2018  9:30 AM  Temp    Pulse 88 07/24/2018  9:33 AM  Resp 11 07/24/2018  9:32 AM  SpO2 92 % 07/24/2018  9:33 AM  Vitals shown include unvalidated device data.  Last Pain:  Vitals:   07/24/18 0500  TempSrc: Oral  PainSc:          Complications: No apparent anesthesia complications

## 2018-07-24 NOTE — ED Notes (Signed)
Date and time results received: 07/24/18 0346   Test: Lactic Acid Critical Value: 3.7  Name of Provider Notified:Lisa Allyne Gee EDPA  Orders Received? Or Actions Taken?: NS fluid bolus and then 125/hr
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
## 2018-07-24 NOTE — Progress Notes (Signed)
Respiratory gave pt breathing treatment for wheezing and shortness of breath. Pt 02 sat was stable at 95 percent on 2lnc. Pt also appeared very anxious overall. rn encouraged pt rest, relax, and take good deep breaths. Pt wound is bleeding on bed but pt will not let rn look at wound at this time. Pt reports having anxiety and asthma attacks at home. Pt also reports throat irritation and mucus production. Pt able to cough up secretions from throat.

## 2018-07-24 NOTE — ED Notes (Signed)
PT reporting no improvement in pain after Dilaudid.
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
## 2018-07-24 NOTE — Progress Notes (Signed)
Pt back from PACU in stable condition though breathing remains with small periods of apnea. VS Stable. Pt sleepy though arousable. Family updated on pt overall condition. rn also encouraged less visitors to allow pt to rest today. Condom cath in place wnl. Will continue to monitor.

## 2018-07-24 NOTE — Anesthesia Postprocedure Evaluation (Signed)
Anesthesia Post Note  Patient: [name]  Procedure(s) Performed: INTRAMEDULLARY (IM) NAIL INTERTROCHANTRIC (Right Hip)     Patient location during evaluation: PACU Anesthesia Type: General Level of consciousness: awake and alert Pain management: pain level controlled Vital Signs Assessment: post-procedure vital signs reviewed and stable Respiratory status: spontaneous breathing, nonlabored ventilation, respiratory function stable and patient connected to nasal cannula oxygen Cardiovascular status: blood pressure returned to baseline and stable Postop Assessment: no apparent nausea or vomiting Anesthetic complications: no    Last Vitals:  Vitals:   07/24/18 1045 07/24/18 1100  BP: 134/76 (!) 147/62  Pulse: 76 64  Resp: 14 15  Temp:    SpO2: 100% 96%    Last Pain:  Vitals:   07/24/18 1100  TempSrc:   PainSc: 0-No pain                 Trevor Iha

## 2018-07-24 NOTE — ED Notes (Signed)
Dr. Ophelia Charter at bedside at this time to assess pt.
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
## 2018-07-24 NOTE — Plan of Care (Signed)
Pt stable at this time. No changes in pt overall condition on intial return from PACU. Family remains at bedside. No changes required to care plans.

## 2018-07-25 ENCOUNTER — Other Ambulatory Visit: Payer: Self-pay

## 2018-07-25 LAB — COMPREHENSIVE METABOLIC PANEL
ALT: 71 U/L — ABNORMAL HIGH (ref 0–44)
AST: 62 U/L — ABNORMAL HIGH (ref 15–41)
Albumin: 3.3 g/dL — ABNORMAL LOW (ref 3.5–5.0)
Alkaline Phosphatase: 39 U/L (ref 38–126)
Anion gap: 7 (ref 5–15)
BUN: 15 mg/dL (ref 6–20)
CO2: 33 mmol/L — ABNORMAL HIGH (ref 22–32)
Calcium: 8.5 mg/dL — ABNORMAL LOW (ref 8.9–10.3)
Chloride: 93 mmol/L — ABNORMAL LOW (ref 98–111)
Creatinine, Ser: 0.9 mg/dL (ref 0.61–1.24)
GFR calc Af Amer: >60 mL/min (ref >60)
GFR calc non Af Amer: >60 mL/min (ref >60)
Glucose, Bld: 113 mg/dL — ABNORMAL HIGH (ref 70–99)
Potassium: 3.3 mmol/L — ABNORMAL LOW (ref 3.5–5.1)
Sodium: 133 mmol/L — ABNORMAL LOW (ref 135–145)
Total Bilirubin: 0.6 mg/dL (ref 0.3–1.2)
Total Protein: 5.9 g/dL — ABNORMAL LOW (ref 6.5–8.1)

## 2018-07-25 LAB — CBC
HCT: 34.2 % — ABNORMAL LOW (ref 39.0–52.0)
HCT: 30 % — ABNORMAL LOW (ref 39.0–52.0)
Hemoglobin: 11.3 g/dL — ABNORMAL LOW (ref 13.0–17.0)
Hemoglobin: 10.2 g/dL — ABNORMAL LOW (ref 13.0–17.0)
MCH: 29.8 pg (ref 26.0–34.0)
MCH: 30.4 pg (ref 26.0–34.0)
MCHC: 33 g/dL (ref 30.0–36.0)
MCHC: 34 g/dL (ref 30.0–36.0)
MCV: 90.2 fL (ref 80.0–100.0)
MCV: 89.3 fL (ref 80.0–100.0)
Platelets: 219 10*3/uL (ref 150–400)
Platelets: 197 10*3/uL (ref 150–400)
RBC: 3.79 MIL/uL — ABNORMAL LOW (ref 4.22–5.81)
RBC: 3.36 MIL/uL — ABNORMAL LOW (ref 4.22–5.81)
RDW: 12.3 % (ref 11.5–15.5)
RDW: 12.3 % (ref 11.5–15.5)
WBC: 8.1 10*3/uL (ref 4.0–10.5)
WBC: 8.4 10*3/uL (ref 4.0–10.5)
nRBC: 0 % (ref 0.0–0.2)
nRBC: 0 % (ref 0.0–0.2)

## 2018-07-25 LAB — BASIC METABOLIC PANEL
Anion gap: 8 (ref 5–15)
BUN: 12 mg/dL (ref 6–20)
CO2: 29 mmol/L (ref 22–32)
Calcium: 8.6 mg/dL — ABNORMAL LOW (ref 8.9–10.3)
Chloride: 100 mmol/L (ref 98–111)
Creatinine, Ser: 1.01 mg/dL (ref 0.61–1.24)
GFR calc Af Amer: >60 mL/min (ref >60)
GFR calc non Af Amer: >60 mL/min (ref >60)
Glucose, Bld: 130 mg/dL — ABNORMAL HIGH (ref 70–99)
Potassium: 3.9 mmol/L (ref 3.5–5.1)
Sodium: 137 mmol/L (ref 135–145)

## 2018-07-25 LAB — TROPONIN I: Troponin I: <0.03 ng/mL (ref <0.03)

## 2018-07-25 NOTE — Progress Notes (Signed)
Pt states chest pain has resolved. Pt c/o pain in surgical area. Medication given per MAR. VSS. Will continue to monitor.

## 2018-07-25 NOTE — Progress Notes (Signed)
   Subjective: 1 Day Post-Op Procedure(s) (LRB): INTRAMEDULLARY (IM) NAIL INTERTROCHANTRIC (Right) Patient reports pain as moderate.    Objective: Vital signs in last 24 hours: Temp:  [98.1 F (36.7 C)-99.8 F (37.7 C)] 98.7 F (37.1 C) (02/24 5208) Pulse Rate:  [61-97] 75 (02/24 0613) Resp:  [12-20] 15 (02/24 0613) BP: (122-160)/(57-96) 146/80 (02/24 0613) SpO2:  [93 %-100 %] 100 % (02/24 0223)  Intake/Output from previous day: 02/23 0701 - 02/24 0700 In: 5295 [P.O.:1320; I.V.:3774.9; IV Piggyback:200.1] Out: 4825 [Urine:4525; Blood:300] Intake/Output this shift: No intake/output data recorded.  Recent Labs    07/24/18 0235 07/25/18 0503  HGB 16.3 11.3*   Recent Labs    07/24/18 0235 07/25/18 0503  WBC 4.7 8.1  RBC 5.38 3.79*  HCT 46.8 34.2*  PLT 317 219   Recent Labs    07/24/18 0235 07/25/18 0503  NA 137 137  K 3.3* 3.9  CL 104 100  CO2 21* 29  BUN 12 12  CREATININE 0.99 1.01  GLUCOSE 164* 130*  CALCIUM 9.5 8.6*   Recent Labs    07/24/18 0235  INR 1.03    Neurologically intact, dressing dry. Mild thigh swelling Dg C-arm 1-60 Min-no Report  Result Date: 07/24/2018 Fluoroscopy was utilized by the requesting physician.  No radiographic interpretation.   Dg Femur, Min 2 Views Right  Result Date: 07/24/2018 CLINICAL DATA:  Right femoral rod placement. EXAM: RIGHT FEMUR 2 VIEWS; DG C-ARM 1-60 MIN-NO REPORT COMPARISON:  07/24/2018 FINDINGS: Intraoperative fluoroscopic images from intramedullary femoral rod fixation of comminuted proximal femoral fracture demonstrate normal alignment of the hardware. Metallic shrapnel is again seen. Fluoroscopy time is recorded as 1 minutes 31 seconds IMPRESSION: Intraoperative fluoroscopic images from intramedullary rod fixation of comminuted proximal right femoral fracture. Electronically Signed   By: Ted Mcalpine M.D.   On: 07/24/2018 14:34    Assessment/Plan: 1 Day Post-Op Procedure(s) (LRB): INTRAMEDULLARY  (IM) NAIL INTERTROCHANTRIC (Right) Up with therapy, dressing change.   Eldred Manges 07/25/2018, 7:49 AM
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
## 2018-07-25 NOTE — Progress Notes (Signed)
Rehab Admissions Coordinator Note:  Patient was screened by Trish Mage for appropriateness for an Inpatient Acute Rehab Consult.  At this time, patient prefers to discharge home.  I would like to see how patient does with next therapies prior to determining need for CIR versus HH therapies.  Call me for questions.  Lelon Frohlich M 07/25/2018, 1:21 PM  I can be reached at [phone].
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
## 2018-07-25 NOTE — Progress Notes (Signed)
Pt c/o chest pain. On call MD informed of EKG results, vital signs and pt symptoms. New orders received and entered. MD informed that pt is tachycardic and that is a change from pt baseline.   Pt stable at this time in no acute distress. VSS. Will continue to monitor

## 2018-07-26 DIAGNOSIS — D62 Acute posthemorrhagic anemia: Secondary | ICD-10-CM

## 2018-07-26 HISTORY — DX: Acute posthemorrhagic anemia: D62

## 2018-07-26 LAB — CBC
HCT: 28.1 % — ABNORMAL LOW (ref 39.0–52.0)
Hemoglobin: 9.7 g/dL — ABNORMAL LOW (ref 13.0–17.0)
MCH: 30.8 pg (ref 26.0–34.0)
MCHC: 34.5 g/dL (ref 30.0–36.0)
MCV: 89.2 fL (ref 80.0–100.0)
Platelets: 202 10*3/uL (ref 150–400)
RBC: 3.15 MIL/uL — ABNORMAL LOW (ref 4.22–5.81)
RDW: 12.1 % (ref 11.5–15.5)
WBC: 9.3 10*3/uL (ref 4.0–10.5)
nRBC: 0 % (ref 0.0–0.2)

## 2018-07-26 LAB — BASIC METABOLIC PANEL
Anion gap: 7 (ref 5–15)
BUN: 16 mg/dL (ref 6–20)
CO2: 33 mmol/L — ABNORMAL HIGH (ref 22–32)
Calcium: 8.5 mg/dL — ABNORMAL LOW (ref 8.9–10.3)
Chloride: 92 mmol/L — ABNORMAL LOW (ref 98–111)
Creatinine, Ser: 0.92 mg/dL (ref 0.61–1.24)
GFR calc Af Amer: >60 mL/min (ref >60)
GFR calc non Af Amer: >60 mL/min (ref >60)
Glucose, Bld: 118 mg/dL — ABNORMAL HIGH (ref 70–99)
Potassium: 3.4 mmol/L — ABNORMAL LOW (ref 3.5–5.1)
Sodium: 132 mmol/L — ABNORMAL LOW (ref 135–145)

## 2018-07-26 MED ORDER — DIPHENHYDRAMINE HCL 25 MG PO CAPS
25.0000 mg | ORAL_CAPSULE | Freq: Three times a day (TID) | ORAL | Status: DC | PRN
  Administered 2018-07-26 – 2018-07-27 (×3): 50 mg via ORAL
  Filled 2018-07-26 (×3): qty 2

## 2018-07-26 MED ORDER — IPRATROPIUM-ALBUTEROL 0.5-2.5 (3) MG/3ML IN SOLN
3.0000 mL | Freq: Two times a day (BID) | RESPIRATORY_TRACT | Status: DC
  Administered 2018-07-27 – 2018-07-30 (×6): 3 mL via RESPIRATORY_TRACT
  Filled 2018-07-26 (×10): qty 3

## 2018-07-26 MED ORDER — LIP MEDEX EX OINT
TOPICAL_OINTMENT | CUTANEOUS | Status: AC
  Administered 2018-07-26: 15:00:00
  Filled 2018-07-26: qty 7

## 2018-07-26 NOTE — Progress Notes (Signed)
   Vital Signs MEWS/VS Documentation      07/25/2018 2152 07/26/2018 0609 07/26/2018 0913 07/26/2018 0915   MEWS Score:  -  1  2  2    MEWS Score Color:  -  Green  Yellow  Yellow   Resp:  -  14  -  -   Pulse:  -  (!) 102  (!) 111  -   BP:  -  (!) 141/80  (!) 154/91  -   Temp:  -  99.1 F (37.3 C)  -  -   O2 Device:  Room Air  Room Air  -  -   O2 Flow Rate (L/min):  -  0 L/min  -  -   FiO2 (%):  98 %  -  -  -   Level of Consciousness:  -  -  -  Alert     Yates, MD was notified of the MEWS score and the pt's tachycardia. The MD advised to encourage the pt to get up and out of bed with pt. I will continue to monitor the pt.        Tilden Fossa 07/26/2018,10:42 AM

## 2018-07-26 NOTE — Progress Notes (Signed)
CSW consult SNF Patient prefers Home vs. CIR CSW will sign off at this time.   Vivi Barrack, [place], MSW Clinical Social Worker  [phone] 07/26/2018  8:21 AM.
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
## 2018-07-26 NOTE — Progress Notes (Signed)
Pt c/o severe itching. Paged on call MD for Benadryl. New orders received and entered. Will CTM.

## 2018-07-27 LAB — CBC
HCT: 27.7 % — ABNORMAL LOW (ref 39.0–52.0)
Hemoglobin: 9.3 g/dL — ABNORMAL LOW (ref 13.0–17.0)
MCH: 29.9 pg (ref 26.0–34.0)
MCHC: 33.6 g/dL (ref 30.0–36.0)
MCV: 89.1 fL (ref 80.0–100.0)
Platelets: 236 10*3/uL (ref 150–400)
RBC: 3.11 MIL/uL — ABNORMAL LOW (ref 4.22–5.81)
RDW: 11.9 % (ref 11.5–15.5)
WBC: 10.5 10*3/uL (ref 4.0–10.5)
nRBC: 0 % (ref 0.0–0.2)

## 2018-07-27 MED ORDER — METHOCARBAMOL 500 MG PO TABS
500.0000 mg | ORAL_TABLET | Freq: Three times a day (TID) | ORAL | Status: DC | PRN
  Administered 2018-07-28 – 2018-07-31 (×7): 500 mg via ORAL
  Filled 2018-07-27 (×7): qty 1

## 2018-07-27 MED ORDER — METHOCARBAMOL 1000 MG/10ML IJ SOLN: 500.0000 mg | Freq: Four times a day (QID) | INTRAVENOUS | Status: DC | PRN

## 2018-07-27 MED ORDER — OXYCODONE-ACETAMINOPHEN 5-325 MG PO TABS
1.0000 | ORAL_TABLET | Freq: Four times a day (QID) | ORAL | Status: DC | PRN
  Administered 2018-07-28 (×3): 1 via ORAL
  Administered 2018-07-28 – 2018-07-31 (×8): 2 via ORAL
  Filled 2018-07-27 (×9): qty 2
  Filled 2018-07-27 (×2): qty 1
  Filled 2018-07-27: qty 2

## 2018-07-27 NOTE — Plan of Care (Signed)
  Problem: Pain Managment: Goal: General experience of comfort will improve Outcome: Progressing   Problem: Coping: Goal: Level of anxiety will decrease Outcome: Progressing   Problem: Nutrition: Goal: Adequate nutrition will be maintained Outcome: Progressing   Problem: Activity: Goal: Risk for activity intolerance will decrease Outcome: Progressing   Problem: Clinical Measurements: Goal: Cardiovascular complication will be avoided Outcome: Progressing

## 2018-07-27 NOTE — Progress Notes (Signed)
PT Cancellation Note  Patient Details Name: Tracy [name] MRN: [id number] DOB: 11-12-1995   Cancelled Treatment:    Reason Eval/Treat Not Completed: Patient declined, wanted to wait earlier after meds, now wants to wash up. Will proceed as patient is participatory. no reason specified   Sharen Heck PT Acute Rehabilitation Services Pager [phone] Office [phone]  07/27/2018, 11:53 AM
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
## 2018-07-27 NOTE — Care Management Note (Signed)
Case Management Note  Patient Details  Name: JERAME DUERKSEN MRN: [id number] Date of Birth: Apr 04, 1996  Subjective/Objective:                  Discharge Planning  Action/Plan: Refused CIR-asked about [facility] states that he does not care-will [facility] in 1 and  [facility] through [facility] Expected Discharge Date:                  Expected Discharge Plan:  Home/Self Care  In-House Referral:  Clinical Social Work  Discharge planning Services  CM Consult, Other - See comment  Post Acute Care Choice:  Durable Medical Equipment Choice offered to:     DME Arranged:  3-N-1, Walker rolling DME Agency:  [facility]  HH Arranged:    HH Agency:     Status of Service:  In process, will continue to follow  If discussed at Long Length of Stay Meetings, dates discussed:    Additional Comments:  Golda Acre, RN 07/27/2018, 10:02 AM

## 2018-07-28 MED ORDER — FLEET ENEMA 7-19 GM/118ML RE ENEM: 1.0000 | ENEMA | Freq: Every day | RECTAL | Status: DC | PRN

## 2018-07-28 MED ORDER — BISACODYL 10 MG RE SUPP
10.0000 mg | Freq: Once | RECTAL | Status: AC
  Administered 2018-07-28: 10 mg via RECTAL
  Filled 2018-07-28: qty 1
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
## 2018-07-28 NOTE — Plan of Care (Signed)

## 2018-07-28 NOTE — Progress Notes (Signed)
   Subjective: 4 Days Post-Op Procedure(s) (LRB): INTRAMEDULLARY (IM) NAIL INTERTROCHANTRIC (Right) Patient reports pain as moderate.  Passing gas , no BM since admission , C/O abdominal pain and intermittant cramping.   Objective: Vital signs in last 24 hours: Temp:  [98.5 F (36.9 C)-98.9 F (37.2 C)] 98.7 F (37.1 C) (02/27 1429) Pulse Rate:  [95-104] 104 (02/27 1429) Resp:  [16-20] 17 (02/27 1429) BP: (146-165)/(77-87) 161/78 (02/27 1429) SpO2:  [85 %-100 %] 97 % (02/27 1800) FiO2 (%):  [98 %] 98 % (02/27 0849)  Intake/Output from previous day: 02/26 0701 - 02/27 0700 In: 1300 [P.O.:1300] Out: 1250 [Urine:1250] Intake/Output this shift: No intake/output data recorded.  Recent Labs    07/25/18 2204 07/26/18 0333 07/27/18 0537  HGB 10.2* 9.7* 9.3*   Recent Labs    07/26/18 0333 07/27/18 0537  WBC 9.3 10.5  RBC 3.15* 3.11*  HCT 28.1* 27.7*  PLT 202 236   Recent Labs    07/25/18 2204 07/26/18 0333  NA 133* 132*  K 3.3* 3.4*  CL 93* 92*  CO2 33* 33*  BUN 15 16  CREATININE 0.90 0.92  GLUCOSE 113* 118*  CALCIUM 8.5* 8.5*   No results for input(s): LABPT, INR in the last 72 hours. Physical Exam:   Thigh is softer can contract quad poorly.  No results found.  Assessment/Plan: 4 Days Post-Op Procedure(s) (LRB): INTRAMEDULLARY (IM) NAIL INTERTROCHANTRIC (Right) Up with therapy, 2 sisters at bedside. He will need to be more mobile to be discharged.  Dulcolax supp. , if no results then fleets.   Eldred Manges 07/28/2018, 8:08 PM

## 2018-07-29 LAB — HEMOGLOBIN AND HEMATOCRIT, BLOOD
HCT: 24.1 % — ABNORMAL LOW (ref 39.0–52.0)
Hemoglobin: 8.1 g/dL — ABNORMAL LOW (ref 13.0–17.0)

## 2018-07-29 MED ORDER — ASPIRIN 325 MG PO TABS: 325.0000 mg | ORAL_TABLET | Freq: Every day | ORAL | Status: DC

## 2018-07-29 MED ORDER — OXYCODONE-ACETAMINOPHEN 5-325 MG PO TABS: 1.0000 | ORAL_TABLET | Freq: Four times a day (QID) | ORAL | 0 refills | Status: DC | PRN

## 2018-07-29 MED ORDER — METHOCARBAMOL 500 MG PO TABS: 500.0000 mg | ORAL_TABLET | Freq: Three times a day (TID) | ORAL | 0 refills | Status: DC | PRN
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
## 2018-07-29 NOTE — Care Management Note (Signed)
Case Management Note  Patient Details  Name: Tracy [name] MRN: [id number] Date of Birth: [birth year]  Subjective/Objective:                  discharge  Action/Plan[facility]  [facility] Expected Discharge Date:  07/29/18               Expected Discharge Plan:  Home w Home Health Services  In-House Referral:  Clinical Social Work  Discharge planning Services  CM Consult, Other - See comment  Post Acute Care Choice:  Durable Medical Equipment Choice offered to:  NA  DME Arranged:  3-N-1, Walker rolling DME Agency:  [facility]  HH Arranged:  PT HH Agency:  [facility]  Status of Service:  Completed, signed off  If discussed at [employer] of Stay Meetings, dates discussed:    Additional Comments:  Golda Acre, RN 07/29/2018, 3:13 PM

## 2018-07-29 NOTE — Progress Notes (Signed)
After speaking with sister Morrie Sheldon, family is in the process of obtaining a bed for the patient. They state they will be ready tomorrow.

## 2018-07-29 NOTE — Progress Notes (Signed)
   Subjective: 5 Days Post-Op Procedure(s) (LRB): INTRAMEDULLARY (IM) NAIL INTERTROCHANTRIC (Right) Patient reports pain as mild and moderate.  Increased pain with mobility.   Objective: Vital signs in last 24 hours: Temp:  [98.7 F (37.1 C)-99.5 F (37.5 C)] 99.5 F (37.5 C) (02/27 2030) Pulse Rate:  [90-104] 90 (02/27 2030) Resp:  [16-17] 16 (02/27 2030) BP: (143-161)/(76-78) 143/76 (02/27 2030) SpO2:  [85 %-100 %] 100 % (02/28 1020)  Intake/Output from previous day: 02/27 0701 - 02/28 0700 In: 1020 [P.O.:1020] Out: 1750 [Urine:1750] Intake/Output this shift: Total I/O In: 120 [P.O.:120] Out: -   Recent Labs    07/27/18 0537 07/29/18 0444  HGB 9.3* 8.1*   Recent Labs    07/27/18 0537 07/29/18 0444  WBC 10.5  --   RBC 3.11*  --   HCT 27.7* 24.1*  PLT 236  --    No results for input(s): NA, K, CL, CO2, BUN, CREATININE, GLUCOSE, CALCIUM in the last 72 hours. No results for input(s): LABPT, INR in the last 72 hours.  Neurologically intact. Thigh swelling less.  No results found.  Assessment/Plan: 5 Days Post-Op Procedure(s) (LRB): INTRAMEDULLARY (IM) NAIL INTERTROCHANTRIC (Right) Up with therapy,  Needs family to get bed for him . Will fill out Rx for discharge and home possibly late afternoon or over the weekend.  Therapy to work with him on OOB to bedside commode.   Eldred Manges 07/29/2018, 12:12 PM
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
## 2018-07-29 NOTE — Discharge Instructions (Signed)
Use your walker when you get out of bed.  Work on tightening your quadriceps muscle doing the squeeze the tomato exercise that we discussed.  Call [phone] to set up appointment to see Dr. Ophelia Charter in 2 weeks.  It may be more convenient for you to go to the [place] [facility] on a Thursday which is located behind the [employer] next to [place]  [employer]zen [facility] and [facility].  Use ice off-and-on to help with your pain.  Your prescriptions were sent into the [facility] in [place] for you to pick up.

## 2018-07-30 DIAGNOSIS — F4310 Post-traumatic stress disorder, unspecified: Secondary | ICD-10-CM

## 2018-07-30 HISTORY — DX: Post-traumatic stress disorder, unspecified: F43.10

## 2018-07-30 MED ORDER — FERROUS SULFATE 325 (65 FE) MG PO TABS
325.0000 mg | ORAL_TABLET | Freq: Two times a day (BID) | ORAL | Status: DC
  Administered 2018-07-30: 325 mg via ORAL
  Filled 2018-07-30 (×3): qty 1

## 2018-07-30 MED ORDER — MIRTAZAPINE 15 MG PO TABS
7.5000 mg | ORAL_TABLET | Freq: Every day | ORAL | Status: DC
  Filled 2018-07-30: qty 1

## 2018-07-30 NOTE — Progress Notes (Signed)
PT Cancellation Note  Patient Details Name: Tracy [name] MRN: [id number] DOB: 07/15/1995   Cancelled Treatment:    Reason Eval/Treat Not Completed: Fatigue/lethargy limiting ability to participate, patient in bed, minimally responsive to therapists. Encouraged patient to work on  Sitting at bedside. HR noted in 130's while in bed. RN notified. Continue PT as patient will participate.   Rada Hay 07/30/2018, 3:06 PM Blanchard Kelch PT Acute Rehabilitation Services Pager [phone] Office [phone]

## 2018-07-30 NOTE — Progress Notes (Signed)
OT Cancellation Note  Patient Details Name: Tracy [name] MRN: [id number] DOB: Jul 10, 1995   Cancelled Treatment:    Reason Eval/Treat Not Completed: Patient declined, no reason specified Met with pt lying in bed, refusing OT this date. PT in and out of sleep stating he does not want to participate, ultimately stops answering. Noted HR up to 130 at rest. Will continue to follow as available and appropriate to continue OT POC.   Zenovia Jarred, MSOT, OTR/L Behavioral Health OT/ Acute Relief OT [facility] Office: [phone]  Zenovia Jarred 07/30/2018, 3:14 PM
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
## 2018-07-31 MED ORDER — MIRTAZAPINE 7.5 MG PO TABS: 7.5000 mg | ORAL_TABLET | Freq: Every day | ORAL | Status: DC
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
## 2018-07-31 NOTE — Progress Notes (Signed)
RN reviewed discharge paperwork with patient and patient's sister, Corrie Dandy. Patient was going home with Corrie Dandy to her house.   Paperwork sent with patient and family, and prescriptions had been called into patients pharmacy.   NT and Rn assisted patient into family vehicle. Patient was discharged with DME including crutches, 3in1, and walker.

## 2018-07-31 NOTE — Progress Notes (Signed)
Called to patient's room where he stated that he felt like he was going to pass out.   He had been asleep and stated that when he woke up he was confused and that the back of his neck hurt.   He also stated that he could not catch his breath.   We checked his VS and oxygen saturation was 100%, breath sounds WNL, and he was able to turn his head from side to side.  He refused any pain medication or his anxiety medication which he had refused at 10 pm.  We put warm blanked rolled up against his neck.  He called his sister asking her to come to the hospital.   She declined to come.  Another nurse also went in to try and talk with him while I was tied up in another room.  Her assessment was the same as mine--that he most likely woke up from a bad dream.  Will continue to monitor.  We told him we would talk with the physician in the morning.

## 2018-07-31 NOTE — Progress Notes (Signed)
Patient cussing at staff this morning saying "you come up in here telling me what I need to do, im a grown ass man, I know what I need to do, nobody needs to tell me what to f**in' do, yall need to leave me alone"  RN proceeded to try and talk with patient about pain management, discharge plan for today, as well as importance of taking blood pressure medication and blood thinner.   PA rounding for [place] then presented to the patient's room, and began a conversation with a patient about discharge.   Will continue to round and monitor patient.
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
## 2018-07-31 NOTE — Progress Notes (Signed)
Subjective: 7 Days Post-Op Procedure(s) (LRB): INTRAMEDULLARY (IM) NAIL INTERTROCHANTRIC (Right) Patient reports pain as moderate and severe.  Reports now has bed at home and will be okay for discharge later today.  Not cooperating with staff at times and refusing medications.  Objective: Vital signs in last 24 hours: Temp:  [98.5 F (36.9 C)-99.3 F (37.4 C)] 99.2 F (37.3 C) (03/01 0506) Pulse Rate:  [98-113] 103 (03/01 0506) Resp:  [16] 16 (03/01 0506) BP: (140-159)/(72-92) 140/79 (03/01 0506) SpO2:  [99 %-100 %] 100 % (03/01 0506) FiO2 (%):  [98 %] 98 % (02/29 0951)  Intake/Output from previous day: 02/29 0701 - 03/01 0700 In: 840 [P.O.:840] Out: 875 [Urine:875] Intake/Output this shift: No intake/output data recorded.  Recent Labs    07/29/18 0444  HGB 8.1*   Recent Labs    07/29/18 0444  HCT 24.1*   No results for input(s): NA, K, CL, CO2, BUN, CREATININE, GLUCOSE, CALCIUM in the last 72 hours. No results for input(s): LABPT, INR in the last 72 hours.  Right thigh soft, dressings with scant drainage. Neurovascular intact distally.    Assessment/Plan: 7 Days Post-Op Procedure(s) (LRB): INTRAMEDULLARY (IM) NAIL INTERTROCHANTRIC (Right) DC to home today.  Follow up in the office in 1 week.   Lazaro Arms, PA-C 07/31/2018, 9:19 AM  [facility] [phone]
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
## 2018-08-03 ENCOUNTER — Telehealth (INDEPENDENT_AMBULATORY_CARE_PROVIDER_SITE_OTHER): Payer: Self-pay | Admitting: Orthopaedic Surgery

## 2018-08-03 NOTE — Telephone Encounter (Signed)
Ok  Progressive . WBAT, knee and hip ROM

## 2018-08-03 NOTE — Telephone Encounter (Signed)
Tracy [name]/Tracy [name]/PT needs verbal orders  1 w 1 2 w 3  Any other precautions other than weight bearing  Call Tracy [name] [phone]

## 2018-08-03 NOTE — Telephone Encounter (Signed)
I called Joey and advised. 

## 2018-08-03 NOTE — Telephone Encounter (Signed)
Please advise. OK for orders?  Any precautions?

## 2018-08-10 ENCOUNTER — Encounter (HOSPITAL_COMMUNITY): Payer: Self-pay | Admitting: Orthopaedic Surgery

## 2018-08-11 ENCOUNTER — Telehealth (INDEPENDENT_AMBULATORY_CARE_PROVIDER_SITE_OTHER): Payer: Self-pay | Admitting: Orthopaedic Surgery

## 2018-08-11 NOTE — Telephone Encounter (Signed)
Patient called advised he had surgery 07/24/2018. Patient is due for post op visit with Dr. Ophelia Charter. 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 The number to contact patient is [phone]

## 2018-08-11 NOTE — Telephone Encounter (Signed)
Can you please call patient and work in to the 3:00pm spot tomorrow afternoon. He is post op. Thanks.

## 2018-08-12 ENCOUNTER — Encounter (INDEPENDENT_AMBULATORY_CARE_PROVIDER_SITE_OTHER): Payer: Self-pay | Admitting: Orthopaedic Surgery

## 2018-08-12 ENCOUNTER — Ambulatory Visit (INDEPENDENT_AMBULATORY_CARE_PROVIDER_SITE_OTHER): Payer: Self-pay

## 2018-08-12 ENCOUNTER — Ambulatory Visit (INDEPENDENT_AMBULATORY_CARE_PROVIDER_SITE_OTHER): Payer: Self-pay | Admitting: Orthopaedic Surgery

## 2018-08-12 ENCOUNTER — Other Ambulatory Visit: Payer: Self-pay

## 2018-08-12 VITALS — Ht 72.0 in | Wt 185.0 lb

## 2018-08-12 DIAGNOSIS — S72351B Displaced comminuted fracture of shaft of right femur, initial encounter for open fracture type I or II: Secondary | ICD-10-CM

## 2018-08-12 DIAGNOSIS — M79604 Pain in right leg: Secondary | ICD-10-CM

## 2018-08-12 MED ORDER — TRAMADOL HCL 50 MG PO TABS: 50.0000 mg | ORAL_TABLET | Freq: Four times a day (QID) | ORAL | 0 refills | Status: DC | PRN

## 2018-08-12 MED ORDER — METHOCARBAMOL 500 MG PO TABS: 500.0000 mg | ORAL_TABLET | Freq: Four times a day (QID) | ORAL | 0 refills | Status: DC
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
## 2018-08-17 ENCOUNTER — Telehealth (INDEPENDENT_AMBULATORY_CARE_PROVIDER_SITE_OTHER): Payer: Self-pay | Admitting: Orthopaedic Surgery

## 2018-08-17 NOTE — Telephone Encounter (Signed)
Fyi.  Please advise if anything further is needed.

## 2018-08-17 NOTE — Telephone Encounter (Signed)
Zoila Shutter with [facility] called advised patient is refusing services. Patient refused visit yesterday stating he did not want to get out of bed to answer the door. Drinda Butts said today patient said I got this and you can leave. Annette advised Joey (PT) is going out once this week to see if he can see patient and if patient refuse he will be discharged. The number to contact Drinda Butts is [phone]

## 2018-08-18 NOTE — Telephone Encounter (Signed)
I called his older sister and discussed.  Pt did not answer.

## 2018-08-18 NOTE — Telephone Encounter (Signed)
Noted. Pt no show for office visit this week. Noncompliant.

## 2018-08-24 ENCOUNTER — Telehealth (INDEPENDENT_AMBULATORY_CARE_PROVIDER_SITE_OTHER): Payer: Self-pay | Admitting: Radiology

## 2018-08-24 ENCOUNTER — Telehealth (INDEPENDENT_AMBULATORY_CARE_PROVIDER_SITE_OTHER): Payer: Self-pay | Admitting: Orthopaedic Surgery

## 2018-08-24 NOTE — Telephone Encounter (Signed)
Left message to callback and reschedule 09/13/2018 appointment with Dr. Ophelia Charter to before 2pm

## 2018-08-24 NOTE — Telephone Encounter (Signed)
error 

## 2018-08-29 ENCOUNTER — Telehealth (INDEPENDENT_AMBULATORY_CARE_PROVIDER_SITE_OTHER): Payer: Self-pay | Admitting: Orthopaedic Surgery

## 2018-08-29 NOTE — Telephone Encounter (Signed)
Michelle-(PT) from [facility] called left voicemail message stating patient completed current orders for Home Health Therapy-need verbal orders to extend orders for 2 Wk 2 and 1 Wk 2 to get motor control in right knee    The number to contact Marcelino Duster is [phone]

## 2018-08-29 NOTE — Telephone Encounter (Signed)
Ok for orders? 

## 2018-08-30 NOTE — Telephone Encounter (Signed)
Ok thx.

## 2018-08-30 NOTE — Telephone Encounter (Signed)
I called Tracy [name] and advised. 

## 2018-09-12 ENCOUNTER — Telehealth (INDEPENDENT_AMBULATORY_CARE_PROVIDER_SITE_OTHER): Payer: Self-pay | Admitting: Radiology

## 2018-09-12 NOTE — Telephone Encounter (Signed)
I called patient to reschedule his appointment for 09/13/2018 afternoon. Patient states that he has questions and that he even thought about going to the ED due to some of the pain that he is having. I did reschedule appointment, but patient requests a call from Dr. Ophelia Charter to discuss his pain.  Could you please call patient today to discuss?  CB is [phone].

## 2018-09-13 ENCOUNTER — Ambulatory Visit (INDEPENDENT_AMBULATORY_CARE_PROVIDER_SITE_OTHER): Payer: Self-pay | Admitting: Orthopaedic Surgery

## 2018-09-13 NOTE — Telephone Encounter (Signed)
FYI I called and discussed 12 noon he just woke up as I called.  He needs to work on bending his knee straightening his knee and quad strengthening in order to get rid of the discomfort in his hip.  We have discussed this with him multiple times and he will continue to work on it.  FYI

## 2018-09-14 ENCOUNTER — Ambulatory Visit (INDEPENDENT_AMBULATORY_CARE_PROVIDER_SITE_OTHER): Payer: Medicaid Other | Admitting: Orthopaedic Surgery

## 2018-09-14 NOTE — Telephone Encounter (Signed)
noted 

## 2018-09-27 ENCOUNTER — Emergency Department (HOSPITAL_COMMUNITY): Payer: Self-pay

## 2018-09-27 ENCOUNTER — Encounter (HOSPITAL_COMMUNITY): Payer: Self-pay

## 2018-09-27 ENCOUNTER — Other Ambulatory Visit: Payer: Self-pay

## 2018-09-27 ENCOUNTER — Emergency Department (HOSPITAL_COMMUNITY)
Admission: EM | Admit: 2018-09-27 | Discharge: 2018-09-27 | Disposition: A | Discharge status: Home / Self Care | Payer: Self-pay | Attending: Emergency Medicine | Admitting: Emergency Medicine

## 2018-09-27 DIAGNOSIS — Z87891 Personal history of nicotine dependence: Secondary | ICD-10-CM | POA: Insufficient documentation

## 2018-09-27 DIAGNOSIS — J069 Acute upper respiratory infection, unspecified: Secondary | ICD-10-CM | POA: Insufficient documentation

## 2018-09-27 DIAGNOSIS — Z7982 Long term (current) use of aspirin: Secondary | ICD-10-CM | POA: Insufficient documentation

## 2018-09-27 DIAGNOSIS — J45909 Unspecified asthma, uncomplicated: Secondary | ICD-10-CM | POA: Insufficient documentation

## 2018-09-27 DIAGNOSIS — B9789 Other viral agents as the cause of diseases classified elsewhere: Secondary | ICD-10-CM

## 2018-09-27 DIAGNOSIS — Z79899 Other long term (current) drug therapy: Secondary | ICD-10-CM | POA: Insufficient documentation

## 2018-09-27 MED ORDER — PREDNISONE 20 MG PO TABS
40.0000 mg | ORAL_TABLET | Freq: Once | ORAL | Status: AC
  Administered 2018-09-27: 40 mg via ORAL
  Filled 2018-09-27: qty 2

## 2018-09-27 MED ORDER — DEXAMETHASONE 4 MG PO TABS: 4.0000 mg | ORAL_TABLET | Freq: Two times a day (BID) | ORAL | 0 refills | Status: DC

## 2018-09-27 MED ORDER — ALBUTEROL SULFATE HFA 108 (90 BASE) MCG/ACT IN AERS
4.0000 | INHALATION_SPRAY | Freq: Once | RESPIRATORY_TRACT | Status: AC
  Administered 2018-09-27: 12:00:00 4 via RESPIRATORY_TRACT
  Filled 2018-09-27: qty 6.7
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
## 2018-09-27 NOTE — Discharge Instructions (Addendum)
Your chest x-ray is negative for pneumonia, collapsed lung, mass, or other major problems.  Your oxygen level is 100% on room air.  Your examination shows some slightly abnormal lung sounds that would be consistent with bronchitis and upper respiratory infection.  Please use 2 puffs of albuterol every 4 hours.  Please use Decadron 2 times daily with food until all taken.  Please monitor for changes in your temperature, or shortness of breath.  If either these occur, please see your primary physician or return to the emergency department immediately.  Please wash your hands frequently.  Please practice social distancing, keeping yourself 6 feet from others.  Please wipe off surfaces such as doorknobs and countertops as needed.  Use your mask until symptoms have resolved.

## 2018-09-27 NOTE — ED Notes (Signed)
PA at bedside.

## 2018-09-27 NOTE — ED Triage Notes (Signed)
Pt reports that he is out of his normal inhaler and has had difficulty breathing for the past 2 days. Also reports cough this am. Productive clear mucous. Denies fever and chills

## 2018-10-07 ENCOUNTER — Ambulatory Visit: Payer: Medicaid Other | Admitting: Orthopaedic Surgery

## 2018-10-21 ENCOUNTER — Ambulatory Visit: Payer: Medicaid Other | Admitting: Orthopaedic Surgery

## 2018-11-15 ENCOUNTER — Emergency Department (HOSPITAL_COMMUNITY)
Admission: EM | Admit: 2018-11-15 | Discharge: 2018-11-15 | Disposition: A | Discharge status: Home / Self Care | Payer: Medicaid Other | Attending: Emergency Medicine | Admitting: Emergency Medicine

## 2018-11-15 ENCOUNTER — Encounter (HOSPITAL_COMMUNITY): Payer: Self-pay | Admitting: *Deleted

## 2018-11-15 ENCOUNTER — Other Ambulatory Visit: Payer: Self-pay

## 2018-11-15 DIAGNOSIS — J45909 Unspecified asthma, uncomplicated: Secondary | ICD-10-CM | POA: Insufficient documentation

## 2018-11-15 DIAGNOSIS — Z87891 Personal history of nicotine dependence: Secondary | ICD-10-CM | POA: Diagnosis not present

## 2018-11-15 DIAGNOSIS — Z202 Contact with and (suspected) exposure to infections with a predominantly sexual mode of transmission: Secondary | ICD-10-CM | POA: Insufficient documentation

## 2018-11-15 DIAGNOSIS — N342 Other urethritis: Secondary | ICD-10-CM

## 2018-11-15 DIAGNOSIS — R300 Dysuria: Secondary | ICD-10-CM | POA: Diagnosis present

## 2018-11-15 DIAGNOSIS — N341 Nonspecific urethritis: Secondary | ICD-10-CM | POA: Insufficient documentation

## 2018-11-15 LAB — URINALYSIS, ROUTINE W REFLEX MICROSCOPIC
Bilirubin Urine: NEGATIVE
Glucose, UA: NEGATIVE mg/dL
Ketones, ur: NEGATIVE mg/dL
Nitrite: NEGATIVE
Protein, ur: NEGATIVE mg/dL
Specific Gravity, Urine: 1.014 (ref 1.005–1.030)
WBC, UA: >50 WBC/hpf — ABNORMAL HIGH (ref 0–5)
pH: 5 (ref 5.0–8.0)

## 2018-11-15 MED ORDER — CEFTRIAXONE SODIUM 250 MG IJ SOLR
250.0000 mg | Freq: Once | INTRAMUSCULAR | Status: AC
  Administered 2018-11-15: 250 mg via INTRAMUSCULAR
  Filled 2018-11-15: qty 250

## 2018-11-15 MED ORDER — LIDOCAINE HCL (PF) 1 % IJ SOLN
INTRAMUSCULAR | Status: AC
  Administered 2018-11-15: 1 mL
  Filled 2018-11-15: qty 2

## 2018-11-15 MED ORDER — AZITHROMYCIN 250 MG PO TABS
1000.0000 mg | ORAL_TABLET | Freq: Once | ORAL | Status: AC
  Administered 2018-11-15: 1000 mg via ORAL
  Filled 2018-11-15: qty 4

## 2018-11-15 NOTE — Discharge Instructions (Addendum)
Please read the instructions below.  Talk with your primary care provider about any new medications.  You have been treated today for gonorrhea and chlamydia. You will receive a call from the hospital if your test results come back positive. Avoid sexual activity until you know your test results. If your results come back positive, it is important that you inform all of your sexual partners.  It is important that you establish primary care. Return to the ER for new or worsening symptoms.

## 2018-11-15 NOTE — ED Triage Notes (Signed)
Pt c/o painful urination and clear penile discharge that started yesterday.
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
## 2018-11-17 LAB — URINE CULTURE: Culture: NO GROWTH

## 2018-11-17 LAB — GC/CHLAMYDIA PROBE AMP (~~LOC~~) NOT AT ARMC
Chlamydia: POSITIVE — AB
Neisseria Gonorrhea: POSITIVE — AB

## 2018-11-23 ENCOUNTER — Ambulatory Visit (INDEPENDENT_AMBULATORY_CARE_PROVIDER_SITE_OTHER): Payer: PRIVATE HEALTH INSURANCE

## 2018-11-23 ENCOUNTER — Other Ambulatory Visit: Payer: Self-pay

## 2018-11-23 ENCOUNTER — Encounter: Payer: Self-pay | Admitting: Orthopaedic Surgery

## 2018-11-23 ENCOUNTER — Ambulatory Visit (INDEPENDENT_AMBULATORY_CARE_PROVIDER_SITE_OTHER): Payer: PRIVATE HEALTH INSURANCE | Admitting: Orthopaedic Surgery

## 2018-11-23 VITALS — Ht 72.0 in | Wt 200.0 lb

## 2018-11-23 DIAGNOSIS — M79604 Pain in right leg: Secondary | ICD-10-CM
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
## 2019-02-22 ENCOUNTER — Ambulatory Visit: Payer: PRIVATE HEALTH INSURANCE | Admitting: Orthopaedic Surgery

## 2019-03-02 ENCOUNTER — Encounter (HOSPITAL_COMMUNITY): Payer: Self-pay | Admitting: Emergency Medicine

## 2019-03-02 ENCOUNTER — Emergency Department (HOSPITAL_COMMUNITY)
Admission: EM | Admit: 2019-03-02 | Discharge: 2019-03-02 | Disposition: A | Discharge status: Home / Self Care | Payer: PRIVATE HEALTH INSURANCE | Attending: Emergency Medicine | Admitting: Emergency Medicine

## 2019-03-02 ENCOUNTER — Other Ambulatory Visit: Payer: Self-pay

## 2019-03-02 DIAGNOSIS — Z87891 Personal history of nicotine dependence: Secondary | ICD-10-CM | POA: Insufficient documentation

## 2019-03-02 DIAGNOSIS — Z760 Encounter for issue of repeat prescription: Secondary | ICD-10-CM | POA: Insufficient documentation

## 2019-03-02 DIAGNOSIS — J45909 Unspecified asthma, uncomplicated: Secondary | ICD-10-CM | POA: Diagnosis not present

## 2019-03-02 DIAGNOSIS — Z79899 Other long term (current) drug therapy: Secondary | ICD-10-CM | POA: Diagnosis not present

## 2019-03-02 DIAGNOSIS — H1032 Unspecified acute conjunctivitis, left eye: Secondary | ICD-10-CM | POA: Diagnosis not present

## 2019-03-02 DIAGNOSIS — H5789 Other specified disorders of eye and adnexa: Secondary | ICD-10-CM | POA: Diagnosis present

## 2019-03-02 MED ORDER — TETRACAINE HCL 0.5 % OP SOLN
2.0000 [drp] | Freq: Once | OPHTHALMIC | Status: AC
  Administered 2019-03-02: 2 [drp] via OPHTHALMIC
  Filled 2019-03-02: qty 4

## 2019-03-02 MED ORDER — FLUORESCEIN SODIUM 1 MG OP STRP
1.0000 | ORAL_STRIP | Freq: Once | OPHTHALMIC | Status: AC
  Administered 2019-03-02: 1 via OPHTHALMIC
  Filled 2019-03-02: qty 1

## 2019-03-02 MED ORDER — ALBUTEROL SULFATE HFA 108 (90 BASE) MCG/ACT IN AERS
2.0000 | INHALATION_SPRAY | Freq: Once | RESPIRATORY_TRACT | Status: AC
  Administered 2019-03-02: 2 via RESPIRATORY_TRACT
  Filled 2019-03-02: qty 6.7

## 2019-03-02 MED ORDER — TOBRAMYCIN 0.3 % OP SOLN
1.0000 [drp] | OPHTHALMIC | Status: DC
  Administered 2019-03-02: 18:00:00 1 [drp] via OPHTHALMIC
  Filled 2019-03-02: qty 5
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
## 2019-03-02 NOTE — ED Triage Notes (Signed)
Pt states waking up three days ago with his left eye itching, kept rubbing it and now eye present with redness, swelling, draining, and pain.

## 2019-03-02 NOTE — ED Notes (Signed)
20/25 right eye 20/25 left eye

## 2019-03-02 NOTE — Discharge Instructions (Addendum)
Apply warm wet compresses on and off to your left eye 2-3 times a day.  Avoid rubbing or scratching at your eye.  Apply 1 drop of the tobramycin to your left eye every 4 hours for 7 days.  You may also need to take over-the-counter allergy medication such as Claritin or Zyrtec daily as directed for allergies.  Follow-up with your primary doctor or the eye doctor listed

## 2019-04-13 ENCOUNTER — Emergency Department (HOSPITAL_COMMUNITY): Payer: Self-pay

## 2019-04-13 ENCOUNTER — Other Ambulatory Visit: Payer: Self-pay

## 2019-04-13 ENCOUNTER — Encounter (HOSPITAL_COMMUNITY): Payer: Self-pay | Admitting: Emergency Medicine

## 2019-04-13 ENCOUNTER — Emergency Department (HOSPITAL_COMMUNITY)
Admission: EM | Admit: 2019-04-13 | Discharge: 2019-04-13 | Disposition: A | Discharge status: Home / Self Care | Payer: Self-pay | Attending: Emergency Medicine | Admitting: Emergency Medicine

## 2019-04-13 DIAGNOSIS — Z87891 Personal history of nicotine dependence: Secondary | ICD-10-CM | POA: Insufficient documentation

## 2019-04-13 DIAGNOSIS — L989 Disorder of the skin and subcutaneous tissue, unspecified: Secondary | ICD-10-CM | POA: Insufficient documentation

## 2019-04-13 DIAGNOSIS — J4520 Mild intermittent asthma, uncomplicated: Secondary | ICD-10-CM | POA: Insufficient documentation

## 2019-04-13 DIAGNOSIS — Z20828 Contact with and (suspected) exposure to other viral communicable diseases: Secondary | ICD-10-CM | POA: Insufficient documentation

## 2019-04-13 DIAGNOSIS — Z79899 Other long term (current) drug therapy: Secondary | ICD-10-CM | POA: Insufficient documentation

## 2019-04-13 LAB — SARS CORONAVIRUS 2 (TAT 6-24 HRS): SARS Coronavirus 2: NEGATIVE

## 2019-04-13 MED ORDER — ALBUTEROL SULFATE HFA 108 (90 BASE) MCG/ACT IN AERS
6.0000 | INHALATION_SPRAY | Freq: Once | RESPIRATORY_TRACT | Status: AC
  Administered 2019-04-13: 16:00:00 6 via RESPIRATORY_TRACT
  Filled 2019-04-13: qty 6.7

## 2019-04-13 MED ORDER — GRISEOFULVIN MICROSIZE 500 MG PO TABS: 500.0000 mg | ORAL_TABLET | Freq: Every day | ORAL | 0 refills | Status: DC

## 2019-04-13 MED ORDER — PREDNISONE 50 MG PO TABS
60.0000 mg | ORAL_TABLET | Freq: Once | ORAL | Status: AC
  Administered 2019-04-13: 60 mg via ORAL
  Filled 2019-04-13: qty 1

## 2019-04-13 MED ORDER — PREDNISONE 20 MG PO TABS: 40.0000 mg | ORAL_TABLET | Freq: Every day | ORAL | 0 refills | Status: DC

## 2019-04-13 NOTE — Discharge Instructions (Addendum)
Continue using your inhaler 1-2 puffs every 4-6 hrs.  Start the prednisone tomorrow.  Contact the provider listed to arrange a follow-up appt and establish care.

## 2019-04-13 NOTE — ED Triage Notes (Signed)
Pt c/o of wheezing and cough x 3 days. States " ive been using my rescue inhaler a lot"  Also c/o of a "bump" on his head with swelling.    Area red, draining and swollen
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
## 2019-05-12 ENCOUNTER — Emergency Department (HOSPITAL_COMMUNITY)
Admission: EM | Admit: 2019-05-12 | Discharge: 2019-05-12 | Disposition: A | Discharge status: Home / Self Care | Payer: Medicaid Other | Attending: Emergency Medicine | Admitting: Emergency Medicine

## 2019-05-12 ENCOUNTER — Encounter (HOSPITAL_COMMUNITY): Payer: Self-pay | Admitting: *Deleted

## 2019-05-12 ENCOUNTER — Other Ambulatory Visit: Payer: Self-pay

## 2019-05-12 DIAGNOSIS — R059 Cough, unspecified: Secondary | ICD-10-CM

## 2019-05-12 DIAGNOSIS — R05 Cough: Secondary | ICD-10-CM

## 2019-05-12 DIAGNOSIS — Z79899 Other long term (current) drug therapy: Secondary | ICD-10-CM | POA: Insufficient documentation

## 2019-05-12 DIAGNOSIS — R062 Wheezing: Secondary | ICD-10-CM | POA: Insufficient documentation

## 2019-05-12 MED ORDER — IPRATROPIUM BROMIDE HFA 17 MCG/ACT IN AERS: 2.0000 | INHALATION_SPRAY | Freq: Once | RESPIRATORY_TRACT | Status: DC

## 2019-05-12 MED ORDER — ALBUTEROL SULFATE HFA 108 (90 BASE) MCG/ACT IN AERS: 2.0000 | INHALATION_SPRAY | Freq: Four times a day (QID) | RESPIRATORY_TRACT | Status: DC

## 2019-05-12 MED ORDER — PREDNISONE 20 MG PO TABS: 40.0000 mg | ORAL_TABLET | Freq: Every day | ORAL | 0 refills | Status: AC

## 2019-05-12 MED ORDER — PREDNISONE 50 MG PO TABS
60.0000 mg | ORAL_TABLET | Freq: Once | ORAL | Status: AC
  Administered 2019-05-12: 60 mg via ORAL
  Filled 2019-05-12: qty 1

## 2019-05-12 MED ORDER — IPRATROPIUM-ALBUTEROL 20-100 MCG/ACT IN AERS
2.0000 | INHALATION_SPRAY | Freq: Four times a day (QID) | RESPIRATORY_TRACT | Status: DC
  Administered 2019-05-12: 2 via RESPIRATORY_TRACT
  Filled 2019-05-12: qty 4

## 2019-05-12 NOTE — Discharge Instructions (Signed)
Please be sure to take your medication as prescribed: Prednisone for the next 4 days, albuterol every 4 hours for the next 2 days, and ipratropium each day for the next week.  Return here for concerning changes in your condition.

## 2019-05-12 NOTE — ED Triage Notes (Signed)
Pt states he has been coughing x 2 days and has been using his inhaler with no relief
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
## 2019-05-29 ENCOUNTER — Other Ambulatory Visit: Payer: Self-pay

## 2019-05-29 ENCOUNTER — Emergency Department (HOSPITAL_COMMUNITY)
Admission: EM | Admit: 2019-05-29 | Discharge: 2019-05-29 | Disposition: A | Discharge status: Home / Self Care | Payer: HRSA Program | Attending: Emergency Medicine | Admitting: Emergency Medicine

## 2019-05-29 ENCOUNTER — Encounter (HOSPITAL_COMMUNITY): Payer: Self-pay | Admitting: Emergency Medicine

## 2019-05-29 DIAGNOSIS — Z87891 Personal history of nicotine dependence: Secondary | ICD-10-CM | POA: Insufficient documentation

## 2019-05-29 DIAGNOSIS — Z79899 Other long term (current) drug therapy: Secondary | ICD-10-CM | POA: Diagnosis not present

## 2019-05-29 DIAGNOSIS — U071 COVID-19: Secondary | ICD-10-CM | POA: Insufficient documentation

## 2019-05-29 DIAGNOSIS — J4541 Moderate persistent asthma with (acute) exacerbation: Secondary | ICD-10-CM | POA: Diagnosis not present

## 2019-05-29 DIAGNOSIS — R05 Cough: Secondary | ICD-10-CM | POA: Diagnosis present

## 2019-05-29 MED ORDER — BENZONATATE 100 MG PO CAPS
200.0000 mg | ORAL_CAPSULE | Freq: Once | ORAL | Status: AC
  Administered 2019-05-29: 200 mg via ORAL
  Filled 2019-05-29: qty 2

## 2019-05-29 MED ORDER — ALBUTEROL SULFATE HFA 108 (90 BASE) MCG/ACT IN AERS
2.0000 | INHALATION_SPRAY | RESPIRATORY_TRACT | Status: AC
  Administered 2019-05-29: 2 via RESPIRATORY_TRACT
  Filled 2019-05-29: qty 6.7

## 2019-05-29 MED ORDER — AEROCHAMBER PLUS FLO-VU MEDIUM MISC
1.0000 | Freq: Once | Status: AC
  Administered 2019-05-29: 1
  Filled 2019-05-29: qty 1

## 2019-05-29 MED ORDER — BENZONATATE 100 MG PO CAPS: 100.0000 mg | ORAL_CAPSULE | Freq: Three times a day (TID) | ORAL | 0 refills | Status: DC

## 2019-05-29 NOTE — Discharge Instructions (Signed)
Albuterol as needed every 4 hours with the spacer Tessalon for coughing  ER for worsening symptoms.  Covid test should result in next 1-2 days.  [facility] Primary Care Doctor List    Sinda Du MD. Specialty: Pulmonary Disease Contact information: [address]  [place] [address]  [phone]   Tula Nakayama, MD. Specialty: [facility] Medicine Contact information: [address] [place] [address]  [phone]   Sallee Lange, MD. Specialty: [facility] Medicine Contact information: [address]  [place] [address]  [phone]   Rosita Fire, MD Specialty: Internal Medicine Contact information: [place] [address]  [phone]   Delphina Cahill, MD. Specialty: Internal Medicine Contact information: [place] [address]  [phone]    [facility] (Dr. Maudie Mercury) Specialty: Family Medicine Contact information: [place] [address]  [phone]   Leslie Andrea, MD. Specialty: [facility] Medicine Contact information: [place] [address]  [phone]   Asencion Noble, MD. Specialty: Internal Medicine Contact information: [address]  [place] [address]  [place]  [address] [place], [place] [address] [phone]  Services [facility] offers a variety of basic health services.  Services include but are not limited to: Blood pressure checks  Heart rate checks  Blood sugar checks  Urine analysis  Rapid strep tests  Pregnancy tests.  Health education and referrals  People needing more complex services will be directed to a physician online. Using these virtual visits, doctors can evaluate and prescribe medicine and treatments. There will be no medication on-site, though [facility] will help  patients fill their prescriptions at little to no cost.   For More information please go to: [email or web address]
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
## 2019-05-29 NOTE — ED Triage Notes (Signed)
Seen here on 05/12/2019 in ED.  Here today c/o headache, cough (productive but not sure of color), and sore throat.  Pt has he was given one inhaler on 12/11 but he need another inhaler.  Rates pain 7/10.

## 2019-05-30 LAB — SARS CORONAVIRUS 2 (TAT 6-24 HRS): SARS Coronavirus 2: POSITIVE — AB

## 2019-06-09 ENCOUNTER — Ambulatory Visit (HOSPITAL_COMMUNITY)
Admission: EM | Admit: 2019-06-09 | Discharge: 2019-06-09 | Disposition: A | Discharge status: Home / Self Care | Payer: Self-pay | Attending: Family Medicine | Admitting: Family Medicine

## 2019-06-09 ENCOUNTER — Ambulatory Visit (INDEPENDENT_AMBULATORY_CARE_PROVIDER_SITE_OTHER): Payer: Self-pay

## 2019-06-09 ENCOUNTER — Encounter (HOSPITAL_COMMUNITY): Payer: Self-pay

## 2019-06-09 DIAGNOSIS — S62666A Nondisplaced fracture of distal phalanx of right little finger, initial encounter for closed fracture: Secondary | ICD-10-CM

## 2019-06-09 DIAGNOSIS — M25511 Pain in right shoulder: Secondary | ICD-10-CM

## 2019-06-09 MED ORDER — IBUPROFEN 800 MG PO TABS
800.0000 mg | ORAL_TABLET | Freq: Once | ORAL | Status: AC
  Administered 2019-06-09: 19:00:00 800 mg via ORAL

## 2019-06-09 MED ORDER — IBUPROFEN 800 MG PO TABS: 800.0000 mg | ORAL_TABLET | Freq: Three times a day (TID) | ORAL | 0 refills | Status: DC

## 2019-06-09 MED ORDER — IBUPROFEN 800 MG PO TABS
ORAL_TABLET | ORAL | Status: AC
  Filled 2019-06-09: qty 1

## 2019-06-09 NOTE — Discharge Instructions (Signed)
The tip of your pinky is broken.  Please use splint as provided to keep it immobilized.  Ice, elevation to help with pain.  Activity as tolerated to right shoulder Please [facility] up with [facility] for recheck.
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
## 2019-06-21 ENCOUNTER — Encounter (HOSPITAL_COMMUNITY): Payer: Self-pay | Admitting: *Deleted

## 2019-06-21 ENCOUNTER — Emergency Department (HOSPITAL_COMMUNITY)
Admission: EM | Admit: 2019-06-21 | Discharge: 2019-06-21 | Disposition: A | Discharge status: Home / Self Care | Payer: Medicaid Other | Attending: Emergency Medicine | Admitting: Emergency Medicine

## 2019-06-21 ENCOUNTER — Other Ambulatory Visit: Payer: Self-pay

## 2019-06-21 DIAGNOSIS — Z79899 Other long term (current) drug therapy: Secondary | ICD-10-CM | POA: Insufficient documentation

## 2019-06-21 DIAGNOSIS — Z87891 Personal history of nicotine dependence: Secondary | ICD-10-CM | POA: Insufficient documentation

## 2019-06-21 DIAGNOSIS — J45909 Unspecified asthma, uncomplicated: Secondary | ICD-10-CM | POA: Insufficient documentation

## 2019-06-21 DIAGNOSIS — R05 Cough: Secondary | ICD-10-CM | POA: Insufficient documentation

## 2019-06-21 DIAGNOSIS — R059 Cough, unspecified: Secondary | ICD-10-CM

## 2019-06-21 MED ORDER — PREDNISONE 50 MG PO TABS
60.0000 mg | ORAL_TABLET | Freq: Once | ORAL | Status: AC
  Administered 2019-06-21: 60 mg via ORAL
  Filled 2019-06-21: qty 1

## 2019-06-21 MED ORDER — ALBUTEROL SULFATE HFA 108 (90 BASE) MCG/ACT IN AERS
2.0000 | INHALATION_SPRAY | Freq: Once | RESPIRATORY_TRACT | Status: AC
  Administered 2019-06-21: 05:00:00 2 via RESPIRATORY_TRACT
  Filled 2019-06-21: qty 6.7

## 2019-06-21 MED ORDER — PREDNISONE 20 MG PO TABS: ORAL_TABLET | ORAL | 0 refills | Status: DC

## 2019-06-21 NOTE — ED Triage Notes (Signed)
Pt c/o some sob that started yesterday with a headache as well; pt states he had covid x 3 weeks ago and states he has ran out of his inhalers
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
## 2019-07-06 ENCOUNTER — Ambulatory Visit (HOSPITAL_COMMUNITY)
Admission: EM | Admit: 2019-07-06 | Discharge: 2019-07-06 | Disposition: A | Discharge status: Home / Self Care | Payer: Self-pay | Attending: Family Medicine | Admitting: Family Medicine

## 2019-07-06 ENCOUNTER — Encounter (HOSPITAL_COMMUNITY): Payer: Self-pay

## 2019-07-06 ENCOUNTER — Other Ambulatory Visit: Payer: Self-pay

## 2019-07-06 ENCOUNTER — Ambulatory Visit (INDEPENDENT_AMBULATORY_CARE_PROVIDER_SITE_OTHER): Payer: Self-pay

## 2019-07-06 DIAGNOSIS — S63501A Unspecified sprain of right wrist, initial encounter: Secondary | ICD-10-CM

## 2019-07-06 DIAGNOSIS — M25431 Effusion, right wrist: Secondary | ICD-10-CM

## 2019-07-06 DIAGNOSIS — M25531 Pain in right wrist: Secondary | ICD-10-CM

## 2019-07-06 MED ORDER — IBUPROFEN 800 MG PO TABS: 800.0000 mg | ORAL_TABLET | Freq: Three times a day (TID) | ORAL | 0 refills | Status: DC

## 2019-07-06 NOTE — ED Notes (Signed)
Wrist splint applied to right wrist.
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
## 2019-07-06 NOTE — Discharge Instructions (Addendum)
Your x-ray does not show any fracture. We will give you a wrist splint to wear until you are feeling better.  Rest, ice the wrist and you can take ibuprofen every 8 hours for pain and inflammation. Follow up as needed for continued or worsening symptoms

## 2019-07-06 NOTE — ED Notes (Signed)
Patient on phone, unable to go to xray at this time

## 2019-07-06 NOTE — ED Triage Notes (Signed)
Pt states he was lifting a heavy box at work yesterday & states his wrist has been swollen ever since.

## 2019-08-19 ENCOUNTER — Ambulatory Visit (HOSPITAL_COMMUNITY)
Admission: EM | Admit: 2019-08-19 | Discharge: 2019-08-19 | Disposition: A | Discharge status: Home / Self Care | Payer: Medicaid Other | Attending: Family Medicine | Admitting: Family Medicine

## 2019-08-19 ENCOUNTER — Other Ambulatory Visit: Payer: Self-pay

## 2019-08-19 ENCOUNTER — Encounter (HOSPITAL_COMMUNITY): Payer: Self-pay | Admitting: *Deleted

## 2019-08-19 DIAGNOSIS — J40 Bronchitis, not specified as acute or chronic: Secondary | ICD-10-CM | POA: Insufficient documentation

## 2019-08-19 DIAGNOSIS — R369 Urethral discharge, unspecified: Secondary | ICD-10-CM | POA: Insufficient documentation

## 2019-08-19 MED ORDER — ALBUTEROL SULFATE HFA 108 (90 BASE) MCG/ACT IN AERS: 1.0000 | INHALATION_SPRAY | Freq: Four times a day (QID) | RESPIRATORY_TRACT | 1 refills | Status: DC | PRN

## 2019-08-19 MED ORDER — PREDNISONE 20 MG PO TABS: ORAL_TABLET | ORAL | 0 refills | Status: DC

## 2019-08-19 MED ORDER — LIDOCAINE HCL (PF) 1 % IJ SOLN
INTRAMUSCULAR | Status: AC
  Filled 2019-08-19: qty 2

## 2019-08-19 MED ORDER — CEFTRIAXONE SODIUM 500 MG IJ SOLR
INTRAMUSCULAR | Status: AC
  Filled 2019-08-19: qty 500

## 2019-08-19 MED ORDER — CETIRIZINE HCL 10 MG PO TABS: 10.0000 mg | ORAL_TABLET | Freq: Every day | ORAL | 0 refills | Status: DC

## 2019-08-19 MED ORDER — CEFTRIAXONE SODIUM 500 MG IJ SOLR
500.0000 mg | Freq: Once | INTRAMUSCULAR | Status: AC
  Administered 2019-08-19: 500 mg via INTRAMUSCULAR

## 2019-08-19 NOTE — ED Triage Notes (Signed)
Reports being out of inhaler and does not have PCP.  Denies any cough, but c/o feeling like he's having an asthma flare-up.  Also c/o penile discharge x 4 days.  Denies fevers.
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
## 2019-08-19 NOTE — Discharge Instructions (Addendum)
Treating you for bronchitis.  Take the medication as prescribed. We will send the swab for testing and call you with any positive results. Follow up as needed for continued or worsening symptoms

## 2019-08-22 LAB — CYTOLOGY, (ORAL, ANAL, URETHRAL) ANCILLARY ONLY
Chlamydia: NEGATIVE
Neisseria Gonorrhea: POSITIVE — AB
Trichomonas: NEGATIVE

## 2019-10-10 ENCOUNTER — Encounter (HOSPITAL_COMMUNITY): Payer: Self-pay

## 2019-10-10 ENCOUNTER — Emergency Department (HOSPITAL_COMMUNITY)
Admission: EM | Admit: 2019-10-10 | Discharge: 2019-10-10 | Disposition: A | Discharge status: Home / Self Care | Payer: Medicaid Other | Attending: Emergency Medicine | Admitting: Emergency Medicine

## 2019-10-10 ENCOUNTER — Other Ambulatory Visit: Payer: Self-pay

## 2019-10-10 DIAGNOSIS — Z87891 Personal history of nicotine dependence: Secondary | ICD-10-CM | POA: Insufficient documentation

## 2019-10-10 DIAGNOSIS — Z760 Encounter for issue of repeat prescription: Secondary | ICD-10-CM | POA: Insufficient documentation

## 2019-10-10 DIAGNOSIS — Z79899 Other long term (current) drug therapy: Secondary | ICD-10-CM | POA: Insufficient documentation

## 2019-10-10 DIAGNOSIS — I10 Essential (primary) hypertension: Secondary | ICD-10-CM | POA: Insufficient documentation

## 2019-10-10 DIAGNOSIS — J45909 Unspecified asthma, uncomplicated: Secondary | ICD-10-CM | POA: Insufficient documentation

## 2019-10-10 DIAGNOSIS — R519 Headache, unspecified: Secondary | ICD-10-CM | POA: Insufficient documentation

## 2019-10-10 MED ORDER — AMLODIPINE BESYLATE 5 MG PO TABS: 5.0000 mg | ORAL_TABLET | Freq: Every day | ORAL | 0 refills | Status: DC

## 2019-10-10 MED ORDER — ALBUTEROL SULFATE HFA 108 (90 BASE) MCG/ACT IN AERS
2.0000 | INHALATION_SPRAY | Freq: Once | RESPIRATORY_TRACT | Status: AC
  Administered 2019-10-10: 2 via RESPIRATORY_TRACT
  Filled 2019-10-10: qty 6.7

## 2019-10-10 MED ORDER — KETOROLAC TROMETHAMINE 30 MG/ML IJ SOLN
30.0000 mg | Freq: Once | INTRAMUSCULAR | Status: DC
  Filled 2019-10-10: qty 1

## 2019-10-10 MED ORDER — IBUPROFEN 400 MG PO TABS
600.0000 mg | ORAL_TABLET | Freq: Once | ORAL | Status: AC
  Administered 2019-10-10: 600 mg via ORAL
  Filled 2019-10-10: qty 2

## 2019-10-10 NOTE — Discharge Instructions (Addendum)
You were seen today for headache.  Your exam is reassuring.  You did have an elevated blood pressure at 169/100.  This could be some of the cause of your symptoms.  You will be started on a low-dose of blood pressure medication.  You should follow-up to establish care for primary physician.  If you develop worst headache of your life, acute nausea, vomiting, worsening of symptoms you should be reevaluated.

## 2019-10-10 NOTE — ED Triage Notes (Signed)
Pt comes POV from home with complains for migraine x2 weeks worse with bright light. Tried tylenol 500mg  with no help. he has never had one before. Has an appt with health dept in a few weeks to be checked out for anxiety.  Said his headache is making him anxious so he came in. 10/10 pain    [facility]  pharmacy
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
## 2019-10-25 ENCOUNTER — Encounter (HOSPITAL_COMMUNITY): Payer: Self-pay | Admitting: Emergency Medicine

## 2019-10-25 ENCOUNTER — Emergency Department (HOSPITAL_COMMUNITY)
Admission: EM | Admit: 2019-10-25 | Discharge: 2019-10-25 | Disposition: A | Discharge status: Home / Self Care | Payer: Medicaid Other | Attending: Emergency Medicine | Admitting: Emergency Medicine

## 2019-10-25 ENCOUNTER — Other Ambulatory Visit: Payer: Self-pay

## 2019-10-25 DIAGNOSIS — Z20822 Contact with and (suspected) exposure to covid-19: Secondary | ICD-10-CM | POA: Insufficient documentation

## 2019-10-25 DIAGNOSIS — Z87891 Personal history of nicotine dependence: Secondary | ICD-10-CM | POA: Insufficient documentation

## 2019-10-25 DIAGNOSIS — J45901 Unspecified asthma with (acute) exacerbation: Secondary | ICD-10-CM

## 2019-10-25 LAB — SARS CORONAVIRUS 2 BY RT PCR (HOSPITAL ORDER, PERFORMED IN ~~LOC~~ HOSPITAL LAB): SARS Coronavirus 2: NEGATIVE

## 2019-10-25 MED ORDER — ALBUTEROL SULFATE HFA 108 (90 BASE) MCG/ACT IN AERS
2.0000 | INHALATION_SPRAY | Freq: Once | RESPIRATORY_TRACT | Status: AC
  Administered 2019-10-25: 2 via RESPIRATORY_TRACT
  Filled 2019-10-25: qty 6.7

## 2019-10-25 MED ORDER — DEXAMETHASONE 4 MG PO TABS
12.0000 mg | ORAL_TABLET | Freq: Once | ORAL | Status: AC
  Administered 2019-10-25: 12 mg via ORAL
  Filled 2019-10-25: qty 3

## 2019-10-25 MED ORDER — IPRATROPIUM-ALBUTEROL 0.5-2.5 (3) MG/3ML IN SOLN: 3.0000 mL | Freq: Once | RESPIRATORY_TRACT | Status: DC

## 2019-10-25 MED ORDER — ALBUTEROL SULFATE HFA 108 (90 BASE) MCG/ACT IN AERS: 2.0000 | INHALATION_SPRAY | RESPIRATORY_TRACT | 3 refills | Status: DC | PRN

## 2019-10-25 MED ORDER — DEXAMETHASONE 4 MG PO TABS: 8.0000 mg | ORAL_TABLET | Freq: Two times a day (BID) | ORAL | 0 refills | Status: DC

## 2019-10-25 NOTE — ED Triage Notes (Addendum)
Per EMS, pt reports history of asthma and reports "asthma attack and anxiety" this am. Pt reports used inhaler with minimal change. Pt 97% on room air with EMS. Wheezing all lobes auscultated with EMS.   Pt reports "the anxiety attack this morning woke me up and that's different for me." pt denies pain. nad noted.
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
## 2019-11-24 ENCOUNTER — Other Ambulatory Visit: Payer: Self-pay

## 2019-11-24 ENCOUNTER — Emergency Department (HOSPITAL_COMMUNITY)
Admission: EM | Admit: 2019-11-24 | Discharge: 2019-11-24 | Disposition: A | Discharge status: Home / Self Care | Payer: Self-pay | Attending: Emergency Medicine | Admitting: Emergency Medicine

## 2019-11-24 ENCOUNTER — Encounter (HOSPITAL_COMMUNITY): Payer: Self-pay | Admitting: *Deleted

## 2019-11-24 ENCOUNTER — Emergency Department (HOSPITAL_COMMUNITY): Payer: Self-pay

## 2019-11-24 DIAGNOSIS — J4521 Mild intermittent asthma with (acute) exacerbation: Secondary | ICD-10-CM | POA: Insufficient documentation

## 2019-11-24 DIAGNOSIS — Z87891 Personal history of nicotine dependence: Secondary | ICD-10-CM | POA: Insufficient documentation

## 2019-11-24 DIAGNOSIS — Z79899 Other long term (current) drug therapy: Secondary | ICD-10-CM | POA: Insufficient documentation

## 2019-11-24 MED ORDER — ALBUTEROL SULFATE HFA 108 (90 BASE) MCG/ACT IN AERS
2.0000 | INHALATION_SPRAY | Freq: Four times a day (QID) | RESPIRATORY_TRACT | Status: DC
  Administered 2019-11-24: 2 via RESPIRATORY_TRACT
  Filled 2019-11-24: qty 6.7

## 2019-11-24 NOTE — ED Triage Notes (Signed)
Patient reports shortness of breath for 30 minutes.  Patient has an inhaler that is empty and reports that he needs a new one.  Patient also reports a dry cough that began 15 minutes ago.

## 2019-11-24 NOTE — Discharge Instructions (Addendum)
Use your albuterol inhaler 2 puffs every 6 hours for the next 7 days and then as needed.  Return for any new or worse symptoms.  EKG and chest x-ray today without any significant findings.
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
## 2019-12-01 ENCOUNTER — Emergency Department (HOSPITAL_COMMUNITY): Payer: Self-pay

## 2019-12-01 ENCOUNTER — Encounter (HOSPITAL_COMMUNITY): Payer: Self-pay

## 2019-12-01 ENCOUNTER — Other Ambulatory Visit: Payer: Self-pay

## 2019-12-01 ENCOUNTER — Emergency Department (HOSPITAL_COMMUNITY)
Admission: EM | Admit: 2019-12-01 | Discharge: 2019-12-01 | Disposition: A | Discharge status: Home / Self Care | Payer: Self-pay | Attending: Emergency Medicine | Admitting: Emergency Medicine

## 2019-12-01 DIAGNOSIS — Z87891 Personal history of nicotine dependence: Secondary | ICD-10-CM | POA: Insufficient documentation

## 2019-12-01 DIAGNOSIS — J4541 Moderate persistent asthma with (acute) exacerbation: Secondary | ICD-10-CM | POA: Insufficient documentation

## 2019-12-01 MED ORDER — ALBUTEROL SULFATE HFA 108 (90 BASE) MCG/ACT IN AERS
4.0000 | INHALATION_SPRAY | Freq: Once | RESPIRATORY_TRACT | Status: AC
  Administered 2019-12-01: 4 via RESPIRATORY_TRACT
  Filled 2019-12-01: qty 6.7

## 2019-12-01 MED ORDER — AMLODIPINE BESYLATE 5 MG PO TABS: 5.0000 mg | ORAL_TABLET | Freq: Every day | ORAL | 0 refills | Status: DC

## 2019-12-01 MED ORDER — PREDNISONE 50 MG PO TABS
60.0000 mg | ORAL_TABLET | Freq: Once | ORAL | Status: AC
  Administered 2019-12-01: 60 mg via ORAL
  Filled 2019-12-01: qty 1

## 2019-12-01 MED ORDER — FLUTICASONE PROPIONATE (INHAL) 55 MCG/ACT IN AEPB: 1.0000 | INHALATION_SPRAY | Freq: Two times a day (BID) | RESPIRATORY_TRACT | 0 refills | Status: DC

## 2019-12-01 MED ORDER — MONTELUKAST SODIUM 10 MG PO TABS: 10.0000 mg | ORAL_TABLET | Freq: Every day | ORAL | 0 refills | Status: DC

## 2019-12-01 MED ORDER — PREDNISONE 20 MG PO TABS: 40.0000 mg | ORAL_TABLET | Freq: Every day | ORAL | 0 refills | Status: DC

## 2019-12-01 NOTE — Discharge Instructions (Signed)
Continue using your inhaler as needed. 

## 2019-12-01 NOTE — ED Triage Notes (Signed)
Pt states his asthma has been worse over the past several weeks, using his inhalers without much relief.  Pt now with a cough also.

## 2019-12-01 NOTE — ED Notes (Signed)
Called to pt's room who stated that he felt like his breathing and coughing was getting worse. Pt stated that he was anxious because he felt "pressure in his chest". Explained to pt that I would let MD know. Pt became angry and stated that "he was told that 2 hrs ago". Dr. Preston Fleeting notified.
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
## 2019-12-03 IMAGING — DX DG CHEST 1V
1 series · 1 of 1 positions shown · non-contrast
Comparison: 09/27/2018

CLINICAL DATA: Cough

EXAM:
CHEST  1 VIEW

[chest pa]
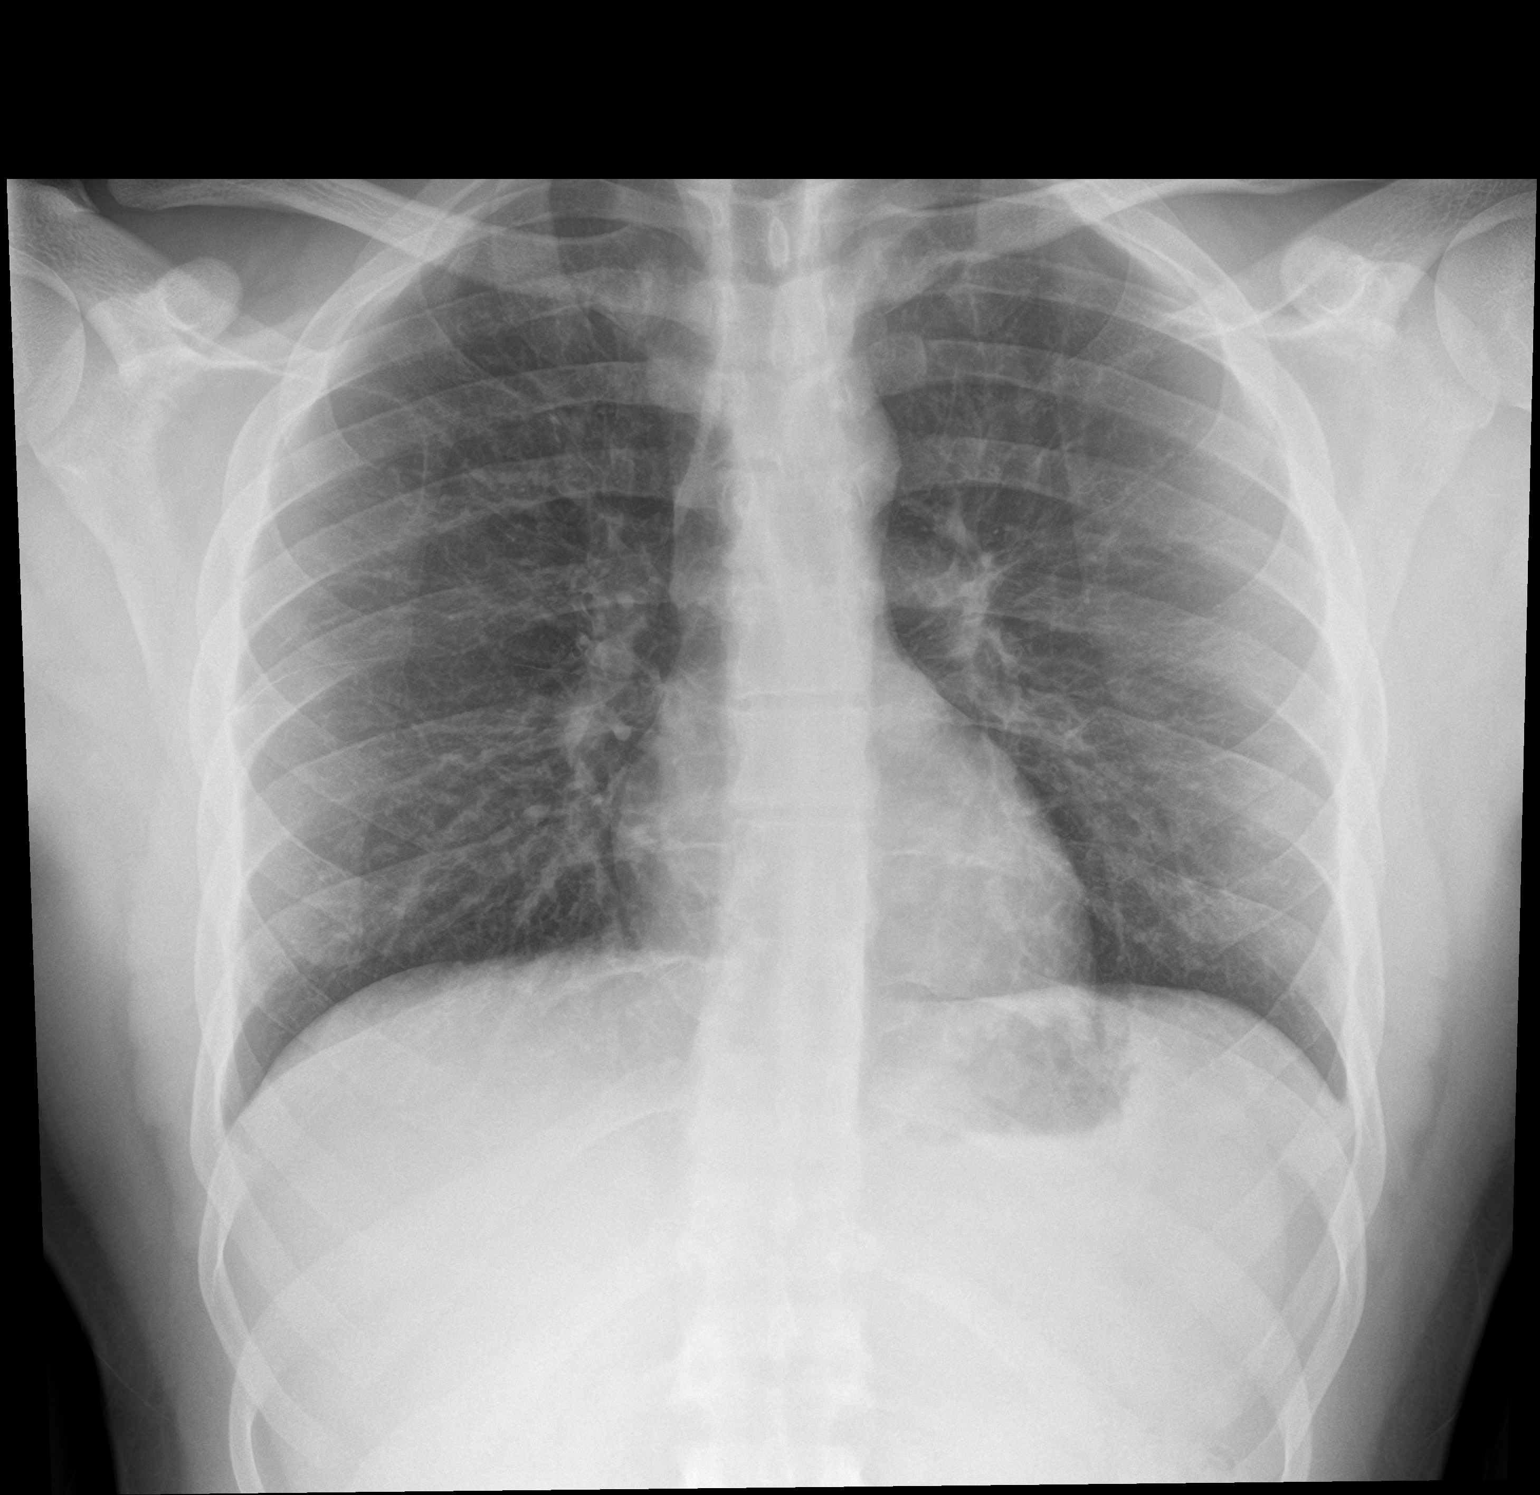

[1 of 1 positions shown; findings below may reference images not displayed]

FINDINGS: The heart size and mediastinal contours are within normal limits.
Both lungs are clear. The visualized skeletal structures are
unremarkable.
IMPRESSION: No acute abnormality of the lungs in AP portable projection.

## 2019-12-14 ENCOUNTER — Emergency Department (HOSPITAL_COMMUNITY)
Admission: EM | Admit: 2019-12-14 | Discharge: 2019-12-14 | Disposition: A | Discharge status: Home / Self Care | Payer: Self-pay | Attending: Emergency Medicine | Admitting: Emergency Medicine

## 2019-12-14 ENCOUNTER — Other Ambulatory Visit: Payer: Self-pay

## 2019-12-14 ENCOUNTER — Emergency Department (HOSPITAL_COMMUNITY): Payer: Self-pay

## 2019-12-14 ENCOUNTER — Encounter (HOSPITAL_COMMUNITY): Payer: Self-pay

## 2019-12-14 DIAGNOSIS — J45909 Unspecified asthma, uncomplicated: Secondary | ICD-10-CM | POA: Insufficient documentation

## 2019-12-14 DIAGNOSIS — R05 Cough: Secondary | ICD-10-CM | POA: Insufficient documentation

## 2019-12-14 DIAGNOSIS — Z5321 Procedure and treatment not carried out due to patient leaving prior to being seen by health care provider: Secondary | ICD-10-CM | POA: Insufficient documentation

## 2019-12-14 NOTE — ED Triage Notes (Signed)
Pt presents to ED with productive cough. Pt states his asthma has been acting up since he woke up this morning. Pt states he called EMS and they gave him a breathing treatment and he feels a little better since then. Pt states he has a rx for prednisone at the pharmacy but hasn't picked up yet. Pt states "I came today in hopes to get another inhaler and a dose of prednisone until I can get my prescription from the pharmacy."

## 2019-12-14 NOTE — ED Notes (Signed)
Pt informed we don't have any rooms at the present time but entered an xray to check for him. Pt states "I'm just going to go home." Encouraged pt to stay pt states "I will just come back if I get worse."

## 2019-12-22 ENCOUNTER — Other Ambulatory Visit: Payer: Self-pay

## 2019-12-22 ENCOUNTER — Encounter (HOSPITAL_COMMUNITY): Payer: Self-pay

## 2019-12-22 ENCOUNTER — Emergency Department (HOSPITAL_COMMUNITY)
Admission: EM | Admit: 2019-12-22 | Discharge: 2019-12-22 | Disposition: A | Discharge status: Home / Self Care | Payer: Medicaid Other | Attending: Emergency Medicine | Admitting: Emergency Medicine

## 2019-12-22 DIAGNOSIS — Z7951 Long term (current) use of inhaled steroids: Secondary | ICD-10-CM | POA: Insufficient documentation

## 2019-12-22 DIAGNOSIS — Z79899 Other long term (current) drug therapy: Secondary | ICD-10-CM | POA: Insufficient documentation

## 2019-12-22 DIAGNOSIS — J4531 Mild persistent asthma with (acute) exacerbation: Secondary | ICD-10-CM

## 2019-12-22 DIAGNOSIS — Z87891 Personal history of nicotine dependence: Secondary | ICD-10-CM | POA: Insufficient documentation

## 2019-12-22 MED ORDER — PREDNISONE 50 MG PO TABS
60.0000 mg | ORAL_TABLET | Freq: Once | ORAL | Status: AC
  Administered 2019-12-22: 60 mg via ORAL
  Filled 2019-12-22: qty 1

## 2019-12-22 MED ORDER — PREDNISONE 20 MG PO TABS: ORAL_TABLET | ORAL | 0 refills | Status: DC

## 2019-12-22 MED ORDER — ALBUTEROL SULFATE HFA 108 (90 BASE) MCG/ACT IN AERS
2.0000 | INHALATION_SPRAY | Freq: Once | RESPIRATORY_TRACT | Status: AC
  Administered 2019-12-22: 2 via RESPIRATORY_TRACT
  Filled 2019-12-22: qty 6.7

## 2019-12-22 NOTE — ED Notes (Signed)
Pt states that he is breathing better, states that he is ready to go home, pa at chair side, states that pt's lungs are clear.
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
## 2019-12-22 NOTE — Discharge Instructions (Signed)
Take your next dose of prednisone tomorrow evening.  Continue using your inhaler 2 puffs every 4 hours if needed for wheezing or shortness of breath.  Return here for any worsening symptoms.

## 2019-12-22 NOTE — ED Triage Notes (Signed)
Pt to er, pt states that he is out of his inhaler, states that he doesn't have any money to buy another one and knows that he will get a dose here, states that he was here last night for the same but couldn't wait any longer.  Pt talking on the phone in triage, pt talking in full sentences.

## 2020-01-10 ENCOUNTER — Institutional Professional Consult (permissible substitution): Payer: Medicaid Other | Admitting: Internal Medicine

## 2020-01-23 ENCOUNTER — Emergency Department (HOSPITAL_COMMUNITY)
Admission: EM | Admit: 2020-01-23 | Discharge: 2020-01-23 | Disposition: A | Discharge status: Home / Self Care | Payer: Self-pay | Attending: Emergency Medicine | Admitting: Emergency Medicine

## 2020-01-23 ENCOUNTER — Encounter (HOSPITAL_COMMUNITY): Payer: Self-pay | Admitting: Emergency Medicine

## 2020-01-23 ENCOUNTER — Emergency Department (HOSPITAL_COMMUNITY): Payer: Self-pay

## 2020-01-23 ENCOUNTER — Other Ambulatory Visit: Payer: Self-pay

## 2020-01-23 DIAGNOSIS — R05 Cough: Secondary | ICD-10-CM | POA: Insufficient documentation

## 2020-01-23 DIAGNOSIS — R0602 Shortness of breath: Secondary | ICD-10-CM | POA: Insufficient documentation

## 2020-01-23 DIAGNOSIS — R0981 Nasal congestion: Secondary | ICD-10-CM | POA: Insufficient documentation

## 2020-01-23 DIAGNOSIS — R5383 Other fatigue: Secondary | ICD-10-CM | POA: Insufficient documentation

## 2020-01-23 DIAGNOSIS — J45909 Unspecified asthma, uncomplicated: Secondary | ICD-10-CM | POA: Insufficient documentation

## 2020-01-23 DIAGNOSIS — R509 Fever, unspecified: Secondary | ICD-10-CM | POA: Insufficient documentation

## 2020-01-23 DIAGNOSIS — Z87891 Personal history of nicotine dependence: Secondary | ICD-10-CM | POA: Insufficient documentation

## 2020-01-23 DIAGNOSIS — Z79899 Other long term (current) drug therapy: Secondary | ICD-10-CM | POA: Insufficient documentation

## 2020-01-23 DIAGNOSIS — Z20822 Contact with and (suspected) exposure to covid-19: Secondary | ICD-10-CM | POA: Insufficient documentation

## 2020-01-23 DIAGNOSIS — R519 Headache, unspecified: Secondary | ICD-10-CM | POA: Insufficient documentation

## 2020-01-23 DIAGNOSIS — J029 Acute pharyngitis, unspecified: Secondary | ICD-10-CM | POA: Insufficient documentation

## 2020-01-23 DIAGNOSIS — R6889 Other general symptoms and signs: Secondary | ICD-10-CM

## 2020-01-23 LAB — SARS CORONAVIRUS 2 BY RT PCR (HOSPITAL ORDER, PERFORMED IN ~~LOC~~ HOSPITAL LAB): SARS Coronavirus 2: NEGATIVE

## 2020-01-23 MED ORDER — METHYLPREDNISOLONE 4 MG PO TBPK: ORAL_TABLET | ORAL | 0 refills | Status: DC

## 2020-01-23 MED ORDER — ALBUTEROL SULFATE HFA 108 (90 BASE) MCG/ACT IN AERS
2.0000 | INHALATION_SPRAY | Freq: Once | RESPIRATORY_TRACT | Status: AC
  Administered 2020-01-23: 2 via RESPIRATORY_TRACT
  Filled 2020-01-23: qty 6.7

## 2020-01-23 MED ORDER — NAPROXEN 375 MG PO TABS: 375.0000 mg | ORAL_TABLET | Freq: Two times a day (BID) | ORAL | 0 refills | Status: DC

## 2020-01-23 MED ORDER — ALBUTEROL SULFATE HFA 108 (90 BASE) MCG/ACT IN AERS
1.0000 | INHALATION_SPRAY | Freq: Four times a day (QID) | RESPIRATORY_TRACT | 0 refills | Status: DC | PRN
Start: 2020-01-23 — End: 2020-02-11

## 2020-01-23 MED ORDER — ACETAMINOPHEN 325 MG PO TABS
650.0000 mg | ORAL_TABLET | Freq: Once | ORAL | Status: AC | PRN
  Administered 2020-01-23: 650 mg via ORAL
  Filled 2020-01-23: qty 2

## 2020-01-23 NOTE — Discharge Instructions (Addendum)
You appear to have an upper respiratory infection (URI). An upper respiratory tract infection, or cold, is a viral infection of the air passages leading to the lungs. It is contagious and can be spread to others, especially during the first 3 or 4 days. It cannot be cured by antibiotics or other medicines. °RETURN IMMEDIATELY IF you develop shortness of breath, confusion or altered mental status, a new rash, become dizzy, faint, or poorly responsive, or are unable to be cared for at home. ° °
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
## 2020-01-23 NOTE — ED Triage Notes (Signed)
Pt c/o flu-like symptoms for the past 3 days.

## 2020-02-11 ENCOUNTER — Emergency Department (HOSPITAL_COMMUNITY): Payer: Self-pay

## 2020-02-11 ENCOUNTER — Encounter (HOSPITAL_COMMUNITY): Payer: Self-pay

## 2020-02-11 ENCOUNTER — Other Ambulatory Visit: Payer: Self-pay

## 2020-02-11 ENCOUNTER — Emergency Department (HOSPITAL_COMMUNITY)
Admission: EM | Admit: 2020-02-11 | Discharge: 2020-02-11 | Disposition: A | Discharge status: Home / Self Care | Payer: Self-pay | Attending: Emergency Medicine | Admitting: Emergency Medicine

## 2020-02-11 DIAGNOSIS — Z87891 Personal history of nicotine dependence: Secondary | ICD-10-CM | POA: Insufficient documentation

## 2020-02-11 DIAGNOSIS — Z79899 Other long term (current) drug therapy: Secondary | ICD-10-CM | POA: Insufficient documentation

## 2020-02-11 DIAGNOSIS — F419 Anxiety disorder, unspecified: Secondary | ICD-10-CM | POA: Insufficient documentation

## 2020-02-11 DIAGNOSIS — J45901 Unspecified asthma with (acute) exacerbation: Secondary | ICD-10-CM

## 2020-02-11 DIAGNOSIS — Z20822 Contact with and (suspected) exposure to covid-19: Secondary | ICD-10-CM | POA: Insufficient documentation

## 2020-02-11 DIAGNOSIS — F1590 Other stimulant use, unspecified, uncomplicated: Secondary | ICD-10-CM | POA: Insufficient documentation

## 2020-02-11 DIAGNOSIS — J4521 Mild intermittent asthma with (acute) exacerbation: Secondary | ICD-10-CM | POA: Insufficient documentation

## 2020-02-11 LAB — SARS CORONAVIRUS 2 BY RT PCR (HOSPITAL ORDER, PERFORMED IN ~~LOC~~ HOSPITAL LAB): SARS Coronavirus 2: NEGATIVE

## 2020-02-11 MED ORDER — PREDNISONE 50 MG PO TABS: ORAL_TABLET | ORAL | 0 refills | Status: DC

## 2020-02-11 MED ORDER — PREDNISONE 50 MG PO TABS
60.0000 mg | ORAL_TABLET | Freq: Once | ORAL | Status: AC
  Administered 2020-02-11: 60 mg via ORAL
  Filled 2020-02-11: qty 1

## 2020-02-11 MED ORDER — ALBUTEROL SULFATE HFA 108 (90 BASE) MCG/ACT IN AERS: 1.0000 | INHALATION_SPRAY | Freq: Four times a day (QID) | RESPIRATORY_TRACT | 0 refills | Status: DC | PRN

## 2020-02-11 MED ORDER — ALBUTEROL SULFATE HFA 108 (90 BASE) MCG/ACT IN AERS
2.0000 | INHALATION_SPRAY | Freq: Once | RESPIRATORY_TRACT | Status: AC
  Administered 2020-02-11: 2 via RESPIRATORY_TRACT
  Filled 2020-02-11: qty 6.7

## 2020-02-11 NOTE — ED Notes (Signed)
aerochamber given with inhaler.

## 2020-02-11 NOTE — ED Triage Notes (Signed)
Pt reports woke up during this morning feeling sob and anxious.  Denies any pain.  Reports cough.  Reports was tested for covid last week and reports was negative.
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
## 2020-02-25 IMAGING — DX DG WRIST COMPLETE 3+V*R*
4 series · 4 of 4 positions shown · non-contrast
Comparison: 06/09/2019

CLINICAL DATA: Pain and swelling.  Lifting injury.

EXAM:
RIGHT WRIST - COMPLETE 3+ VIEW

[wrist pa]
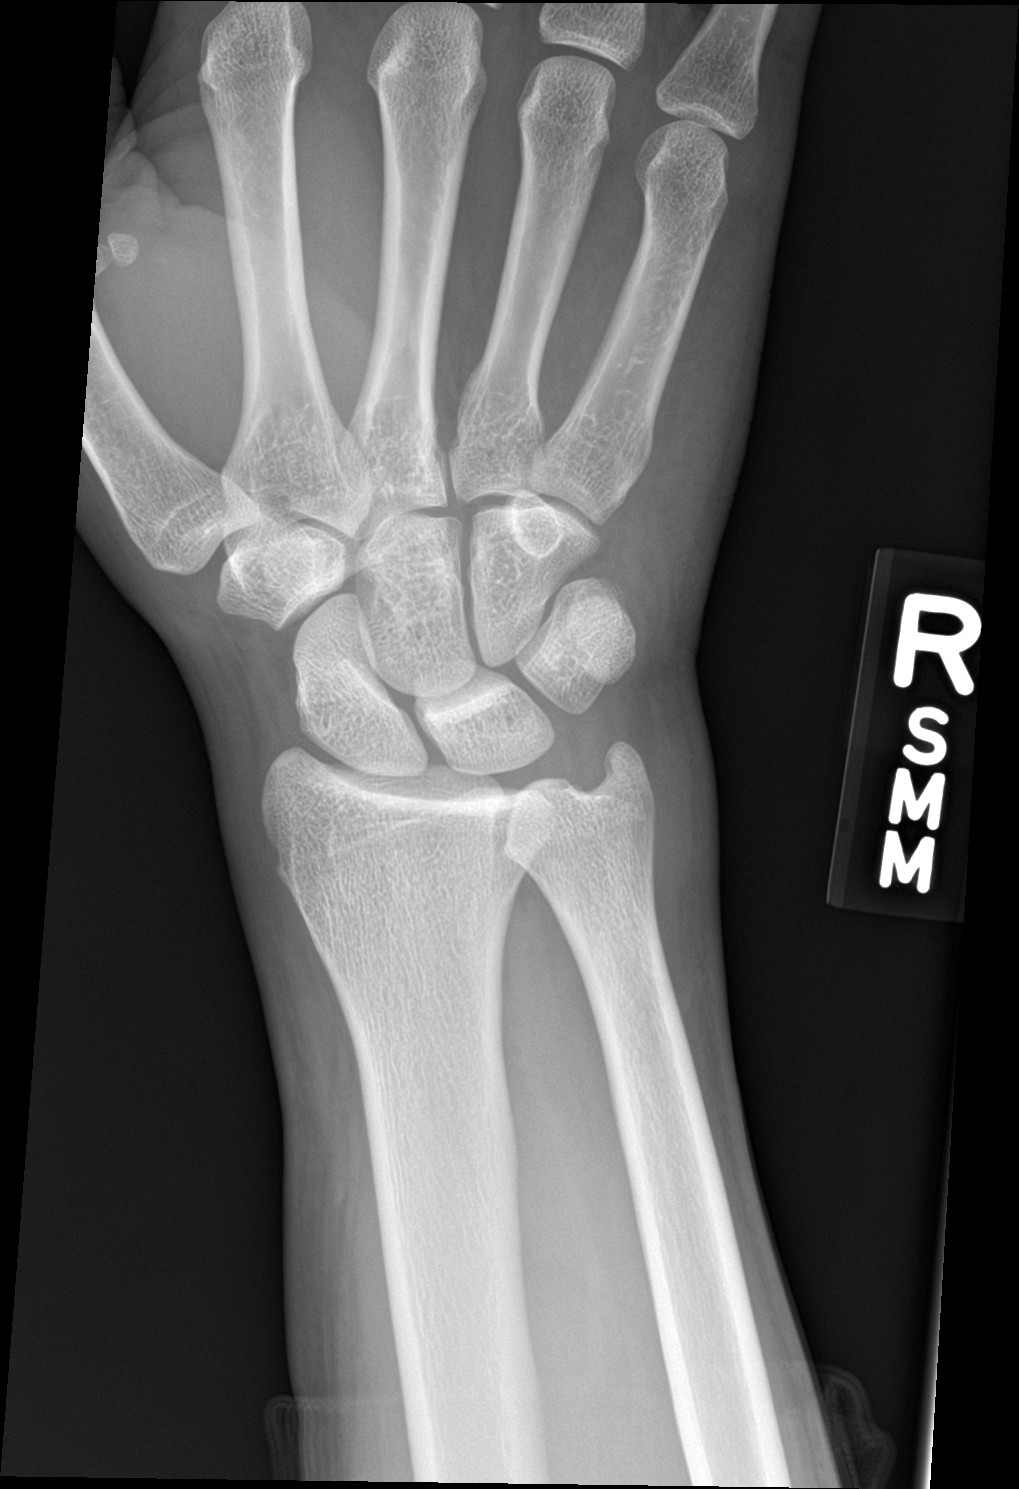

[wrist navicular]
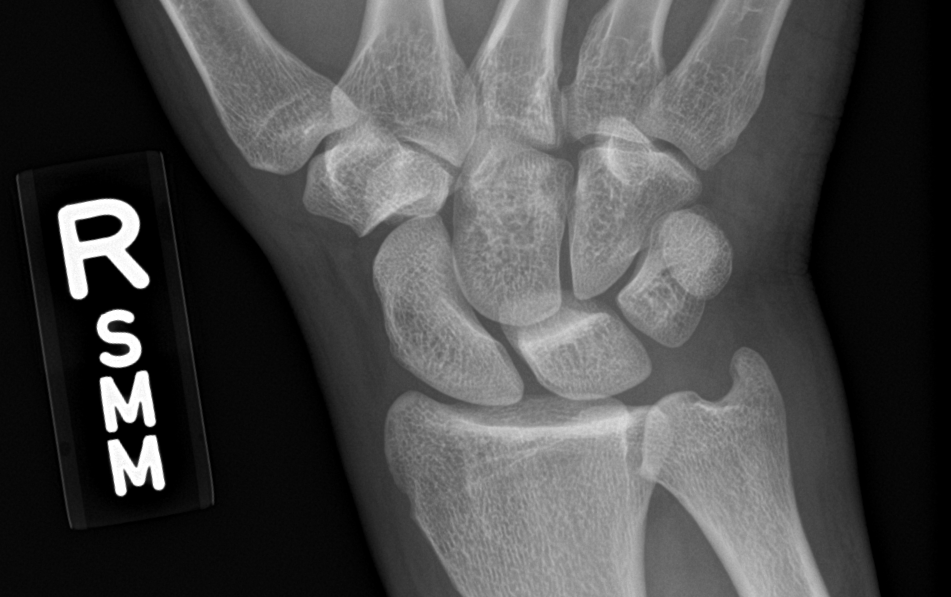

[wrist obl]
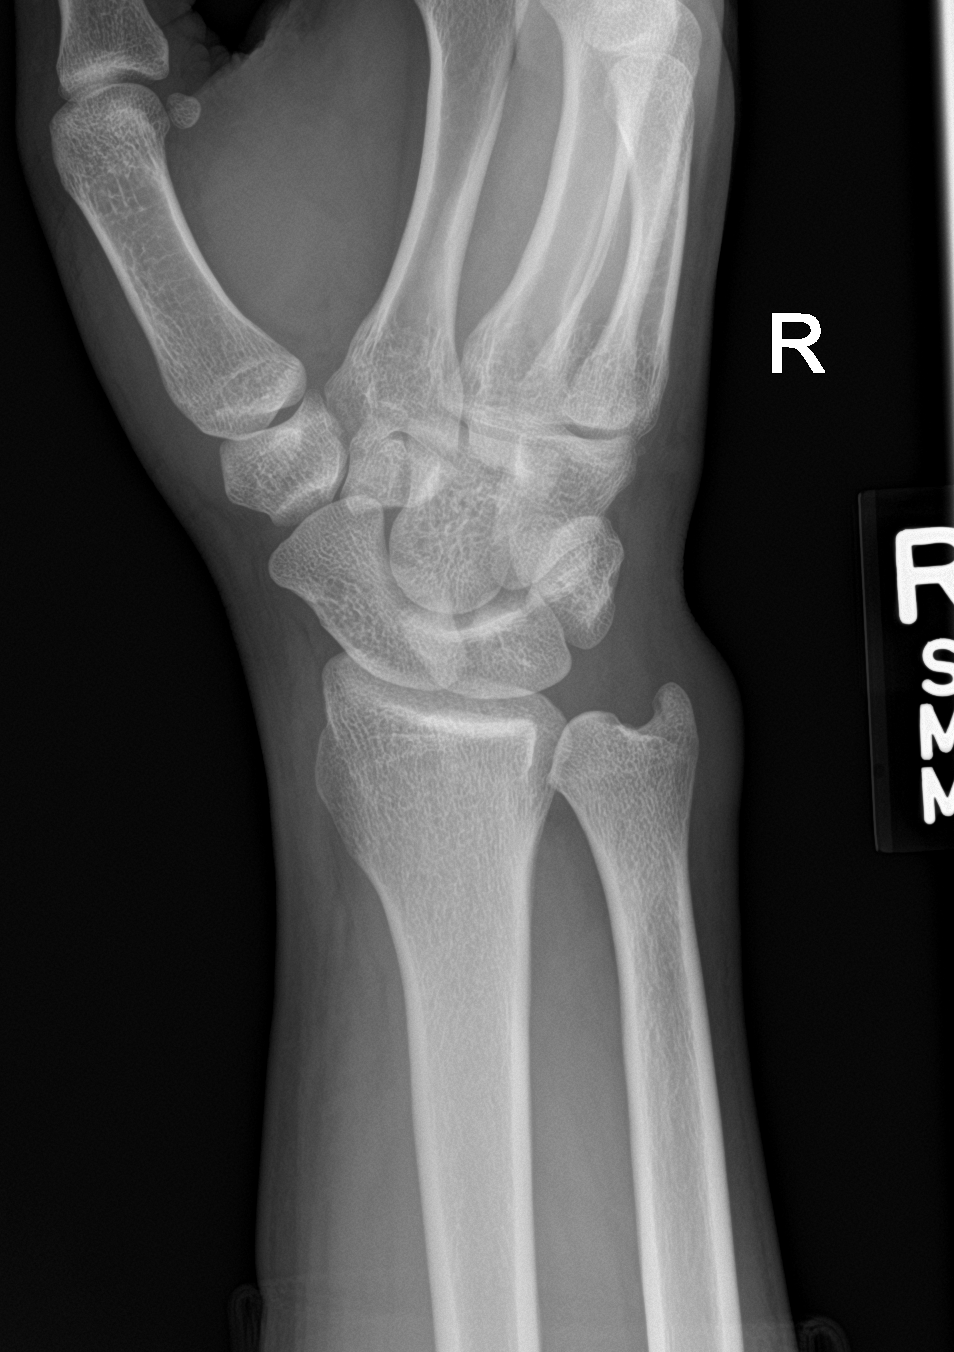

[wrist lat]
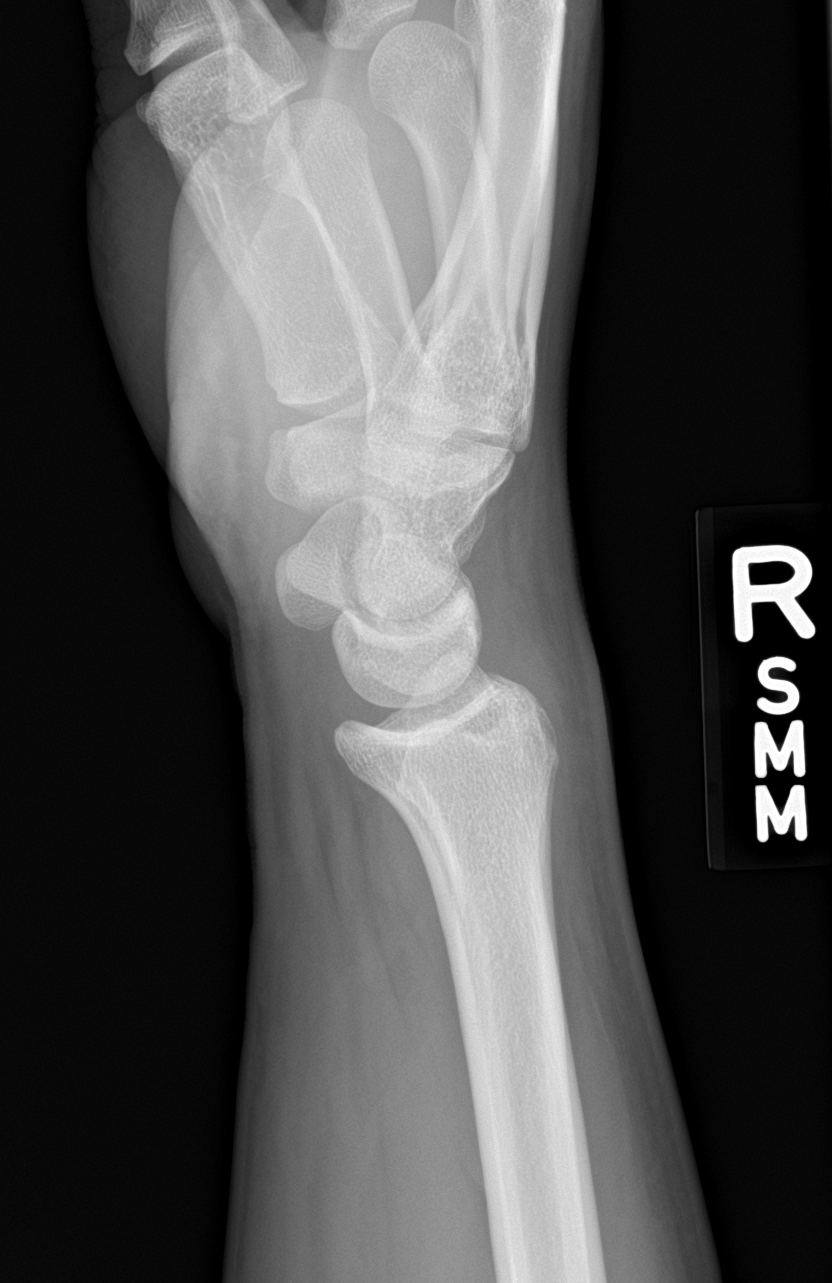

[4 of 4 positions shown; findings below may reference images not displayed]

FINDINGS: There is no evidence of fracture or dislocation. There is no
evidence of arthropathy or other focal bone abnormality. Soft
tissues are unremarkable.
IMPRESSION: Negative.

## 2020-03-19 ENCOUNTER — Encounter (HOSPITAL_COMMUNITY): Payer: Self-pay | Admitting: *Deleted

## 2020-03-19 ENCOUNTER — Other Ambulatory Visit: Payer: Self-pay

## 2020-03-19 ENCOUNTER — Emergency Department (HOSPITAL_COMMUNITY)
Admission: EM | Admit: 2020-03-19 | Discharge: 2020-03-19 | Disposition: A | Discharge status: Home / Self Care | Payer: Self-pay | Attending: Emergency Medicine | Admitting: Emergency Medicine

## 2020-03-19 ENCOUNTER — Emergency Department (HOSPITAL_COMMUNITY): Payer: Self-pay

## 2020-03-19 DIAGNOSIS — J180 Bronchopneumonia, unspecified organism: Secondary | ICD-10-CM | POA: Insufficient documentation

## 2020-03-19 DIAGNOSIS — Z87891 Personal history of nicotine dependence: Secondary | ICD-10-CM | POA: Insufficient documentation

## 2020-03-19 DIAGNOSIS — J4521 Mild intermittent asthma with (acute) exacerbation: Secondary | ICD-10-CM | POA: Insufficient documentation

## 2020-03-19 DIAGNOSIS — Z20822 Contact with and (suspected) exposure to covid-19: Secondary | ICD-10-CM | POA: Insufficient documentation

## 2020-03-19 LAB — RESPIRATORY PANEL BY RT PCR (FLU A&B, COVID)
Influenza A by PCR: NEGATIVE
Influenza B by PCR: NEGATIVE
SARS Coronavirus 2 by RT PCR: NEGATIVE

## 2020-03-19 MED ORDER — PREDNISONE 50 MG PO TABS
60.0000 mg | ORAL_TABLET | Freq: Once | ORAL | Status: AC
  Administered 2020-03-19: 60 mg via ORAL
  Filled 2020-03-19: qty 1

## 2020-03-19 MED ORDER — PREDNISONE 50 MG PO TABS: 50.0000 mg | ORAL_TABLET | Freq: Every day | ORAL | 0 refills | Status: AC

## 2020-03-19 MED ORDER — DOXYCYCLINE HYCLATE 100 MG PO CAPS: 100.0000 mg | ORAL_CAPSULE | Freq: Two times a day (BID) | ORAL | 0 refills | Status: AC

## 2020-03-19 MED ORDER — ALBUTEROL SULFATE HFA 108 (90 BASE) MCG/ACT IN AERS
2.0000 | INHALATION_SPRAY | Freq: Once | RESPIRATORY_TRACT | Status: AC
  Administered 2020-03-19: 2 via RESPIRATORY_TRACT
  Filled 2020-03-19: qty 6.7
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
## 2020-03-19 NOTE — Discharge Instructions (Addendum)
Your xray showed findings concern for a pneumonia. Please take antibiotics as prescribed. I have also prescribed prednisone for your asthma exacerbation.   Follow up with your PCP in 1 week for recheck of your symptoms.   Return to the ED IMMEDIATELY for any worsening symptoms

## 2020-03-19 NOTE — Clinical Social Work Note (Signed)
Transition of Care [facility]) - Emergency Department Mini Assessment   Patient Details  Name: Tracy [name] MRN: [id number] Date of Birth: 1996/04/25  [facility]) CM/SW Contact:    Villa Herb, LCSWA Phone Number: [phone] 03/19/2020, 4:32 PM  Clinical Narrative: CSW visited pt in the ED due to pt having no PCP and no insurance. CSW explained [facility] to pt and provided pt with copy of [facility] info. CSW also gained permission to make a referral to the financial counselor Jerene Dilling) for possible resources. CSW made referrals to [facility] and the financial counselor. TOC signing off.   ED Mini Assessment: What brought you to the Emergency Department? : Asthma  Barriers to Discharge: ED PCP establishment, Inadequate or no insurance  Barrier interventions: Referral to [employer] and financial counselor  Means of departure: Car  Interventions which prevented an admission or readmission: Other (must enter comment) (Referrals to [employer] and Artist)  Patient Contact and Communications Key Contact 1: [facility] Key Contact 2: Financial counselor Jerene Dilling)   Contact Date: 03/19/20,      Choice offered to / list presented to : NA  Admission diagnosis:  ASTHMA-SOB Patient Active Problem List   Diagnosis Date Noted  . Post traumatic stress disorder (PTSD) 07/30/2018  . Acute blood loss anemia 07/26/2018  . Femur fracture (HCC) 07/24/2018  . Fracture, femur, shaft, open (HCC) 07/24/2018   PCP:  Patient, No Pcp Per Pharmacy:   [id number] - [place], [place] - [address] [place] [address] Phone: [phone] Fax: [phone]  [id number] [place], [place] - [address] AT [facility] OF [facility] [address] [place] [address] Phone: [phone] Fax: [phone]

## 2020-03-19 NOTE — ED Triage Notes (Signed)
Pt with sob since waking up this morning, ran out of his inhaler last night.

## 2020-04-01 DIAGNOSIS — U071 COVID-19: Secondary | ICD-10-CM | POA: Insufficient documentation

## 2020-04-01 HISTORY — DX: COVID-19: U07.1

## 2020-04-04 ENCOUNTER — Other Ambulatory Visit: Payer: Self-pay

## 2020-04-04 ENCOUNTER — Emergency Department (HOSPITAL_COMMUNITY): Payer: HRSA Program

## 2020-04-04 ENCOUNTER — Encounter (HOSPITAL_COMMUNITY): Payer: Self-pay | Admitting: Emergency Medicine

## 2020-04-04 ENCOUNTER — Emergency Department (HOSPITAL_COMMUNITY)
Admission: EM | Admit: 2020-04-04 | Discharge: 2020-04-04 | Disposition: A | Discharge status: Home / Self Care | Payer: HRSA Program | Attending: Emergency Medicine | Admitting: Emergency Medicine

## 2020-04-04 DIAGNOSIS — R062 Wheezing: Secondary | ICD-10-CM | POA: Diagnosis present

## 2020-04-04 DIAGNOSIS — Z87891 Personal history of nicotine dependence: Secondary | ICD-10-CM | POA: Insufficient documentation

## 2020-04-04 DIAGNOSIS — J45909 Unspecified asthma, uncomplicated: Secondary | ICD-10-CM | POA: Diagnosis not present

## 2020-04-04 DIAGNOSIS — U071 COVID-19: Secondary | ICD-10-CM | POA: Insufficient documentation

## 2020-04-04 DIAGNOSIS — Z79899 Other long term (current) drug therapy: Secondary | ICD-10-CM | POA: Insufficient documentation

## 2020-04-04 LAB — RESPIRATORY PANEL BY RT PCR (FLU A&B, COVID)
Influenza A by PCR: NEGATIVE
Influenza B by PCR: NEGATIVE
SARS Coronavirus 2 by RT PCR: POSITIVE — AB

## 2020-04-04 MED ORDER — ALBUTEROL SULFATE HFA 108 (90 BASE) MCG/ACT IN AERS
INHALATION_SPRAY | RESPIRATORY_TRACT | Status: AC
  Filled 2020-04-04: qty 6.7
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
## 2020-04-04 NOTE — ED Notes (Addendum)
Date and time results received: 04/04/20 1018   Test: Covid Critical Value: Positive   Name of Provider Notified: DR Effie Shy  Orders Received? Or Actions Taken?:NA

## 2020-04-04 NOTE — Discharge Instructions (Addendum)
Avoid contact with other people, until your symptoms have resolved.  This probably will take 2 weeks.  Use Tylenol every 4 hours for fever, and Robitussin-DM for cough.  You might benefit from receiving monoclonal antibody treatment for COVID-19 infection.  I sent a message to them to contact you.  You can also call them at the following number to arrange treatment. [phone]

## 2020-04-04 NOTE — ED Triage Notes (Signed)
Pt reports SHOB and wheezing that started 30 minutes ago. Denise fevers.

## 2020-04-05 ENCOUNTER — Telehealth: Payer: Self-pay | Admitting: Nurse Practitioner

## 2020-04-05 ENCOUNTER — Encounter: Payer: Self-pay | Admitting: Nurse Practitioner

## 2020-04-05 DIAGNOSIS — E663 Overweight: Secondary | ICD-10-CM | POA: Insufficient documentation

## 2020-04-05 DIAGNOSIS — U071 COVID-19: Secondary | ICD-10-CM

## 2020-04-05 DIAGNOSIS — J4520 Mild intermittent asthma, uncomplicated: Secondary | ICD-10-CM

## 2020-04-05 DIAGNOSIS — J45909 Unspecified asthma, uncomplicated: Secondary | ICD-10-CM | POA: Insufficient documentation

## 2020-04-05 NOTE — Telephone Encounter (Signed)
I called Tracy [name] to discuss Covid symptoms and the use of Sotrovimab, a monoclonal antibody infusion for those with mild to moderate Covid symptoms and at a high risk of hospitalization.     Pt is qualified for this infusion at the monoclonal antibody infusion center due to co-morbid conditions and/or a member of an at-risk group, however would like to think more about the infusion at this time. Symptoms tier reviewed as well as criteria for ending isolation.  Symptoms reviewed that would warrant ED/Hospital evaluation. Preventative practices reviewed. Patient verbalized understanding. Patient advised to call back if he decides that he does want to get infusion. Callback number to the infusion center given. Patient advised to go to Urgent care or ED with severe symptoms. Last date pt would be eligible for infusion is 11/13.    Patient Active Problem List   Diagnosis Date Noted   Asthma    Overweight (BMI 25.0-29.9)    COVID-19 virus infection 04/2020   Post traumatic stress disorder (PTSD) 07/30/2018   Acute blood loss anemia 07/26/2018   Femur fracture (HCC) 07/24/2018   Fracture, femur, shaft, open (HCC) 07/24/2018    Nicolasa Ducking, NP

## 2020-04-19 ENCOUNTER — Encounter (HOSPITAL_COMMUNITY): Payer: Self-pay | Admitting: Emergency Medicine

## 2020-04-19 ENCOUNTER — Emergency Department (HOSPITAL_COMMUNITY): Payer: Medicaid Other

## 2020-04-19 ENCOUNTER — Other Ambulatory Visit: Payer: Self-pay

## 2020-04-19 ENCOUNTER — Emergency Department (HOSPITAL_COMMUNITY)
Admission: EM | Admit: 2020-04-19 | Discharge: 2020-04-19 | Disposition: A | Discharge status: Home / Self Care | Payer: Medicaid Other | Attending: Emergency Medicine | Admitting: Emergency Medicine

## 2020-04-19 DIAGNOSIS — J4540 Moderate persistent asthma, uncomplicated: Secondary | ICD-10-CM | POA: Insufficient documentation

## 2020-04-19 DIAGNOSIS — I10 Essential (primary) hypertension: Secondary | ICD-10-CM | POA: Insufficient documentation

## 2020-04-19 DIAGNOSIS — J4521 Mild intermittent asthma with (acute) exacerbation: Secondary | ICD-10-CM

## 2020-04-19 DIAGNOSIS — Z87891 Personal history of nicotine dependence: Secondary | ICD-10-CM | POA: Insufficient documentation

## 2020-04-19 DIAGNOSIS — Z79899 Other long term (current) drug therapy: Secondary | ICD-10-CM | POA: Insufficient documentation

## 2020-04-19 DIAGNOSIS — Z7951 Long term (current) use of inhaled steroids: Secondary | ICD-10-CM | POA: Insufficient documentation

## 2020-04-19 DIAGNOSIS — Z8616 Personal history of COVID-19: Secondary | ICD-10-CM | POA: Insufficient documentation

## 2020-04-19 MED ORDER — PREDNISONE 50 MG PO TABS
60.0000 mg | ORAL_TABLET | Freq: Once | ORAL | Status: AC
  Administered 2020-04-19: 60 mg via ORAL
  Filled 2020-04-19: qty 1

## 2020-04-19 MED ORDER — ALBUTEROL SULFATE HFA 108 (90 BASE) MCG/ACT IN AERS
2.0000 | INHALATION_SPRAY | Freq: Once | RESPIRATORY_TRACT | Status: AC
  Administered 2020-04-19: 2 via RESPIRATORY_TRACT
  Filled 2020-04-19: qty 6.7

## 2020-04-19 MED ORDER — AEROCHAMBER PLUS FLO-VU MEDIUM MISC: 1.0000 | Freq: Once | Status: DC

## 2020-04-19 MED ORDER — IPRATROPIUM BROMIDE HFA 17 MCG/ACT IN AERS: 2.0000 | INHALATION_SPRAY | Freq: Once | RESPIRATORY_TRACT | Status: DC

## 2020-04-19 MED ORDER — PREDNISONE 50 MG PO TABS: 50.0000 mg | ORAL_TABLET | Freq: Every day | ORAL | 0 refills | Status: AC

## 2020-04-19 NOTE — Discharge Instructions (Signed)
You were evaluated in the emergency department today for your shortness of breath, wheezing, cough. Your vital signs, chest x-ray, EKG were very reassuring.  Physical exam was significant for wheezing throughout your lungs.  You were administered your first dose of prednisone in the emergency department as well as an albuterol inhaler.  I prescribed you an additional 5 days of prednisone outpatient to help with this asthma exacerbation.  Please pick up this prescription and take it for all 5 days as prescribed.  Please keep your appointment for the health department 11/29 as discussed.  Below is additionally the information for [place] and wellness clinic, free clinic associated with [place] that can help you get your medications at a more affordable price.  You may reach out to them to schedule an appointment.  I suspect that you would benefit from daily asthma medication that can prevent you from having such frequent asthma exacerbations.  Return to the emergency department develop any new chest pain, shortness of breath, dizziness, lightheadedness, if you pass out, if you develop any other new severe symptoms.

## 2020-04-19 NOTE — ED Triage Notes (Signed)
Pt reports SHOB since running out of his inhaler 2 days ago. Pt Dx with COVID 04/04/2020.
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
## 2020-04-19 NOTE — ED Notes (Signed)
Entered room and introduced self to patient. Patient appears asleep in chair at this time with no signs of acute distress noted. All questions and concerns voiced addressed at this time. Educated on call light use and hourly rounding, in agreement at this time. Respirations are even and unlabored with equal chest rise and fall.

## 2020-05-03 ENCOUNTER — Ambulatory Visit
Admission: EM | Admit: 2020-05-03 | Discharge: 2020-05-03 | Disposition: A | Discharge status: Home / Self Care | Payer: Medicaid Other | Attending: Emergency Medicine | Admitting: Emergency Medicine

## 2020-05-03 ENCOUNTER — Other Ambulatory Visit: Payer: Self-pay

## 2020-05-03 DIAGNOSIS — Z202 Contact with and (suspected) exposure to infections with a predominantly sexual mode of transmission: Secondary | ICD-10-CM | POA: Insufficient documentation

## 2020-05-03 DIAGNOSIS — R300 Dysuria: Secondary | ICD-10-CM | POA: Insufficient documentation

## 2020-05-03 LAB — POCT URINALYSIS DIP (MANUAL ENTRY)
Bilirubin, UA: NEGATIVE
Glucose, UA: NEGATIVE mg/dL
Leukocytes, UA: NEGATIVE
Nitrite, UA: NEGATIVE
Protein Ur, POC: NEGATIVE mg/dL
Spec Grav, UA: >=1.03 — AB (ref 1.010–1.025)
Urobilinogen, UA: 1 E.U./dL
pH, UA: 6.5 (ref 5.0–8.0)

## 2020-05-03 MED ORDER — CEFTRIAXONE SODIUM 500 MG IJ SOLR
500.0000 mg | Freq: Once | INTRAMUSCULAR | Status: AC
  Administered 2020-05-03: 500 mg via INTRAMUSCULAR

## 2020-05-03 MED ORDER — AZITHROMYCIN 1 G PO PACK
1.0000 g | PACK | Freq: Once | ORAL | Status: AC
  Administered 2020-05-03: 1 g via ORAL
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
## 2020-05-03 NOTE — Discharge Instructions (Signed)
  Given rocephin 500 mg injection and azithromycin 1g in office Penile self swab was obtained Practice safe sex Take medications as prescribed and to completion We will follow up with you regarding the results of your test If tests are positive, please abstain from sexual activity until you and your partner(s) are treated Follow up with PCP or [facility] if symptoms persists Return here or go to ER if you have any new or worsening symptoms

## 2020-05-05 LAB — URINE CULTURE: Culture: NO GROWTH

## 2020-05-06 LAB — CYTOLOGY, (ORAL, ANAL, URETHRAL) ANCILLARY ONLY
Chlamydia: NEGATIVE
Comment: NEGATIVE
Comment: NEGATIVE
Comment: NORMAL
Neisseria Gonorrhea: NEGATIVE
Trichomonas: NEGATIVE

## 2020-06-01 ENCOUNTER — Emergency Department (HOSPITAL_COMMUNITY)
Admission: EM | Admit: 2020-06-01 | Discharge: 2020-06-01 | Disposition: A | Discharge status: Home / Self Care | Payer: Medicaid Other | Attending: Emergency Medicine | Admitting: Emergency Medicine

## 2020-06-01 ENCOUNTER — Encounter (HOSPITAL_COMMUNITY): Payer: Self-pay | Admitting: Emergency Medicine

## 2020-06-01 ENCOUNTER — Other Ambulatory Visit: Payer: Self-pay

## 2020-06-01 DIAGNOSIS — J45909 Unspecified asthma, uncomplicated: Secondary | ICD-10-CM | POA: Insufficient documentation

## 2020-06-01 DIAGNOSIS — R0602 Shortness of breath: Secondary | ICD-10-CM | POA: Insufficient documentation

## 2020-06-01 DIAGNOSIS — Z5321 Procedure and treatment not carried out due to patient leaving prior to being seen by health care provider: Secondary | ICD-10-CM | POA: Insufficient documentation

## 2020-06-01 NOTE — ED Triage Notes (Signed)
Pt has a history of asthma and was covid positive a month ago.  Pt c/o shortness of breath that began at 0500 this morning.

## 2020-07-11 ENCOUNTER — Other Ambulatory Visit: Payer: Self-pay

## 2020-07-11 ENCOUNTER — Ambulatory Visit
Admission: EM | Admit: 2020-07-11 | Discharge: 2020-07-11 | Disposition: A | Discharge status: Home / Self Care | Payer: Medicaid Other | Attending: Family Medicine | Admitting: Family Medicine

## 2020-07-11 DIAGNOSIS — Z113 Encounter for screening for infections with a predominantly sexual mode of transmission: Secondary | ICD-10-CM | POA: Insufficient documentation

## 2020-07-11 DIAGNOSIS — R300 Dysuria: Secondary | ICD-10-CM | POA: Insufficient documentation

## 2020-07-11 LAB — POCT URINALYSIS DIP (MANUAL ENTRY)
Bilirubin, UA: NEGATIVE
Glucose, UA: NEGATIVE mg/dL
Ketones, POC UA: NEGATIVE mg/dL
Nitrite, UA: NEGATIVE
Protein Ur, POC: NEGATIVE mg/dL
Spec Grav, UA: 1.025 (ref 1.010–1.025)
Urobilinogen, UA: 1 E.U./dL
pH, UA: 6.5 (ref 5.0–8.0)
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
## 2020-07-11 NOTE — Discharge Instructions (Addendum)
You may have a urinary tract infection.   We are going to culture your urine and will call you as soon as we have the results.   Drink plenty of water, 8-10 glasses per day.   You may take AZO over the counter for painful urination.  Swab testing will be back in about 2-3 days.  Follow up with your primary care provider as needed.   Go to the Emergency Department if you experience severe pain, shortness of breath, high fever, or other concerns.   Follow up with urology if symptoms are not improving

## 2020-07-11 NOTE — ED Triage Notes (Signed)
Pt presents with c/o unusual sensation in groin area and at times has dysuria, pt tested for STDs and was negative

## 2020-07-12 ENCOUNTER — Telehealth (HOSPITAL_COMMUNITY): Payer: Self-pay | Admitting: Emergency Medicine

## 2020-07-12 LAB — CYTOLOGY, (ORAL, ANAL, URETHRAL) ANCILLARY ONLY
Chlamydia: POSITIVE — AB
Comment: NEGATIVE
Comment: NEGATIVE
Comment: NORMAL
Neisseria Gonorrhea: NEGATIVE
Trichomonas: NEGATIVE

## 2020-07-12 MED ORDER — DOXYCYCLINE HYCLATE 100 MG PO CAPS: 100.0000 mg | ORAL_CAPSULE | Freq: Two times a day (BID) | ORAL | 0 refills | Status: AC

## 2020-07-13 LAB — URINE CULTURE: Culture: NO GROWTH

## 2020-08-26 ENCOUNTER — Other Ambulatory Visit: Payer: Self-pay

## 2020-08-26 ENCOUNTER — Encounter (HOSPITAL_COMMUNITY): Payer: Self-pay | Admitting: *Deleted

## 2020-08-26 ENCOUNTER — Emergency Department (HOSPITAL_COMMUNITY)
Admission: EM | Admit: 2020-08-26 | Discharge: 2020-08-26 | Disposition: A | Discharge status: Home / Self Care | Payer: Medicaid Other | Attending: Emergency Medicine | Admitting: Emergency Medicine

## 2020-08-26 DIAGNOSIS — Z7951 Long term (current) use of inhaled steroids: Secondary | ICD-10-CM | POA: Insufficient documentation

## 2020-08-26 DIAGNOSIS — Z8616 Personal history of COVID-19: Secondary | ICD-10-CM | POA: Insufficient documentation

## 2020-08-26 DIAGNOSIS — J4541 Moderate persistent asthma with (acute) exacerbation: Secondary | ICD-10-CM

## 2020-08-26 DIAGNOSIS — J069 Acute upper respiratory infection, unspecified: Secondary | ICD-10-CM | POA: Insufficient documentation

## 2020-08-26 DIAGNOSIS — Z87891 Personal history of nicotine dependence: Secondary | ICD-10-CM | POA: Insufficient documentation

## 2020-08-26 DIAGNOSIS — Z20822 Contact with and (suspected) exposure to covid-19: Secondary | ICD-10-CM | POA: Insufficient documentation

## 2020-08-26 MED ORDER — PREDNISONE 20 MG PO TABS
40.0000 mg | ORAL_TABLET | Freq: Once | ORAL | Status: AC
  Administered 2020-08-26: 40 mg via ORAL
  Filled 2020-08-26: qty 2

## 2020-08-26 MED ORDER — BENZONATATE 100 MG PO CAPS: 100.0000 mg | ORAL_CAPSULE | Freq: Three times a day (TID) | ORAL | 0 refills | Status: DC

## 2020-08-26 MED ORDER — PREDNISONE 20 MG PO TABS: 40.0000 mg | ORAL_TABLET | Freq: Every day | ORAL | 0 refills | Status: DC

## 2020-08-26 MED ORDER — AEROCHAMBER Z-STAT PLUS/MEDIUM MISC
1.0000 | Freq: Once | Status: AC
  Administered 2020-08-26: 1

## 2020-08-26 NOTE — Discharge Instructions (Addendum)
Please quarantine until you get the results of your Covid test back which should be back within 24 hours  Prednisone daily for 5 days  Albuterol inhaler 2 puffs every 4 hours using the spacer as we have showed you  Please see the health department to get the rest your medications refilled.

## 2020-08-26 NOTE — ED Triage Notes (Signed)
Pt with cough and wheezing for 3-4 days, worse last night.  Pt also c/o HA.  Recent covid exposure as well. Denies any fever.  Denies any covid vaccines.
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
## 2020-08-27 LAB — SARS CORONAVIRUS 2 (TAT 6-24 HRS): SARS Coronavirus 2: NEGATIVE

## 2020-09-10 ENCOUNTER — Encounter (HOSPITAL_COMMUNITY): Payer: Self-pay | Admitting: Emergency Medicine

## 2020-09-10 ENCOUNTER — Other Ambulatory Visit: Payer: Self-pay

## 2020-09-10 ENCOUNTER — Emergency Department (HOSPITAL_COMMUNITY): Payer: Self-pay

## 2020-09-10 ENCOUNTER — Emergency Department (HOSPITAL_COMMUNITY)
Admission: EM | Admit: 2020-09-10 | Discharge: 2020-09-10 | Disposition: A | Discharge status: Home / Self Care | Payer: Self-pay | Attending: Emergency Medicine | Admitting: Emergency Medicine

## 2020-09-10 DIAGNOSIS — Z8616 Personal history of COVID-19: Secondary | ICD-10-CM | POA: Insufficient documentation

## 2020-09-10 DIAGNOSIS — J45909 Unspecified asthma, uncomplicated: Secondary | ICD-10-CM | POA: Insufficient documentation

## 2020-09-10 DIAGNOSIS — R509 Fever, unspecified: Secondary | ICD-10-CM

## 2020-09-10 DIAGNOSIS — Z87891 Personal history of nicotine dependence: Secondary | ICD-10-CM | POA: Insufficient documentation

## 2020-09-10 DIAGNOSIS — B349 Viral infection, unspecified: Secondary | ICD-10-CM | POA: Insufficient documentation

## 2020-09-10 DIAGNOSIS — Z20822 Contact with and (suspected) exposure to covid-19: Secondary | ICD-10-CM | POA: Insufficient documentation

## 2020-09-10 DIAGNOSIS — Z28310 Unvaccinated for covid-19: Secondary | ICD-10-CM | POA: Insufficient documentation

## 2020-09-10 DIAGNOSIS — R112 Nausea with vomiting, unspecified: Secondary | ICD-10-CM

## 2020-09-10 DIAGNOSIS — Z7951 Long term (current) use of inhaled steroids: Secondary | ICD-10-CM | POA: Insufficient documentation

## 2020-09-10 LAB — RESP PANEL BY RT-PCR (FLU A&B, COVID) ARPGX2
Influenza A by PCR: NEGATIVE
Influenza B by PCR: NEGATIVE
SARS Coronavirus 2 by RT PCR: NEGATIVE

## 2020-09-10 MED ORDER — BENZONATATE 200 MG PO CAPS: 200.0000 mg | ORAL_CAPSULE | Freq: Three times a day (TID) | ORAL | 0 refills | Status: DC | PRN

## 2020-09-10 MED ORDER — IBUPROFEN 400 MG PO TABS
600.0000 mg | ORAL_TABLET | Freq: Once | ORAL | Status: AC
  Administered 2020-09-10: 600 mg via ORAL
  Filled 2020-09-10: qty 2

## 2020-09-10 MED ORDER — ONDANSETRON 4 MG PO TBDP: ORAL_TABLET | ORAL | 0 refills | Status: DC

## 2020-09-10 MED ORDER — ACETAMINOPHEN 500 MG PO TABS
1000.0000 mg | ORAL_TABLET | Freq: Once | ORAL | Status: AC
  Administered 2020-09-10: 1000 mg via ORAL
  Filled 2020-09-10: qty 2

## 2020-09-10 MED ORDER — ONDANSETRON 4 MG PO TBDP
4.0000 mg | ORAL_TABLET | Freq: Once | ORAL | Status: AC | PRN
  Administered 2020-09-10: 4 mg via ORAL
  Filled 2020-09-10: qty 1

## 2020-09-10 MED ORDER — BENZONATATE 100 MG PO CAPS
200.0000 mg | ORAL_CAPSULE | Freq: Once | ORAL | Status: AC
  Administered 2020-09-10: 200 mg via ORAL
  Filled 2020-09-10: qty 2

## 2020-09-10 NOTE — ED Triage Notes (Addendum)
Headache, body aches,nausea,  vomiting, cough,  shortness of breath that started yesterday

## 2020-09-10 NOTE — ED Triage Notes (Signed)
Emergency Medicine Provider Triage Evaluation Note  Tracy [name] , a 25 y.o. male  was evaluated in triage.  Pt complains of fever, cough, headache, nasal congestion, myalgias and vomiting.  Symptoms started yesterday, were initially mild, primarily nasal congestion and sore throat, but feeling much worse today.  No known sick contacts, but works in [employer]nel [employer] and is around many people daily.  Unvaccinated for Covid  Review of Systems  Positive: Fever, cough, nasal congestion, body aches, headache, vomiting Negative: Abdominal pain  Physical Exam  BP (!) 145/94 (BP Location: Right Arm)   Pulse 97   Temp (!) 101.7 F (38.7 C) (Oral)   Resp 19   SpO2 100%  Gen:   Awake, no distress  HEENT:  Atraumatic  Resp:  Normal effort  Cardiac:  Normal rate  Abd:   Nondistended MSK:   Moves extremities without difficulty  Neuro:  Speech clear  Medical Decision Making  Medically screening exam initiated at 7:07 PM.  Appropriate orders placed.  [name] was informed that the remainder of the evaluation will be completed by another provider, this initial triage assessment does not replace that evaluation, and the importance of remaining in the ED until their evaluation is complete.  Clinical Impression  1.  Fever    Dartha Lodge, [place] 09/10/20 1911
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
## 2020-09-10 NOTE — Discharge Instructions (Addendum)
Your chest x-ray is clear today.  Covid and flu testing were negative suspect your symptoms are due to another viral syndrome.  Treat her symptoms supportively with ibuprofen 600 mg and Tylenol 1000 mg every 6 hours, use prescribed cough medication as needed and Zofran as needed for nausea and vomiting.  Make sure you are drinking plenty of fluids.  Return for new or worsening symptoms.  Follow-up with your primary care provider.  If you continue to have fevers and symptoms or not improving you should follow-up for repeat Covid testing and further evaluation.

## 2020-10-21 DIAGNOSIS — Z113 Encounter for screening for infections with a predominantly sexual mode of transmission: Secondary | ICD-10-CM | POA: Diagnosis not present

## 2020-11-08 IMAGING — DX DG CHEST 1V PORT
1 series · 1 of 1 positions shown · non-contrast
Comparison: 02/11/2020

CLINICAL DATA: Dyspnea, asthma

EXAM:
PORTABLE CHEST 1 VIEW

[chest ap]
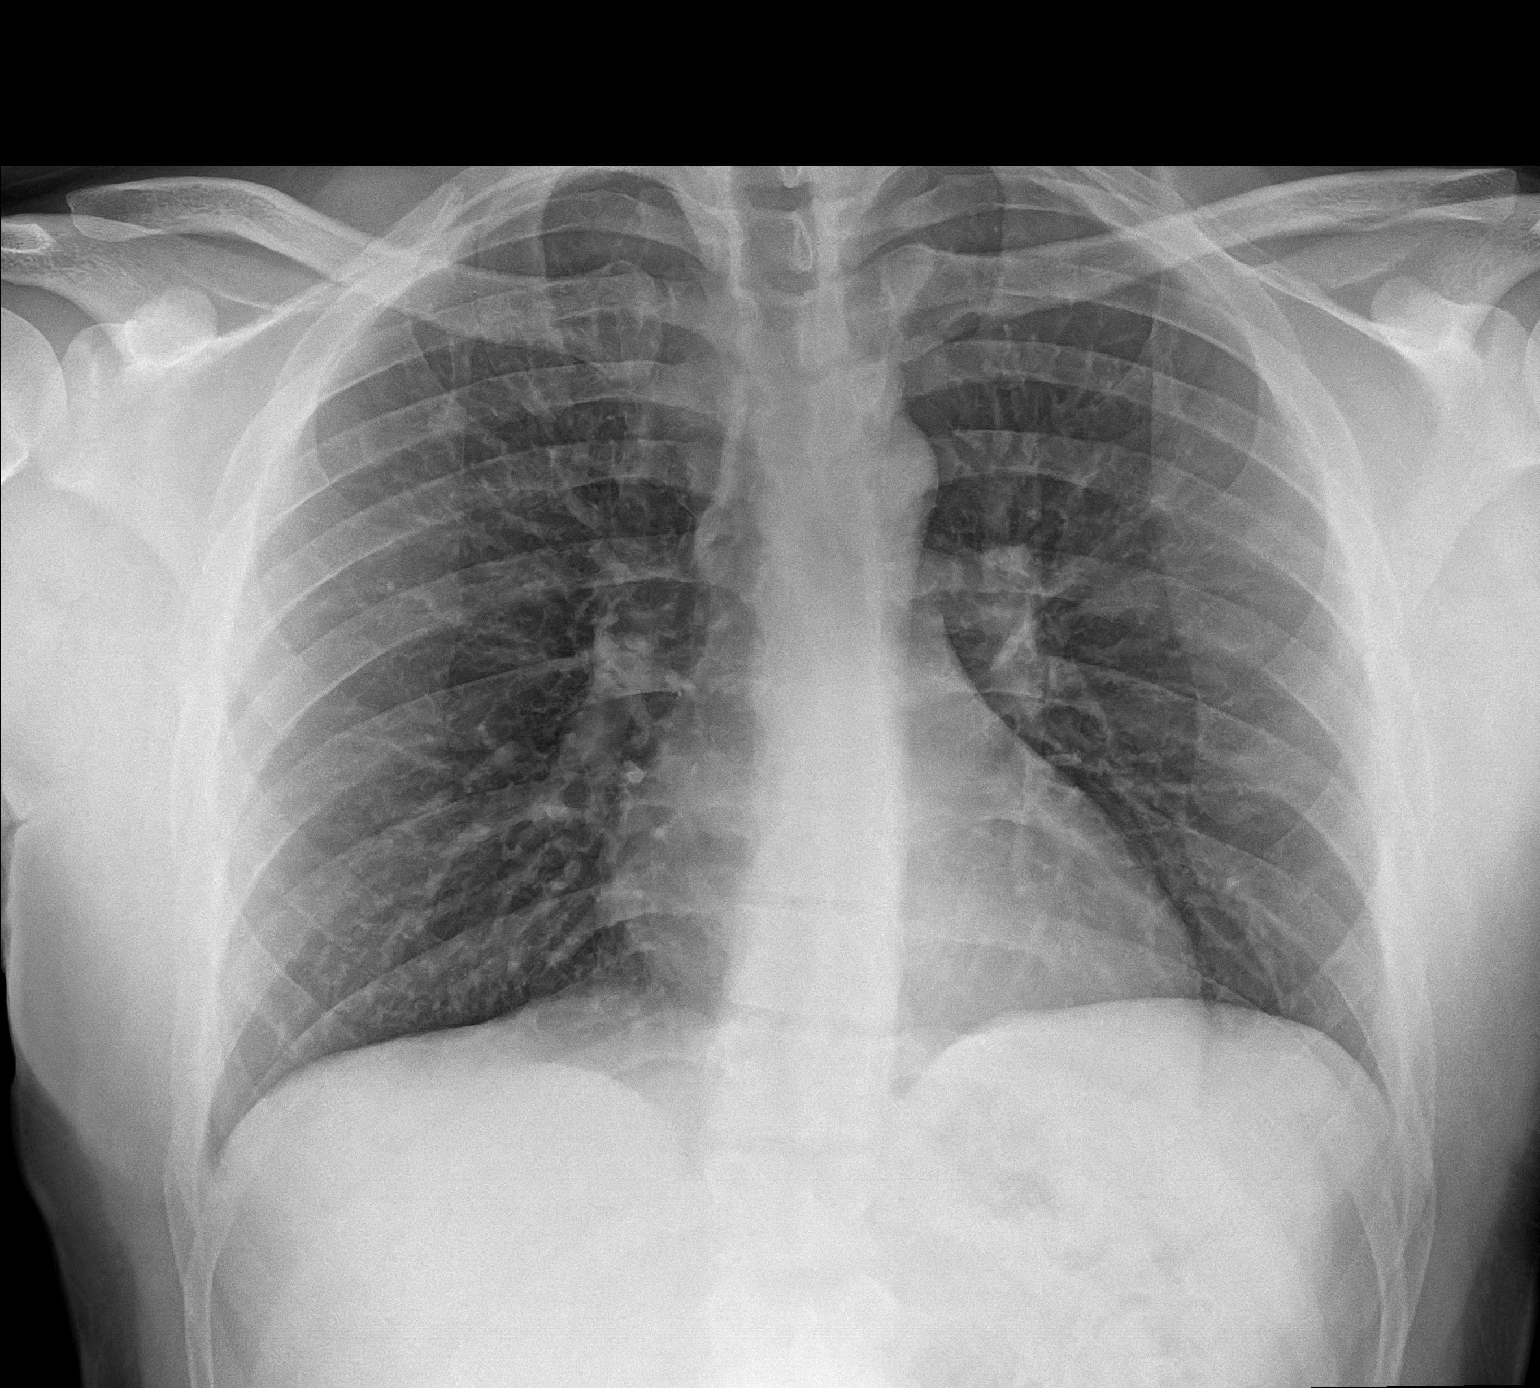

[1 of 1 positions shown; findings below may reference images not displayed]

FINDINGS: The lungs are well expanded and are symmetric. Pulmonary
insufflation is stable when compared to prior examination. There has
developed interstitial thickening diffusely throughout the lungs as
well as sparse focal pulmonary infiltrate within the left mid lung
zone suspicious for changes of multifocal bronchopneumonia in the
appropriate clinical setting. No pneumothorax or pleural effusion.
Cardiac size within normal limits. No acute bone abnormality.
IMPRESSION: Interstitial thickening and sparse focal pulmonary infiltrate
suspicious for changes of multifocal bronchopneumonia in the
appropriate clinical setting.

## 2021-04-09 ENCOUNTER — Other Ambulatory Visit: Payer: Self-pay

## 2021-04-09 ENCOUNTER — Encounter (HOSPITAL_COMMUNITY): Payer: Self-pay | Admitting: Emergency Medicine

## 2021-04-09 ENCOUNTER — Emergency Department (HOSPITAL_COMMUNITY)
Admission: EM | Admit: 2021-04-09 | Discharge: 2021-04-10 | Disposition: A | Discharge status: Home / Self Care | Payer: Medicaid Other | Attending: Emergency Medicine | Admitting: Emergency Medicine

## 2021-04-09 DIAGNOSIS — Z202 Contact with and (suspected) exposure to infections with a predominantly sexual mode of transmission: Secondary | ICD-10-CM | POA: Insufficient documentation

## 2021-04-09 DIAGNOSIS — J45909 Unspecified asthma, uncomplicated: Secondary | ICD-10-CM | POA: Insufficient documentation

## 2021-04-09 DIAGNOSIS — Z87891 Personal history of nicotine dependence: Secondary | ICD-10-CM | POA: Insufficient documentation

## 2021-04-09 DIAGNOSIS — Z8616 Personal history of COVID-19: Secondary | ICD-10-CM | POA: Insufficient documentation

## 2021-04-09 LAB — URINALYSIS, ROUTINE W REFLEX MICROSCOPIC
Bacteria, UA: NONE SEEN
Bilirubin Urine: NEGATIVE
Glucose, UA: NEGATIVE mg/dL
Ketones, ur: NEGATIVE mg/dL
Leukocytes,Ua: NEGATIVE
Nitrite: NEGATIVE
Protein, ur: NEGATIVE mg/dL
Specific Gravity, Urine: 1.014 (ref 1.005–1.030)
pH: 5 (ref 5.0–8.0)

## 2021-04-09 NOTE — ED Notes (Signed)
No response x2 ?
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
## 2021-04-10 LAB — GC/CHLAMYDIA PROBE AMP (~~LOC~~) NOT AT ARMC
Chlamydia: POSITIVE — AB
Comment: NEGATIVE
Comment: NORMAL
Neisseria Gonorrhea: NEGATIVE

## 2021-04-10 MED ORDER — LIDOCAINE HCL (PF) 1 % IJ SOLN
INTRAMUSCULAR | Status: AC
  Filled 2021-04-10: qty 5

## 2021-04-10 MED ORDER — CEFTRIAXONE SODIUM 500 MG IJ SOLR
500.0000 mg | Freq: Once | INTRAMUSCULAR | Status: AC
  Administered 2021-04-10: 500 mg via INTRAMUSCULAR
  Filled 2021-04-10: qty 500

## 2021-04-10 MED ORDER — DOXYCYCLINE HYCLATE 100 MG PO CAPS: 100.0000 mg | ORAL_CAPSULE | Freq: Two times a day (BID) | ORAL | 0 refills | Status: DC

## 2021-04-10 MED ORDER — DOXYCYCLINE HYCLATE 100 MG PO TABS
100.0000 mg | ORAL_TABLET | Freq: Once | ORAL | Status: AC
  Administered 2021-04-10: 100 mg via ORAL
  Filled 2021-04-10: qty 1
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
## 2021-04-29 ENCOUNTER — Encounter (HOSPITAL_COMMUNITY): Payer: Self-pay | Admitting: *Deleted

## 2021-04-29 ENCOUNTER — Emergency Department (HOSPITAL_COMMUNITY)
Admission: EM | Admit: 2021-04-29 | Discharge: 2021-04-29 | Disposition: A | Discharge status: Home / Self Care | Payer: Medicaid Other | Attending: Emergency Medicine | Admitting: Emergency Medicine

## 2021-04-29 DIAGNOSIS — Z87891 Personal history of nicotine dependence: Secondary | ICD-10-CM | POA: Insufficient documentation

## 2021-04-29 DIAGNOSIS — Z8616 Personal history of COVID-19: Secondary | ICD-10-CM | POA: Insufficient documentation

## 2021-04-29 DIAGNOSIS — J45909 Unspecified asthma, uncomplicated: Secondary | ICD-10-CM | POA: Insufficient documentation

## 2021-04-29 DIAGNOSIS — W228XXA Striking against or struck by other objects, initial encounter: Secondary | ICD-10-CM | POA: Insufficient documentation

## 2021-04-29 DIAGNOSIS — S0502XA Injury of conjunctiva and corneal abrasion without foreign body, left eye, initial encounter: Secondary | ICD-10-CM | POA: Insufficient documentation

## 2021-04-29 DIAGNOSIS — Z7951 Long term (current) use of inhaled steroids: Secondary | ICD-10-CM | POA: Insufficient documentation

## 2021-04-29 MED ORDER — ERYTHROMYCIN 5 MG/GM OP OINT: TOPICAL_OINTMENT | Freq: Four times a day (QID) | OPHTHALMIC | 0 refills | Status: AC

## 2021-04-29 MED ORDER — FLUORESCEIN SODIUM 1 MG OP STRP
1.0000 | ORAL_STRIP | Freq: Once | OPHTHALMIC | Status: AC
  Administered 2021-04-29: 1 via OPHTHALMIC

## 2021-04-29 MED ORDER — TETRACAINE HCL 0.5 % OP SOLN
2.0000 [drp] | Freq: Once | OPHTHALMIC | Status: AC
  Administered 2021-04-29: 2 [drp] via OPHTHALMIC

## 2021-04-29 NOTE — Discharge Instructions (Addendum)
Please take the antibiotic eyedrops as prescribed.  Please call ophthalmology office to get an appointment as soon as possible.  If you have increasing pain, decrease in vision, come back to ER for reassessment.
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
## 2021-04-29 NOTE — ED Triage Notes (Signed)
Left eye pain for 4 days
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
## 2021-08-11 ENCOUNTER — Emergency Department (HOSPITAL_COMMUNITY)
Admission: EM | Admit: 2021-08-11 | Discharge: 2021-08-11 | Disposition: A | Discharge status: Home / Self Care | Payer: Medicaid Other | Attending: Emergency Medicine | Admitting: Emergency Medicine

## 2021-08-11 ENCOUNTER — Encounter (HOSPITAL_COMMUNITY): Payer: Self-pay | Admitting: *Deleted

## 2021-08-11 ENCOUNTER — Emergency Department (HOSPITAL_COMMUNITY): Payer: Medicaid Other

## 2021-08-11 DIAGNOSIS — W010XXA Fall on same level from slipping, tripping and stumbling without subsequent striking against object, initial encounter: Secondary | ICD-10-CM | POA: Insufficient documentation

## 2021-08-11 DIAGNOSIS — M25561 Pain in right knee: Secondary | ICD-10-CM | POA: Insufficient documentation

## 2021-08-11 DIAGNOSIS — W19XXXA Unspecified fall, initial encounter: Secondary | ICD-10-CM

## 2021-08-11 DIAGNOSIS — M79644 Pain in right finger(s): Secondary | ICD-10-CM | POA: Insufficient documentation

## 2021-08-11 DIAGNOSIS — Z79899 Other long term (current) drug therapy: Secondary | ICD-10-CM | POA: Insufficient documentation

## 2021-08-11 NOTE — ED Notes (Signed)
Pt slipped and fell, landing on right knee. Denies hitting his head.  C/o right pinky finger  ?

## 2021-08-11 NOTE — ED Triage Notes (Signed)
Fell 2 days ago, pain in right knee and right pinky finger ?
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
## 2021-08-11 NOTE — Discharge Instructions (Signed)
You were seen in the emergency department after a fall. You fractured the end of your right pinky finger. Please keep the splint on for the next 2 weeks as your finger is healing. We also provided you with a knee brace to help with any discomfort over the next 1-2 weeks. Please use ibuprofen or tylenol as needed for pain symptoms. Please return if you are unable to walk.  ?

## 2021-08-28 ENCOUNTER — Emergency Department (HOSPITAL_COMMUNITY): Payer: Medicaid Other

## 2021-08-28 ENCOUNTER — Emergency Department (HOSPITAL_COMMUNITY)
Admission: EM | Admit: 2021-08-28 | Discharge: 2021-08-28 | Disposition: A | Discharge status: Home / Self Care | Payer: Medicaid Other | Attending: Emergency Medicine | Admitting: Emergency Medicine

## 2021-08-28 ENCOUNTER — Encounter (HOSPITAL_COMMUNITY): Payer: Self-pay | Admitting: Emergency Medicine

## 2021-08-28 DIAGNOSIS — J45909 Unspecified asthma, uncomplicated: Secondary | ICD-10-CM | POA: Insufficient documentation

## 2021-08-28 DIAGNOSIS — G479 Sleep disorder, unspecified: Secondary | ICD-10-CM | POA: Insufficient documentation

## 2021-08-28 DIAGNOSIS — Z8616 Personal history of COVID-19: Secondary | ICD-10-CM | POA: Insufficient documentation

## 2021-08-28 DIAGNOSIS — Z87891 Personal history of nicotine dependence: Secondary | ICD-10-CM | POA: Insufficient documentation

## 2021-08-28 DIAGNOSIS — R079 Chest pain, unspecified: Secondary | ICD-10-CM | POA: Insufficient documentation

## 2021-08-28 MED ORDER — ALUM & MAG HYDROXIDE-SIMETH 200-200-20 MG/5ML PO SUSP
30.0000 mL | Freq: Once | ORAL | Status: AC
  Administered 2021-08-28: 30 mL via ORAL
  Filled 2021-08-28: qty 30

## 2021-08-28 NOTE — Discharge Instructions (Signed)
You were evaluated in the Emergency Department and after careful evaluation, we did not find any emergent condition requiring admission or further testing in the hospital. ? ?Your exam/testing today was overall reassuring.  Normal EKG and chest x-ray.  Recommend follow-up with the primary care doctor to discuss your symptoms. ? ?Please return to the Emergency Department if you experience any worsening of your condition.  Thank you for allowing [place] to be a part of your care. ? ?
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
## 2021-09-12 DIAGNOSIS — Z1152 Encounter for screening for COVID-19: Secondary | ICD-10-CM | POA: Diagnosis not present

## 2021-09-15 ENCOUNTER — Emergency Department (HOSPITAL_COMMUNITY)
Admission: EM | Admit: 2021-09-15 | Discharge: 2021-09-15 | Disposition: A | Discharge status: Home / Self Care | Payer: Medicaid Other | Attending: Emergency Medicine | Admitting: Emergency Medicine

## 2021-09-15 ENCOUNTER — Other Ambulatory Visit: Payer: Self-pay

## 2021-09-15 ENCOUNTER — Encounter (HOSPITAL_COMMUNITY): Payer: Self-pay

## 2021-09-15 DIAGNOSIS — Y92410 Unspecified street and highway as the place of occurrence of the external cause: Secondary | ICD-10-CM | POA: Diagnosis not present

## 2021-09-15 DIAGNOSIS — S39012A Strain of muscle, fascia and tendon of lower back, initial encounter: Secondary | ICD-10-CM | POA: Diagnosis not present

## 2021-09-15 DIAGNOSIS — T148XXA Other injury of unspecified body region, initial encounter: Secondary | ICD-10-CM

## 2021-09-15 DIAGNOSIS — S3992XA Unspecified injury of lower back, initial encounter: Secondary | ICD-10-CM | POA: Diagnosis present

## 2021-09-15 MED ORDER — METHOCARBAMOL 500 MG PO TABS: 500.0000 mg | ORAL_TABLET | Freq: Three times a day (TID) | ORAL | 0 refills | Status: DC

## 2021-09-15 MED ORDER — IBUPROFEN 800 MG PO TABS: 800.0000 mg | ORAL_TABLET | Freq: Three times a day (TID) | ORAL | 0 refills | Status: DC
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
## 2021-09-15 NOTE — Discharge Instructions (Signed)
You have likely strained the muscles of your back.  I recommend that you apply ice packs on and off to help with symptoms.  After 2 to 3 days, you may alternate the ice with heat.  Take medication as directed.  Follow-up with your primary care provider for recheck or return to the emergency department for any new or worsening symptoms ?

## 2021-11-07 ENCOUNTER — Emergency Department (HOSPITAL_COMMUNITY)
Admission: EM | Admit: 2021-11-07 | Discharge: 2021-11-07 | Disposition: A | Discharge status: Home / Self Care | Payer: Self-pay | Attending: Emergency Medicine | Admitting: Emergency Medicine

## 2021-11-07 ENCOUNTER — Encounter (HOSPITAL_COMMUNITY): Payer: Self-pay | Admitting: Emergency Medicine

## 2021-11-07 ENCOUNTER — Other Ambulatory Visit: Payer: Self-pay

## 2021-11-07 DIAGNOSIS — L509 Urticaria, unspecified: Secondary | ICD-10-CM | POA: Insufficient documentation

## 2021-11-07 DIAGNOSIS — W57XXXA Bitten or stung by nonvenomous insect and other nonvenomous arthropods, initial encounter: Secondary | ICD-10-CM | POA: Insufficient documentation

## 2021-11-07 MED ORDER — FAMOTIDINE 20 MG PO TABS
20.0000 mg | ORAL_TABLET | Freq: Once | ORAL | Status: AC
Start: 2021-11-07 — End: 2021-11-07
  Administered 2021-11-07: 20 mg via ORAL
  Filled 2021-11-07: qty 1

## 2021-11-07 MED ORDER — METHYLPREDNISOLONE SODIUM SUCC 125 MG IJ SOLR: 125.0000 mg | Freq: Once | INTRAMUSCULAR | Status: AC

## 2021-11-07 MED ORDER — DIPHENHYDRAMINE HCL 25 MG PO CAPS
25.0000 mg | ORAL_CAPSULE | Freq: Once | ORAL | Status: AC
  Administered 2021-11-07: 25 mg via ORAL
  Filled 2021-11-07: qty 1

## 2021-11-07 MED ORDER — METHYLPREDNISOLONE SODIUM SUCC 125 MG IJ SOLR
INTRAMUSCULAR | Status: AC
  Administered 2021-11-07: 125 mg via INTRAMUSCULAR
  Filled 2021-11-07: qty 2

## 2021-11-07 NOTE — ED Triage Notes (Addendum)
Pt felt possible insect bite/sting to left knee approx 15 min pta. C/o swelling every where. Hives noted to left knee and right groin area. None noted to trunk area. No oral swelling noted.

## 2021-11-07 NOTE — Discharge Instructions (Signed)
Continue benadryl 25 mg every 4 hours  °

## 2021-11-12 NOTE — ED Provider Notes (Signed)
[facility] EMERGENCY DEPARTMENT Provider Note   CSN: [id number] Arrival date & time: 11/07/21  1512     History  Chief Complaint  Patient presents with   Urticaria    Tracy [name] is a 26 y.o. male.  Pt complains of a rash after being bite by something. Pt reports hive like area.   The history is provided by the patient. No language interpreter was used.  Urticaria This is a new problem. The problem occurs constantly. The problem has not changed since onset.Nothing aggravates the symptoms. Nothing relieves the symptoms. He has tried nothing for the symptoms.       Home Medications Prior to Admission medications   Medication Sig Start Date End Date Taking? Authorizing Provider  ADVAIR DISKUS 250-50 MCG/DOSE AEPB Inhale 1 puff into the lungs 2 (two) times daily. 06/04/20   [provider]  albuterol (VENTOLIN HFA) 108 (90 Base) MCG/ACT inhaler Inhale 1-2 puffs into the lungs every 6 (six) hours as needed for wheezing or shortness of breath. 02/11/20   Elson Areas, PA-C  amLODipine (NORVASC) 5 MG tablet Take 1 tablet (5 mg total) by mouth daily. Patient not taking: No sig reported 12/01/19   Dione Booze, MD  benzonatate (TESSALON) 200 MG capsule Take 1 capsule (200 mg total) by mouth 3 (three) times daily as needed for cough. 09/10/20   Dartha Lodge, PA-C  doxycycline (VIBRAMYCIN) 100 MG capsule Take 1 capsule (100 mg total) by mouth 2 (two) times daily. 04/10/21   Elpidio Anis, PA-C  Fluticasone Propionate, Inhal, 55 MCG/ACT AEPB Inhale 1 puff into the lungs in the morning and at bedtime. Patient not taking: No sig reported 12/01/19   Dione Booze, MD  ibuprofen (ADVIL) 800 MG tablet Take 1 tablet (800 mg total) by mouth 3 (three) times daily. Take with food 09/15/21   Triplett, Tammy, PA-C  methocarbamol (ROBAXIN) 500 MG tablet Take 1 tablet (500 mg total) by mouth 3 (three) times daily. 09/15/21   Triplett, Tammy, PA-C  montelukast (SINGULAIR) 10 MG tablet Take 1  tablet (10 mg total) by mouth daily. 12/01/19   Dione Booze, MD  ondansetron (ZOFRAN ODT) 4 MG disintegrating tablet 4mg  ODT q4 hours prn nausea/vomit 09/10/20   11/10/20, PA-C      Allergies    Penicillins    Review of Systems   Review of Systems  All other systems reviewed and are negative.   Physical Exam Updated Vital Signs BP (!) 149/89 (BP Location: Left Arm)   Pulse 65   Temp 98.1 F (36.7 C) (Oral)   Resp 16   SpO2 97%  Physical Exam Vitals and nursing note reviewed.  Constitutional:      General: He is not in acute distress.    Appearance: He is well-developed.  HENT:     Head: Normocephalic and atraumatic.     Nose: Nose normal.  Cardiovascular:     Rate and Rhythm: Normal rate.     Heart sounds: No murmur heard. Pulmonary:     Effort: Pulmonary effort is normal. No respiratory distress.  Musculoskeletal:        General: No swelling.  Skin:    General: Skin is warm.     Capillary Refill: Capillary refill takes less than 2 seconds.     Findings: Erythema and rash present.  Neurological:     Mental Status: He is alert.  Psychiatric:        Mood and Affect: Mood normal.  ED Results / Procedures / Treatments   Labs (all labs ordered are listed, but only abnormal results are displayed) Labs Reviewed - No data to display  EKG None  Radiology No results found.  Procedures Procedures    Medications Ordered in ED Medications  methylPREDNISolone sodium succinate (SOLU-MEDROL) 125 mg/2 mL injection 125 mg (125 mg Intramuscular Given 11/07/21 1535)  diphenhydrAMINE (BENADRYL) capsule 25 mg (25 mg Oral Given 11/07/21 1535)  famotidine (PEPCID) tablet 20 mg (20 mg Oral Given 11/07/21 1535)    ED Course/ Medical Decision Making/ A&P                           Medical Decision Making Risk Prescription drug management.  Pt reevaluated,  rash improved.  Pt feels much better          Final Clinical Impression(s) / ED Diagnoses Final  diagnoses:  Insect bite, unspecified site, initial encounter    Rx / DC Orders ED Discharge Orders     None      An After Visit Summary was printed and given to the patient.    Osie Cheeks 11/12/21 1212    Cheryll Cockayne, MD 11/17/21 1659

## 2021-11-17 ENCOUNTER — Emergency Department (HOSPITAL_COMMUNITY)
Admission: EM | Admit: 2021-11-17 | Discharge: 2021-11-17 | Disposition: A | Discharge status: Home / Self Care | Payer: Self-pay | Attending: Emergency Medicine | Admitting: Emergency Medicine

## 2021-11-17 ENCOUNTER — Encounter (HOSPITAL_COMMUNITY): Payer: Self-pay | Admitting: *Deleted

## 2021-11-17 ENCOUNTER — Other Ambulatory Visit: Payer: Self-pay

## 2021-11-17 ENCOUNTER — Emergency Department (HOSPITAL_COMMUNITY): Payer: Self-pay

## 2021-11-17 DIAGNOSIS — J4520 Mild intermittent asthma, uncomplicated: Secondary | ICD-10-CM

## 2021-11-17 DIAGNOSIS — J4521 Mild intermittent asthma with (acute) exacerbation: Secondary | ICD-10-CM | POA: Insufficient documentation

## 2021-11-17 DIAGNOSIS — H6693 Otitis media, unspecified, bilateral: Secondary | ICD-10-CM | POA: Insufficient documentation

## 2021-11-17 DIAGNOSIS — J029 Acute pharyngitis, unspecified: Secondary | ICD-10-CM

## 2021-11-17 DIAGNOSIS — Z20822 Contact with and (suspected) exposure to covid-19: Secondary | ICD-10-CM | POA: Insufficient documentation

## 2021-11-17 DIAGNOSIS — J3489 Other specified disorders of nose and nasal sinuses: Secondary | ICD-10-CM | POA: Insufficient documentation

## 2021-11-17 LAB — GROUP A STREP BY PCR: Group A Strep by PCR: NOT DETECTED

## 2021-11-17 LAB — SARS CORONAVIRUS 2 BY RT PCR: SARS Coronavirus 2 by RT PCR: NEGATIVE

## 2021-11-17 MED ORDER — MONTELUKAST SODIUM 10 MG PO TABS: 10.0000 mg | ORAL_TABLET | Freq: Every day | ORAL | 0 refills | Status: DC

## 2021-11-17 MED ORDER — LORATADINE 10 MG PO TABS: 10.0000 mg | ORAL_TABLET | Freq: Every day | ORAL | 0 refills | Status: DC

## 2021-11-17 MED ORDER — AZITHROMYCIN 250 MG PO TABS: 250.0000 mg | ORAL_TABLET | Freq: Every day | ORAL | 0 refills | Status: DC

## 2021-11-17 MED ORDER — ALBUTEROL SULFATE (2.5 MG/3ML) 0.083% IN NEBU
2.5000 mg | INHALATION_SOLUTION | Freq: Once | RESPIRATORY_TRACT | Status: AC
  Administered 2021-11-17: 2.5 mg via RESPIRATORY_TRACT
  Filled 2021-11-17: qty 3

## 2021-11-17 MED ORDER — ALBUTEROL SULFATE HFA 108 (90 BASE) MCG/ACT IN AERS
1.0000 | INHALATION_SPRAY | Freq: Once | RESPIRATORY_TRACT | Status: AC
  Administered 2021-11-17: 1 via RESPIRATORY_TRACT
  Filled 2021-11-17: qty 6.7

## 2021-11-17 NOTE — ED Triage Notes (Signed)
Pt c/o sore throat with runny nose x 2 days

## 2021-11-17 NOTE — Discharge Instructions (Addendum)
Follow-up with your PCP/the health department within the next 2 to 3 days for reevaluation and continued medical management.  An antibiotic has been sent to the pharmacy.  Please take 2 tablets together on the first day, then take 1 tablet each day following.  This is 6 tablets in total, for 5 days of treatment.  Always take with food and plenty of water.  An allergy medicine is also been sent to the pharmacy by the name of Claritin.  Take 1 tablet daily to help reduce your congestion, and also help your asthma and return.  A refill for Singulair has also been sent to the pharmacy.  Use your albuterol inhaler for added relief as needed.  May be taken as often as 1 to 2 puffs every 4-6 hours.  Return to the ED for new or worsening symptoms as discussed.

## 2021-11-17 NOTE — ED Provider Notes (Incomplete)
[facility] EMERGENCY DEPARTMENT Provider Note   CSN: [id number] Arrival date & time: 11/17/21  0930     History {Add pertinent medical, surgical, social history, OB history to HPI:1} Chief Complaint  Patient presents with   Sore Throat    Tracy [name] is a 26 y.o. male with chief complaint of congestion and sore throat.  Congestion started on Friday or Saturday, cough developed 2 days later.  Hx of asthma, commonly gets his medications through the local health department.  Has been unable to take his Singulair for the last week.  Denies fever, chest pain, N/V/D, abdominal pain, neck stiffness, headache, vision changes.  His Advair tends to help with his asthma symptoms.  Noticed some mild tightness in his chest over the last 1 to 2 days but nothing major.  Denies shortness of breath, dyspnea, cough, or difficulty swallowing.    The history is provided by the patient and medical records.  Sore Throat       Home Medications Prior to Admission medications   Medication Sig Start Date End Date Taking? Authorizing Provider  montelukast (SINGULAIR) 10 MG tablet Take 1 tablet (10 mg total) by mouth at bedtime. 11/17/21  Yes Cecil Cobbs, PA-C      Allergies    Penicillins    Review of Systems   Review of Systems  HENT:  Positive for congestion and sore throat.     Physical Exam Updated Vital Signs BP (!) 153/98 (BP Location: Right Arm)   Pulse 73   Temp 98.1 F (36.7 C) (Oral)   Resp 17   SpO2 99%  Physical Exam Vitals and nursing note reviewed.  Constitutional:      General: He is not in acute distress.    Appearance: He is well-developed. He is not ill-appearing or diaphoretic.  HENT:     Head: Normocephalic and atraumatic.     Right Ear: Hearing, ear canal and external ear normal. Tympanic membrane is injected and bulging.     Left Ear: Hearing, ear canal and external ear normal. Tympanic membrane is injected and bulging.     Ears:     Comments: Loss of  bony landmarks behind TMs bilaterally.    Nose: Congestion and rhinorrhea present. Rhinorrhea is clear and purulent.     Right Turbinates: Enlarged.     Left Turbinates: Enlarged.     Mouth/Throat:     Lips: Pink.     Mouth: Mucous membranes are moist.     Tongue: Tongue does not deviate from midline.     Palate: No mass and lesions.     Pharynx: Oropharynx is clear. Uvula midline. Posterior oropharyngeal erythema present. No pharyngeal swelling, oropharyngeal exudate or uvula swelling.     Tonsils: No tonsillar exudate or tonsillar abscesses.     Comments: Mild erythema of the posterior oropharynx without tonsillar abnormalities. Eyes:     Conjunctiva/sclera: Conjunctivae normal.  Neck:     Comments: Neck very supple on exam, no meningismus Cardiovascular:     Rate and Rhythm: Normal rate and regular rhythm.     Heart sounds: No murmur heard. Pulmonary:     Effort: Pulmonary effort is normal. No tachypnea, accessory muscle usage, respiratory distress or retractions.     Breath sounds: No decreased air movement. Wheezing present. No decreased breath sounds.     Comments: Mild wheezing bilaterally.  Able to communicate without difficulty.  No increased respiratory effort. Abdominal:     Palpations: Abdomen is soft.  Tenderness: There is no abdominal tenderness.  Musculoskeletal:        General: No swelling.     Cervical back: Neck supple.  Lymphadenopathy:     Cervical: Cervical adenopathy present.  Skin:    General: Skin is warm and dry.     Capillary Refill: Capillary refill takes less than 2 seconds.  Neurological:     Mental Status: He is alert.  Psychiatric:        Mood and Affect: Mood normal.     ED Results / Procedures / Treatments   Labs (all labs ordered are listed, but only abnormal results are displayed) Labs Reviewed  GROUP A STREP BY PCR  SARS CORONAVIRUS 2 BY RT PCR    EKG None  Radiology No results found.  Procedures Procedures  {Document  cardiac monitor, telemetry assessment procedure when appropriate:1}  Medications Ordered in ED Medications  albuterol (VENTOLIN HFA) 108 (90 Base) MCG/ACT inhaler 1 puff (has no administration in time range)  albuterol (PROVENTIL) (2.5 MG/3ML) 0.083% nebulizer solution 2.5 mg (2.5 mg Nebulization Given 11/17/21 1150)    ED Course/ Medical Decision Making/ A&P                           Medical Decision Making Amount and/or Complexity of Data Reviewed External Data Reviewed: notes. Labs: ordered. Decision-making details documented in ED Course. Radiology: ordered and independent interpretation performed. Decision-making details documented in ED Course. ECG/medicine tests: ordered and independent interpretation performed. Decision-making details documented in ED Course.  Risk OTC drugs. Prescription drug management.   26 y.o. male presents to the ED for concern of Sore Throat   This involves an extensive number of treatment options, and is a complaint that carries with it a high risk of complications and morbidity.  The emergent differential diagnosis prior to evaluation includes, but is not limited to: Viral pharyngitis, strep pharyngitis, RPA, PTA, bronchitis, pneumonia  This is not an exhaustive differential.   Past Medical History / Co-morbidities / Social History: Hx of asthma  Additional History:  Internal and external records from outside source obtained and reviewed including ED visits  Physical Exam: Physical exam performed. The pertinent findings include: Mild bilateral wheezing, evidence of bilateral middle ear infections, cervical adenopathy  Lab Tests: I ordered, and personally interpreted labs.  The pertinent results include:   Group A Strep: Negative Covid: Negative  Imaging Studies: I ordered imaging studies including CXR .  I independently visualized and interpreted said imaging.  Pertinent results include: ***  I agree with the radiologist  interpretation.  ED Course: Pt well-appearing on exam.  ***.  Patient in NAD in good condition at time of discharge.  Disposition: After consideration of the diagnostic results and the patient's encounter today, I feel that the emergency department workup does not suggest an emergent condition requiring admission or immediate intervention beyond what has been performed at this time.  The patient is safe for discharge and has been instructed to return immediately for worsening symptoms, change in symptoms or any other concerns.  I have reviewed the patients home medicines and have made adjustments as needed.  Discussed course of treatment thoroughly with the patient, whom demonstrated understanding.  Patient in agreement and has no further questions.    I discussed this case with my attending physician Dr. Denton Lank, who agreed with the proposed treatment course and cosigned this note including patient's presenting symptoms, physical exam, and planned diagnostics and interventions.  Attending physician stated agreement with plan or made changes to plan which were implemented.     This chart was dictated using voice recognition software.  Despite best efforts to proofread, errors can occur which can change the documentation meaning.   {Document critical care time when appropriate:1} {Document review of labs and clinical decision tools ie heart score, Chads2Vasc2 etc:1}  {Document your independent review of radiology images, and any outside records:1} {Document your discussion with family members, caretakers, and with consultants:1} {Document social determinants of health affecting pt's care:1} {Document your decision making why or why not admission, treatments were needed:1} Final Clinical Impression(s) / ED Diagnoses Final diagnoses:  Acute otitis media, bilateral  Acute pharyngitis, unspecified etiology  Mild intermittent asthma, unspecified whether complicated    Rx / DC Orders ED Discharge  Orders          Ordered    montelukast (SINGULAIR) 10 MG tablet  Daily at bedtime        11/17/21 1227

## 2021-11-19 ENCOUNTER — Other Ambulatory Visit: Payer: Self-pay

## 2021-11-19 ENCOUNTER — Encounter (HOSPITAL_COMMUNITY): Payer: Self-pay

## 2021-11-19 ENCOUNTER — Emergency Department (HOSPITAL_COMMUNITY)
Admission: EM | Admit: 2021-11-19 | Discharge: 2021-11-19 | Disposition: A | Discharge status: Home / Self Care | Payer: Medicaid Other | Attending: Emergency Medicine | Admitting: Emergency Medicine

## 2021-11-19 DIAGNOSIS — R5381 Other malaise: Secondary | ICD-10-CM | POA: Insufficient documentation

## 2021-11-19 DIAGNOSIS — J029 Acute pharyngitis, unspecified: Secondary | ICD-10-CM | POA: Insufficient documentation

## 2021-11-19 DIAGNOSIS — H9203 Otalgia, bilateral: Secondary | ICD-10-CM | POA: Insufficient documentation

## 2021-11-19 MED ORDER — IBUPROFEN 800 MG PO TABS
800.0000 mg | ORAL_TABLET | Freq: Once | ORAL | Status: AC
  Administered 2021-11-19: 800 mg via ORAL
  Filled 2021-11-19: qty 1

## 2021-11-19 MED ORDER — FLUTICASONE PROPIONATE 50 MCG/ACT NA SUSP
2.0000 | Freq: Every day | NASAL | Status: DC
  Administered 2021-11-19: 2 via NASAL
  Filled 2021-11-19: qty 16

## 2021-11-19 MED ORDER — LIDOCAINE VISCOUS HCL 2 % MT SOLN
15.0000 mL | Freq: Once | OROMUCOSAL | Status: AC
  Administered 2021-11-19: 15 mL via OROMUCOSAL
  Filled 2021-11-19: qty 15

## 2021-11-19 NOTE — ED Triage Notes (Signed)
Patient presents to the ED with c/o a sore throat x 4 days. Patient reports bilateral ear pain and nasal congestion that started today.

## 2021-11-19 NOTE — ED Provider Notes (Signed)
[facility] EMERGENCY DEPARTMENT Provider Note   CSN: [id number] Arrival date & time: 11/19/21  1158     History Chief Complaint  Patient presents with   Sore Throat    Tracy [name] is a 26 y.o. male patient who presents to the emergency department for further evaluation of a sore throat, nasal congestion, general malaise, and ear pain.  This all started late last week.  Patient was seen evaluated in the emergency department on 11/17/2021 for similar symptoms.  Work-up was ultimately unrevealing.  Patient had negative strep and COVID testing at that time.  He was given a prescription for montelukast, loratadine, and Zithromax.  Patient has not filled the prescription yet.  Patient states that he was having some ear pain and wanted to make sure that his ears appeared normal and he wanted other work note.  He denies fevers, shortness of breath, chest pain.   Sore Throat       Home Medications Prior to Admission medications   Medication Sig Start Date End Date Taking? Authorizing Provider  azithromycin (ZITHROMAX) 250 MG tablet Take 1 tablet (250 mg total) by mouth daily. Take first 2 tablets together, then 1 every day until finished. 11/17/21   Cecil Cobbs, PA-C  loratadine (CLARITIN) 10 MG tablet Take 1 tablet (10 mg total) by mouth daily. 11/17/21   Cecil Cobbs, PA-C  montelukast (SINGULAIR) 10 MG tablet Take 1 tablet (10 mg total) by mouth at bedtime. 11/17/21   Cecil Cobbs, PA-C      Allergies    Penicillins    Review of Systems   Review of Systems  All other systems reviewed and are negative.   Physical Exam Updated Vital Signs BP 125/84 (BP Location: Left Arm)   Pulse 65   Temp 98.5 F (36.9 C) (Oral)   Resp 18   Ht 6\' 1"  (1.854 m)   Wt 95.3 kg   SpO2 99%   BMI 27.71 kg/m  Physical Exam Vitals and nursing note reviewed.  Constitutional:      Appearance: Normal appearance.  HENT:     Head: Normocephalic and atraumatic.     Ears:      Comments: Bilateral external auditory canals are normal-appearing without any signs of edema or significant erythema.  Bilateral TMs are normal-appearing without any evidence of bulging or rupture.    Mouth/Throat:     Comments: There is slight posterior pharyngeal erythema.  Tonsils are normal-appearing without any signs of exudate.  Uvula is midline and not edematous.  Tongue appears normal. Eyes:     General:        Right eye: No discharge.        Left eye: No discharge.     Conjunctiva/sclera: Conjunctivae normal.  Pulmonary:     Effort: Pulmonary effort is normal.  Skin:    General: Skin is warm and dry.     Findings: No rash.  Neurological:     General: No focal deficit present.     Mental Status: He is alert.  Psychiatric:        Mood and Affect: Mood normal.        Behavior: Behavior normal.     ED Results / Procedures / Treatments   Labs (all labs ordered are listed, but only abnormal results are displayed) Labs Reviewed - No data to display  EKG None  Radiology No results found.  Procedures Procedures    Medications Ordered in ED Medications  fluticasone (FLONASE)  50 MCG/ACT nasal spray 2 spray (2 sprays Each Nare Given 11/19/21 1230)  ibuprofen (ADVIL) tablet 800 mg (800 mg Oral Given 11/19/21 1226)  lidocaine (XYLOCAINE) 2 % viscous mouth solution 15 mL (15 mLs Mouth/Throat Given 11/19/21 1226)    ED Course/ Medical Decision Making/ A&P                           Medical Decision Making Tracy [name] is a 26 y.o. male who presents to the emergency department today for further evaluation of general malaise, sore throat, and ear pain.  Ears do not appear to be infected bilaterally.  I would low suspicion for otitis externa, otitis media and malignant otitis externa.  No mastoid tenderness.  Patient had negative strep and COVID testing 2 days ago.  I do not feel that repeating the testing today would be of benefit.  I will give the patient some fluticasone spray  to help with the nasal congestion and ear fullness that he is likely experiencing, viscous lidocaine for his sore throat, and some ibuprofen as the patient has not been taking any medications at the house.  I instructed him that he needs to fill his prescriptions and that this is likely a virus and will need symptomatic management.  I will write him out for 1 more day of work and discussed strict return precautions with him at the bedside.  I have a low to suspicion at this time for retropharyngeal abscess or peritonsillar abscess.  Patient is nontoxic-appearing and answers all my questions appropriately in complete sentences.  No respiratory stridor.  He is safe for discharge at this time.   Risk Prescription drug management.    Final Clinical Impression(s) / ED Diagnoses Final diagnoses:  Pharyngitis, unspecified etiology  Otalgia of both ears    Rx / DC Orders ED Discharge Orders     None         Jolyn Lent 11/19/21 1240    Eber Hong, MD 11/19/21 2110

## 2021-11-19 NOTE — Discharge Instructions (Signed)
Please go pick up your prescriptions.  I would like for you to drink plenty of fluids and get plenty of rest.  I have written you out for for today and tomorrow for work.  Please return to the emergency department for any worsening symptoms you might have.

## 2021-11-24 ENCOUNTER — Ambulatory Visit
Admission: EM | Admit: 2021-11-24 | Discharge: 2021-11-24 | Disposition: A | Discharge status: Home / Self Care | Payer: Medicaid Other

## 2021-11-24 DIAGNOSIS — J4541 Moderate persistent asthma with (acute) exacerbation: Secondary | ICD-10-CM

## 2021-11-24 DIAGNOSIS — J069 Acute upper respiratory infection, unspecified: Secondary | ICD-10-CM

## 2021-11-24 MED ORDER — PREDNISONE 20 MG PO TABS: 40.0000 mg | ORAL_TABLET | Freq: Every day | ORAL | 0 refills | Status: DC

## 2021-11-24 NOTE — ED Notes (Signed)
Pt is outside on a phone call.

## 2021-12-09 ENCOUNTER — Emergency Department (HOSPITAL_COMMUNITY)
Admission: EM | Admit: 2021-12-09 | Discharge: 2021-12-09 | Disposition: A | Discharge status: Home / Self Care | Payer: Self-pay | Attending: Emergency Medicine | Admitting: Emergency Medicine

## 2021-12-09 ENCOUNTER — Other Ambulatory Visit: Payer: Self-pay

## 2021-12-09 ENCOUNTER — Encounter (HOSPITAL_COMMUNITY): Payer: Self-pay

## 2021-12-09 DIAGNOSIS — J4520 Mild intermittent asthma, uncomplicated: Secondary | ICD-10-CM | POA: Insufficient documentation

## 2021-12-09 DIAGNOSIS — J069 Acute upper respiratory infection, unspecified: Secondary | ICD-10-CM | POA: Insufficient documentation

## 2021-12-09 DIAGNOSIS — Z7951 Long term (current) use of inhaled steroids: Secondary | ICD-10-CM | POA: Insufficient documentation

## 2021-12-09 DIAGNOSIS — Z20822 Contact with and (suspected) exposure to covid-19: Secondary | ICD-10-CM | POA: Insufficient documentation

## 2021-12-09 LAB — RESP PANEL BY RT-PCR (FLU A&B, COVID) ARPGX2
Influenza A by PCR: NEGATIVE
Influenza B by PCR: NEGATIVE
SARS Coronavirus 2 by RT PCR: NEGATIVE

## 2021-12-09 LAB — GROUP A STREP BY PCR: Group A Strep by PCR: NOT DETECTED

## 2021-12-09 MED ORDER — PREDNISONE 20 MG PO TABS: 40.0000 mg | ORAL_TABLET | Freq: Every day | ORAL | 0 refills | Status: DC

## 2021-12-09 MED ORDER — PREDNISONE 20 MG PO TABS
40.0000 mg | ORAL_TABLET | Freq: Once | ORAL | Status: AC
  Administered 2021-12-09: 40 mg via ORAL
  Filled 2021-12-09: qty 2

## 2021-12-09 MED ORDER — FLUTICASONE-SALMETEROL 45-21 MCG/ACT IN AERO: 2.0000 | INHALATION_SPRAY | Freq: Two times a day (BID) | RESPIRATORY_TRACT | 0 refills | Status: DC

## 2021-12-09 NOTE — ED Triage Notes (Signed)
Patient presents to the ED with shortness of breath.  Patient states that he felt like his throat was closing up.  Patient states that he lives with someone that smokes as well and thinks has made his allergies flare up.  Patient is ambulatory and talking in full and complete sentences.

## 2021-12-09 NOTE — ED Notes (Signed)
Per pt request, note faxed to "the court" to excuse him from a court date today. Number note faxed to - [phone]

## 2021-12-09 NOTE — Discharge Instructions (Signed)
Your strep test today was negative.  Your COVID and flu test is pending.  You may review the results later today on MyChart.  Start the prednisone prescription tomorrow.  Use your albuterol inhaler as directed if needed for wheezing or shortness of breath.  Follow-up with one of the primary care clinics listed to establish primary care, return emergency department for any new or worsening symptoms.

## 2021-12-09 NOTE — ED Provider Notes (Signed)
[facility] EMERGENCY DEPARTMENT Provider Note   CSN: [id number] Arrival date & time: 12/09/21  0846     History  Chief Complaint  Patient presents with   Shortness of Breath    Tracy [name] is a 26 y.o. male.   Shortness of Breath Associated symptoms: cough, ear pain and sore throat   Associated symptoms: no abdominal pain, no chest pain, no fever, no headaches and no vomiting        Tracy [name] is a 26 y.o. male with past medical history of asthma and anxiety who presents to the Emergency Department complaining of foreign body sensation of his throat and shortness of breath.  States that he lives with someone who smokes and believes this is making his asthma worse.  He also states that he typically uses Advair but has ran out of his inhaler.  He does have a rescue inhaler but does not like to use them because of the side effects.  He feels as though he has a "lump" stuck in his throat.  He also endorses some pain to both the ears and nasal congestion with sore throat.  No fever or chills.  Intermittent cough without wheezing cough has been nonproductive.  Home Medications Prior to Admission medications   Medication Sig Start Date End Date Taking? Authorizing Provider  azithromycin (ZITHROMAX) 250 MG tablet Take 1 tablet (250 mg total) by mouth daily. Take first 2 tablets together, then 1 every day until finished. 11/17/21   Cecil Cobbs, PA-C  Fluticasone-Salmeterol (ADVAIR HFA IN) Inhale into the lungs.    [provider]  loratadine (CLARITIN) 10 MG tablet Take 1 tablet (10 mg total) by mouth daily. 11/17/21   Cecil Cobbs, PA-C  montelukast (SINGULAIR) 10 MG tablet Take 1 tablet (10 mg total) by mouth at bedtime. 11/17/21   Cecil Cobbs, PA-C  predniSONE (DELTASONE) 20 MG tablet Take 2 tablets (40 mg total) by mouth daily with breakfast. 11/24/21   Particia Nearing, PA-C  VITAMIN D PO Take by mouth.    [provider]       Allergies    Penicillins    Review of Systems   Review of Systems  Constitutional:  Negative for appetite change, chills and fever.  HENT:  Positive for congestion, ear pain and sore throat. Negative for trouble swallowing and voice change.   Respiratory:  Positive for cough and shortness of breath.   Cardiovascular:  Negative for chest pain.  Gastrointestinal:  Negative for abdominal pain, nausea and vomiting.  Neurological:  Negative for headaches.    Physical Exam Updated Vital Signs BP (!) 139/95   Pulse 71   Temp 98.5 F (36.9 C) (Oral)   Resp 19   Ht 6\' 1"  (1.854 m)   Wt 95.3 kg   SpO2 100%   BMI 27.71 kg/m  Physical Exam Vitals and nursing note reviewed.  Constitutional:      General: He is not in acute distress.    Appearance: He is well-developed. He is not ill-appearing.  HENT:     Left Ear: Tympanic membrane and ear canal normal.     Ears:     Comments: Erythema of the right TM.  No perforation or edema of the canal    Mouth/Throat:     Mouth: Mucous membranes are moist.     Pharynx: Oropharynx is clear. Posterior oropharyngeal erythema present.     Comments: Erythema of the oropharynx without vesicle or exudates.  Uvula midline nonedematous. Cardiovascular:     Rate and Rhythm: Normal rate and regular rhythm.     Pulses: Normal pulses.  Pulmonary:     Effort: Pulmonary effort is normal. No respiratory distress.     Breath sounds: Normal breath sounds. No wheezing or rales.  Abdominal:     Palpations: Abdomen is soft.     Tenderness: There is no abdominal tenderness.  Musculoskeletal:        General: Normal range of motion.     Cervical back: Normal range of motion.  Lymphadenopathy:     Cervical: No cervical adenopathy.  Skin:    General: Skin is warm.     Capillary Refill: Capillary refill takes less than 2 seconds.  Neurological:     General: No focal deficit present.     Mental Status: He is alert.     Sensory: No sensory deficit.      Motor: No weakness.     ED Results / Procedures / Treatments   Labs (all labs ordered are listed, but only abnormal results are displayed) Labs Reviewed  GROUP A STREP BY PCR  RESP PANEL BY RT-PCR (FLU A&B, COVID) [id number]    EKG None  Radiology No results found.  Procedures Procedures    Medications Ordered in ED Medications - No data to display  ED Course/ Medical Decision Making/ A&P                           Medical Decision Making Patient with history of asthma here with complaint of shortness of breath and foreign body sensation of the throat.  He notes having some URI symptoms for several days as well.  No fever or chills.  Intermittent cough mostly nonproductive.  On exam, patient well-appearing nontoxic.  No increased work of breathing and lungs are clear to auscultation bilaterally.  He does have some erythema of the right TM w/o ear pain.   erythema of the oropharynx.  No edema or exudates of the oropharynx or tonsils and his uvula is midline and nonedematous.  Will obtain COVID and strep test.  His vital signs are reassuring and there is no respiratory distress noted.  No indication for need of chest x-ray at this time.  Amount and/or Complexity of Data Reviewed Labs: ordered.    Details: COVID and strep test negative. Discussion of management or test interpretation with external provider(s): Patient here with likely viral process.  I will refill his Advair, he states he has albuterol at home.  Prescription for prednisone as well.  At time of d/c pt requested documentation of hospital visit for a missed court date.    Risk Prescription drug management.           Final Clinical Impression(s) / ED Diagnoses Final diagnoses:  Upper respiratory tract infection, unspecified type  Mild intermittent asthma without complication    Rx / DC Orders ED Discharge Orders     None         Pauline Aus, PA-C 12/11/21 2233    Vanetta Mulders,  MD 12/12/21 1605

## 2022-01-16 ENCOUNTER — Encounter (HOSPITAL_COMMUNITY): Payer: Self-pay

## 2022-01-16 ENCOUNTER — Other Ambulatory Visit: Payer: Self-pay

## 2022-01-16 ENCOUNTER — Emergency Department (HOSPITAL_COMMUNITY)
Admission: EM | Admit: 2022-01-16 | Discharge: 2022-01-16 | Disposition: A | Discharge status: Home / Self Care | Payer: Self-pay | Attending: Emergency Medicine | Admitting: Emergency Medicine

## 2022-01-16 DIAGNOSIS — R519 Headache, unspecified: Secondary | ICD-10-CM | POA: Insufficient documentation

## 2022-01-16 LAB — CBG MONITORING, ED: Glucose-Capillary: 86 mg/dL (ref 70–99)

## 2022-01-16 MED ORDER — IBUPROFEN 800 MG PO TABS: 800.0000 mg | ORAL_TABLET | Freq: Three times a day (TID) | ORAL | 0 refills | Status: DC | PRN

## 2022-01-16 MED ORDER — IBUPROFEN 800 MG PO TABS
800.0000 mg | ORAL_TABLET | Freq: Once | ORAL | Status: AC
  Administered 2022-01-16: 800 mg via ORAL
  Filled 2022-01-16: qty 1

## 2022-01-16 NOTE — ED Triage Notes (Signed)
Pt arrived via [facility] from home c/o sudden posterior, right headache and describes it as pulsating. Pt reports Hx of anxiety and didn't feel safe driving himself to ED for evaluation. Pt presents in NAD, A+O X 4, ambulatory but does endorse some dizziness when he changes position to standing. Pt does report he does not take any medication for anxiety control and did not take any OTC medication for the headache PTA.

## 2022-01-16 NOTE — Discharge Instructions (Signed)
Follow-up with your doctor next week if not improving °

## 2022-01-16 NOTE — ED Provider Notes (Signed)
[facility] EMERGENCY DEPARTMENT Provider Note   CSN: [id number] Arrival date & time: 01/16/22  1909     History {Add pertinent medical, surgical, social history, OB history to HPI:1} Chief Complaint  Patient presents with   Headache    Tracy [name] is a 26 y.o. male.  Patient complains of a headache to the right occipital area.  He states it was bad before but now it Is about a 5.   Headache      Home Medications Prior to Admission medications   Medication Sig Start Date End Date Taking? Authorizing Provider  azithromycin (ZITHROMAX) 250 MG tablet Take 1 tablet (250 mg total) by mouth daily. Take first 2 tablets together, then 1 every day until finished. 11/17/21   Cecil Cobbs, PA-C  fluticasone-salmeterol (ADVAIR HFA) [phone] MCG/ACT inhaler Inhale 2 puffs into the lungs 2 (two) times daily. 12/09/21   Triplett, Tammy, PA-C  ibuprofen (ADVIL) 800 MG tablet Take 1 tablet (800 mg total) by mouth every 8 (eight) hours as needed. 01/16/22   Bethann Berkshire, MD  loratadine (CLARITIN) 10 MG tablet Take 1 tablet (10 mg total) by mouth daily. 11/17/21   Cecil Cobbs, PA-C  montelukast (SINGULAIR) 10 MG tablet Take 1 tablet (10 mg total) by mouth at bedtime. 11/17/21   Cecil Cobbs, PA-C  predniSONE (DELTASONE) 20 MG tablet Take 2 tablets (40 mg total) by mouth daily. 12/09/21   Triplett, Tammy, PA-C  VITAMIN D PO Take by mouth.    [provider]      Allergies    Penicillins    Review of Systems   Review of Systems  Neurological:  Positive for headaches.    Physical Exam Updated Vital Signs BP (!) 140/102 (BP Location: Right Arm)   Pulse 63   Temp 98.1 F (36.7 C) (Oral)   Resp 16   Ht 6\' 1"  (1.854 m)   Wt 95.3 kg   SpO2 96%   BMI 27.71 kg/m  Physical Exam  ED Results / Procedures / Treatments   Labs (all labs ordered are listed, but only abnormal results are displayed) Labs Reviewed  CBG MONITORING, ED    EKG None  Radiology No  results found.  Procedures Procedures  {Document cardiac monitor, telemetry assessment procedure when appropriate:1}  Medications Ordered in ED Medications  ibuprofen (ADVIL) tablet 800 mg (800 mg Oral Given 01/16/22 2001)    ED Course/ Medical Decision Making/ A&P                           Medical Decision Making Risk Prescription drug management.   Patient with a tension headache.  Patient improved with Motrin will follow-up as needed  {Document critical care time when appropriate:1} {Document review of labs and clinical decision tools ie heart score, Chads2Vasc2 etc:1}  {Document your independent review of radiology images, and any outside records:1} {Document your discussion with family members, caretakers, and with consultants:1} {Document social determinants of health affecting pt's care:1} {Document your decision making why or why not admission, treatments were needed:1} Final Clinical Impression(s) / ED Diagnoses Final diagnoses:  Bad headache    Rx / DC Orders ED Discharge Orders          Ordered    ibuprofen (ADVIL) 800 MG tablet  Every 8 hours PRN,   Status:  Discontinued        01/16/22 2020    ibuprofen (ADVIL) 800 MG tablet  Every 8 hours PRN        01/16/22 2021

## 2022-01-16 NOTE — ED Notes (Signed)
Pt d/c home per MD order. Discharge summary reviewed with pt, pt verbalizes understanding. No s/s of acute distress noted at discharge.  

## 2022-05-03 ENCOUNTER — Other Ambulatory Visit: Payer: Self-pay

## 2022-05-03 ENCOUNTER — Emergency Department (HOSPITAL_COMMUNITY)
Admission: EM | Admit: 2022-05-03 | Discharge: 2022-05-03 | Disposition: A | Discharge status: Home / Self Care | Payer: Medicaid Other | Attending: Emergency Medicine | Admitting: Emergency Medicine

## 2022-05-03 ENCOUNTER — Encounter (HOSPITAL_COMMUNITY): Payer: Self-pay | Admitting: Emergency Medicine

## 2022-05-03 DIAGNOSIS — T783XXA Angioneurotic edema, initial encounter: Secondary | ICD-10-CM | POA: Insufficient documentation

## 2022-05-03 DIAGNOSIS — T7840XA Allergy, unspecified, initial encounter: Secondary | ICD-10-CM | POA: Diagnosis present

## 2022-05-03 MED ORDER — PREDNISONE 10 MG PO TABS: 50.0000 mg | ORAL_TABLET | Freq: Every day | ORAL | 0 refills | Status: DC

## 2022-05-03 MED ORDER — DIPHENHYDRAMINE HCL 25 MG PO CAPS: 25.0000 mg | ORAL_CAPSULE | Freq: Four times a day (QID) | ORAL | 0 refills | Status: DC | PRN

## 2022-05-03 MED ORDER — DIPHENHYDRAMINE HCL 25 MG PO CAPS
50.0000 mg | ORAL_CAPSULE | Freq: Once | ORAL | Status: AC
  Administered 2022-05-03: 50 mg via ORAL
  Filled 2022-05-03: qty 2

## 2022-05-03 MED ORDER — PREDNISONE 10 MG PO TABS
60.0000 mg | ORAL_TABLET | Freq: Once | ORAL | Status: AC
  Administered 2022-05-03: 60 mg via ORAL
  Filled 2022-05-03: qty 1

## 2022-05-03 MED ORDER — DEXAMETHASONE SODIUM PHOSPHATE 10 MG/ML IJ SOLN
10.0000 mg | Freq: Once | INTRAMUSCULAR | Status: DC
  Filled 2022-05-03: qty 1

## 2022-05-03 MED ORDER — EPINEPHRINE 0.3 MG/0.3ML IJ SOAJ: 0.3000 mg | Freq: Once | INTRAMUSCULAR | Status: DC

## 2022-05-03 MED ORDER — EPINEPHRINE 0.3 MG/0.3ML IJ SOAJ
0.3000 mg | Freq: Once | INTRAMUSCULAR | Status: DC
  Filled 2022-05-03: qty 0.3

## 2022-05-03 NOTE — ED Triage Notes (Signed)
Patient c/o possible allergic reaction to raw broccoli. Per patient ate broccoli approx 45 minutes prior to coming to ER. Patient states "tongue and throat feeling weird." Patient able to handle oral secretions at this time. O2 sat 100% on room air.

## 2022-05-03 NOTE — Discharge Instructions (Signed)
We saw you in the ER for tingling sensation to your tongue, swelling of your tongue -which we suspect is because of allergic reaction.  The reaction is severe, however, it appears to be in control and there is no increased swelling or any difficulty in breathing noted. We are not sure what caused the reaction, and it is important for you to follow up with an allergist. Please take the medications prescribed. PLEASE RETURN TO THE ER IMMEDIATELY IN CASE YOU START HAVING WORSENING SWELLING, DIFFICULTY IN BREATHING ETC.

## 2022-05-03 NOTE — ED Provider Notes (Addendum)
[facility] EMERGENCY DEPARTMENT Provider Note   CSN: [id number] Arrival date & time: 05/03/22  2585     History  Chief Complaint  Patient presents with   Allergic Reaction    Tracy [name] is a 26 y.o. male.  HPI    26 year old male comes in with chief complaint of allergic reaction. Patient is allergic to penicillin, no other known allergies.  He states that he had raw broccoli about 45 minutes prior to ED arrival, and soon after started feeling like his tongue was tingling and swelling.  He also felt  " weird" in his throat.  Patient able to swallow his saliva fine.  He denies any associated nausea, wheezing, shortness of breath, rash.  No history of similar symptoms in the past.  Patient states that he woke up feeling well and did not have any sore throat.   Home Medications Prior to Admission medications   Medication Sig Start Date End Date Taking? Authorizing Provider  diphenhydrAMINE (BENADRYL) 25 mg capsule Take 1 capsule (25 mg total) by mouth every 6 (six) hours as needed for itching. 05/03/22  Yes Theoren Palka, MD  predniSONE (DELTASONE) 10 MG tablet Take 5 tablets (50 mg total) by mouth daily. 05/04/22  Yes Jiles Goya, MD  azithromycin (ZITHROMAX) 250 MG tablet Take 1 tablet (250 mg total) by mouth daily. Take first 2 tablets together, then 1 every day until finished. 11/17/21   Cecil Cobbs, PA-C  fluticasone-salmeterol (ADVAIR HFA) [phone] MCG/ACT inhaler Inhale 2 puffs into the lungs 2 (two) times daily. 12/09/21   Triplett, Tammy, PA-C  ibuprofen (ADVIL) 800 MG tablet Take 1 tablet (800 mg total) by mouth every 8 (eight) hours as needed. 01/16/22   Bethann Berkshire, MD  loratadine (CLARITIN) 10 MG tablet Take 1 tablet (10 mg total) by mouth daily. 11/17/21   Cecil Cobbs, PA-C  montelukast (SINGULAIR) 10 MG tablet Take 1 tablet (10 mg total) by mouth at bedtime. 11/17/21   Cecil Cobbs, PA-C  VITAMIN D PO Take by mouth.    [provider]       Allergies    Penicillins    Review of Systems   Review of Systems  Physical Exam Updated Vital Signs BP (!) 150/91 (BP Location: Right Arm)   Pulse 91   Temp 98.6 F (37 C) (Oral)   Resp 18   Ht 6' (1.829 m)   Wt 95.3 kg   SpO2 100%   BMI 28.48 kg/m  Physical Exam Vitals and nursing note reviewed.  Constitutional:      Appearance: He is well-developed.  HENT:     Head: Atraumatic.     Mouth/Throat:     Pharynx: No oropharyngeal exudate or posterior oropharyngeal erythema.     Comments: Mild uvula edema, no tonsillar swelling, no trismus, no drooling, no stridor Cardiovascular:     Rate and Rhythm: Normal rate.  Pulmonary:     Effort: Pulmonary effort is normal.     Breath sounds: No wheezing.  Musculoskeletal:     Cervical back: Neck supple.  Skin:    General: Skin is warm.  Neurological:     Mental Status: He is alert and oriented to person, place, and time.     ED Results / Procedures / Treatments   Labs (all labs ordered are listed, but only abnormal results are displayed) Labs Reviewed - No data to display  EKG None  Radiology No results found.  Procedures Procedures  Medications Ordered in ED Medications  diphenhydrAMINE (BENADRYL) capsule 50 mg (has no administration in time range)  predniSONE (DELTASONE) tablet 60 mg (has no administration in time range)  EPINEPHrine (EPI-PEN) injection 0.3 mg (has no administration in time range)    ED Course/ Medical Decision Making/ A&P Clinical Course as of 05/03/22 1136  Sun May 03, 2022  1134 Patient declined IM dexamethasone and IM epi.  We are now 4 hours since the insult.  Patient has not gotten worse.  I am comfortable with this request from the patient.  Oral prednisone ordered now.  He is stable for discharge, given that his symptoms have not progressed. [AN]  1136 Patient is stable, no progression of symptoms.  Return precautions discussed. [AN]    Clinical Course User Index [AN]  Varney Biles, MD                           Medical Decision Making 26 year old male comes in with chief complaint of tingling sensation in his tongue, weird sensation to his throat.  He does not have any active respiratory symptoms and does not have any clear evidence of tongue swelling, although uvula does appear slightly edematous.   Differential diagnosis considered includes angioedema and globus sensation.  I am leaning more towards this being globus sensation, except for the fact that tongue " swelling" is appreciated by the patient.  Although there is no respiratory symptoms and airway issues right now, and no other systems involved -we will be conservative and give patient IM epinephrine and IM dexamethasone.  Oral Benadryl given.  Will observe him in the ER for short while.   Risk Prescription drug management.     Final Clinical Impression(s) / ED Diagnoses Final diagnoses:  Angioedema, initial encounter    Rx / DC Orders ED Discharge Orders          Ordered    predniSONE (DELTASONE) 10 MG tablet  Daily        05/03/22 1135    diphenhydrAMINE (BENADRYL) 25 mg capsule  Every 6 hours PRN        05/03/22 1135              Varney Biles, MD 05/03/22 1136    Varney Biles, MD 05/03/22 1136

## 2022-05-20 DIAGNOSIS — J45909 Unspecified asthma, uncomplicated: Secondary | ICD-10-CM | POA: Diagnosis not present

## 2022-05-20 DIAGNOSIS — I10 Essential (primary) hypertension: Secondary | ICD-10-CM | POA: Diagnosis not present

## 2022-05-20 DIAGNOSIS — E559 Vitamin D deficiency, unspecified: Secondary | ICD-10-CM | POA: Diagnosis not present

## 2022-05-20 DIAGNOSIS — Z5941 Food insecurity: Secondary | ICD-10-CM | POA: Diagnosis not present

## 2022-07-10 ENCOUNTER — Telehealth: Payer: Self-pay

## 2022-07-10 NOTE — Telephone Encounter (Signed)
Mychart msg sent. AS, CMA

## 2022-08-20 ENCOUNTER — Telehealth (HOSPITAL_COMMUNITY): Payer: Self-pay | Admitting: *Deleted

## 2022-08-20 ENCOUNTER — Encounter (HOSPITAL_COMMUNITY): Payer: Self-pay | Admitting: *Deleted

## 2022-08-20 ENCOUNTER — Ambulatory Visit (HOSPITAL_COMMUNITY)
Admission: EM | Admit: 2022-08-20 | Discharge: 2022-08-20 | Disposition: A | Discharge status: Home / Self Care | Payer: Medicaid Other | Attending: Internal Medicine | Admitting: Internal Medicine

## 2022-08-20 DIAGNOSIS — N4889 Other specified disorders of penis: Secondary | ICD-10-CM | POA: Diagnosis not present

## 2022-08-20 DIAGNOSIS — R300 Dysuria: Secondary | ICD-10-CM | POA: Insufficient documentation

## 2022-08-20 DIAGNOSIS — J4531 Mild persistent asthma with (acute) exacerbation: Secondary | ICD-10-CM | POA: Diagnosis not present

## 2022-08-20 DIAGNOSIS — J069 Acute upper respiratory infection, unspecified: Secondary | ICD-10-CM | POA: Insufficient documentation

## 2022-08-20 DIAGNOSIS — R059 Cough, unspecified: Secondary | ICD-10-CM | POA: Diagnosis not present

## 2022-08-20 DIAGNOSIS — Z1152 Encounter for screening for COVID-19: Secondary | ICD-10-CM | POA: Diagnosis not present

## 2022-08-20 DIAGNOSIS — Z113 Encounter for screening for infections with a predominantly sexual mode of transmission: Secondary | ICD-10-CM | POA: Insufficient documentation

## 2022-08-20 MED ORDER — PREDNISONE 20 MG PO TABS
20.0000 mg | ORAL_TABLET | Freq: Every day | ORAL | 0 refills | Status: AC
Start: 2022-08-20 — End: 2022-08-25

## 2022-08-20 MED ORDER — ALBUTEROL SULFATE HFA 108 (90 BASE) MCG/ACT IN AERS: 1.0000 | INHALATION_SPRAY | Freq: Four times a day (QID) | RESPIRATORY_TRACT | 0 refills | Status: DC | PRN

## 2022-08-20 MED ORDER — PREDNISONE 20 MG PO TABS: 20.0000 mg | ORAL_TABLET | Freq: Every day | ORAL | 0 refills | Status: DC

## 2022-08-20 MED ORDER — BENZONATATE 100 MG PO CAPS
100.0000 mg | ORAL_CAPSULE | Freq: Three times a day (TID) | ORAL | 0 refills | Status: DC | PRN
Start: 2022-08-20 — End: 2023-03-21

## 2022-08-20 MED ORDER — BENZONATATE 100 MG PO CAPS: 100.0000 mg | ORAL_CAPSULE | Freq: Three times a day (TID) | ORAL | 0 refills | Status: DC | PRN
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
## 2022-08-20 NOTE — Discharge Instructions (Addendum)
Please maintain adequate hydration Use medications as prescribed Please use your inhalers as needed If you have worsening symptoms please return to urgent care Will call you with recommendations if labs are abnormal

## 2022-08-20 NOTE — Telephone Encounter (Signed)
Pt needed rx changed to [facility]

## 2022-08-21 LAB — CYTOLOGY, (ORAL, ANAL, URETHRAL) ANCILLARY ONLY
Chlamydia: NEGATIVE
Comment: NEGATIVE
Comment: NEGATIVE
Comment: NORMAL
Neisseria Gonorrhea: NEGATIVE
Trichomonas: NEGATIVE

## 2022-08-21 LAB — SARS CORONAVIRUS 2 (TAT 6-24 HRS): SARS Coronavirus 2: NEGATIVE
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
## 2022-08-25 ENCOUNTER — Ambulatory Visit (HOSPITAL_COMMUNITY)
Admission: EM | Admit: 2022-08-25 | Discharge: 2022-08-25 | Disposition: A | Discharge status: Home / Self Care | Payer: Medicaid Other | Attending: Internal Medicine | Admitting: Internal Medicine

## 2022-08-25 ENCOUNTER — Encounter (HOSPITAL_COMMUNITY): Payer: Self-pay | Admitting: *Deleted

## 2022-08-25 DIAGNOSIS — R1084 Generalized abdominal pain: Secondary | ICD-10-CM

## 2022-08-25 DIAGNOSIS — R112 Nausea with vomiting, unspecified: Secondary | ICD-10-CM | POA: Diagnosis not present

## 2022-08-25 DIAGNOSIS — R197 Diarrhea, unspecified: Secondary | ICD-10-CM | POA: Diagnosis not present

## 2022-08-25 MED ORDER — ONDANSETRON 4 MG PO TBDP: 4.0000 mg | ORAL_TABLET | Freq: Three times a day (TID) | ORAL | 0 refills | Status: DC | PRN

## 2022-08-25 MED ORDER — ONDANSETRON HCL 4 MG/2ML IJ SOLN
4.0000 mg | Freq: Once | INTRAMUSCULAR | Status: AC
  Administered 2022-08-25: 4 mg via INTRAMUSCULAR

## 2022-08-25 MED ORDER — ONDANSETRON HCL 4 MG/2ML IJ SOLN
INTRAMUSCULAR | Status: AC
  Filled 2022-08-25: qty 2

## 2022-08-25 NOTE — Discharge Instructions (Signed)
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
## 2022-08-25 NOTE — ED Triage Notes (Signed)
Pt states he is having mid abdominal pain which started last night, around 3am he started vomiting, diarrhea and feels dizzy. He states the pain started after he ate dinner last night. He is on meds from last office visit.   Pt pulled alarm in lobby restroom states he is felt like he was going to pass out.

## 2023-01-28 ENCOUNTER — Emergency Department (HOSPITAL_COMMUNITY)
Admission: EM | Admit: 2023-01-28 | Discharge: 2023-01-28 | Disposition: A | Discharge status: Home / Self Care | Payer: Medicaid Other | Attending: Emergency Medicine | Admitting: Emergency Medicine

## 2023-01-28 ENCOUNTER — Other Ambulatory Visit: Payer: Self-pay

## 2023-01-28 ENCOUNTER — Encounter (HOSPITAL_COMMUNITY): Payer: Self-pay | Admitting: Emergency Medicine

## 2023-01-28 DIAGNOSIS — R002 Palpitations: Secondary | ICD-10-CM | POA: Diagnosis not present

## 2023-01-28 DIAGNOSIS — J45909 Unspecified asthma, uncomplicated: Secondary | ICD-10-CM | POA: Diagnosis not present

## 2023-01-28 DIAGNOSIS — Z8616 Personal history of COVID-19: Secondary | ICD-10-CM | POA: Diagnosis not present

## 2023-01-28 DIAGNOSIS — R55 Syncope and collapse: Secondary | ICD-10-CM | POA: Diagnosis present

## 2023-01-28 LAB — CBC WITH DIFFERENTIAL/PLATELET
Abs Immature Granulocytes: 0.02 10*3/uL (ref 0.00–0.07)
Basophils Absolute: 0 10*3/uL (ref 0.0–0.1)
Basophils Relative: 1 %
Eosinophils Absolute: 0.2 10*3/uL (ref 0.0–0.5)
Eosinophils Relative: 5 %
HCT: 40.7 % (ref 39.0–52.0)
Hemoglobin: 14.4 g/dL (ref 13.0–17.0)
Immature Granulocytes: 0 %
Lymphocytes Relative: 52 %
Lymphs Abs: 2.5 10*3/uL (ref 0.7–4.0)
MCH: 30.3 pg (ref 26.0–34.0)
MCHC: 35.4 g/dL (ref 30.0–36.0)
MCV: 85.5 fL (ref 80.0–100.0)
Monocytes Absolute: 0.4 10*3/uL (ref 0.1–1.0)
Monocytes Relative: 8 %
Neutro Abs: 1.6 10*3/uL — ABNORMAL LOW (ref 1.7–7.7)
Neutrophils Relative %: 34 %
Platelets: 250 10*3/uL (ref 150–400)
RBC: 4.76 MIL/uL (ref 4.22–5.81)
RDW: 12.3 % (ref 11.5–15.5)
WBC: 4.7 10*3/uL (ref 4.0–10.5)
nRBC: 0 % (ref 0.0–0.2)

## 2023-01-28 LAB — BASIC METABOLIC PANEL
Anion gap: 7 (ref 5–15)
BUN: 14 mg/dL (ref 6–20)
CO2: 26 mmol/L (ref 22–32)
Calcium: 8.7 mg/dL — ABNORMAL LOW (ref 8.9–10.3)
Chloride: 102 mmol/L (ref 98–111)
Creatinine, Ser: 1.11 mg/dL (ref 0.61–1.24)
GFR, Estimated: >60 mL/min (ref >60)
Glucose, Bld: 101 mg/dL — ABNORMAL HIGH (ref 70–99)
Potassium: 3.3 mmol/L — ABNORMAL LOW (ref 3.5–5.1)
Sodium: 135 mmol/L (ref 135–145)
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
## 2023-01-28 NOTE — ED Triage Notes (Addendum)
Pt to ed pov. C/o of sudden pressure in head, face, and chest. Pt states not sure if it's r/t anxiety. Pt states "my head feels weird". Pt states throat and nose is dry and feels like they are going to pass out. Pt is ambulatory

## 2023-01-28 NOTE — Discharge Instructions (Signed)
You were seen today with an episode of near syncope.  Your workup is reassuring including EKGs and basic lab work.  Make sure that you stay hydrated.

## 2023-02-26 ENCOUNTER — Emergency Department (HOSPITAL_COMMUNITY)
Admission: EM | Admit: 2023-02-26 | Discharge: 2023-02-26 | Disposition: A | Discharge status: Home / Self Care | Payer: Medicaid Other | Attending: Emergency Medicine | Admitting: Emergency Medicine

## 2023-02-26 ENCOUNTER — Other Ambulatory Visit: Payer: Self-pay

## 2023-02-26 ENCOUNTER — Encounter (HOSPITAL_COMMUNITY): Payer: Self-pay | Admitting: Emergency Medicine

## 2023-02-26 DIAGNOSIS — R300 Dysuria: Secondary | ICD-10-CM | POA: Diagnosis present

## 2023-02-26 DIAGNOSIS — N342 Other urethritis: Secondary | ICD-10-CM | POA: Diagnosis not present

## 2023-02-26 LAB — URINALYSIS, ROUTINE W REFLEX MICROSCOPIC
Bilirubin Urine: NEGATIVE
Glucose, UA: NEGATIVE mg/dL
Hgb urine dipstick: NEGATIVE
Ketones, ur: NEGATIVE mg/dL
Leukocytes,Ua: NEGATIVE
Nitrite: NEGATIVE
Protein, ur: NEGATIVE mg/dL
Specific Gravity, Urine: 1.028 (ref 1.005–1.030)
pH: 5 (ref 5.0–8.0)

## 2023-02-26 MED ORDER — LIDOCAINE HCL (PF) 1 % IJ SOLN
INTRAMUSCULAR | Status: AC
  Administered 2023-02-26: 1 mL
  Filled 2023-02-26: qty 5

## 2023-02-26 MED ORDER — AZITHROMYCIN 250 MG PO TABS
1000.0000 mg | ORAL_TABLET | Freq: Once | ORAL | Status: AC
  Administered 2023-02-26: 1000 mg via ORAL
  Filled 2023-02-26: qty 4

## 2023-02-26 MED ORDER — CEFTRIAXONE SODIUM 500 MG IJ SOLR
500.0000 mg | Freq: Once | INTRAMUSCULAR | Status: AC
  Administered 2023-02-26: 500 mg via INTRAMUSCULAR
  Filled 2023-02-26: qty 500

## 2023-02-26 NOTE — Discharge Instructions (Signed)
Your test results should be available in the next 1 to 2 days.  If positive, no unprotected sexual activity for 2 weeks, and notify your partners that they can also be treated.

## 2023-02-26 NOTE — ED Triage Notes (Signed)
Pt with c/o "discomfort when urinating". States he has a clear discharge as well.

## 2023-02-26 NOTE — ED Provider Notes (Signed)
  [place] EMERGENCY DEPARTMENT AT [facility] Provider Note   CSN: [id number] Arrival date & time: 02/26/23  0151     History  Chief Complaint  Patient presents with   Dysuria    Tracy [name] is a 27 y.o. male.  Patient is a 27 year old male with no significant past medical history.  Patient presenting with complaints of burning with urination and clear urethral discharge.  This has been worsening all week.  He does report being extremely active with a new partner, but the partner has not described symptoms.  Patient denies abdominal pain.  No fevers or chills.  The history is provided by the patient.       Home Medications Prior to Admission medications   Medication Sig Start Date End Date Taking? Authorizing Provider  albuterol (VENTOLIN HFA) 108 (90 Base) MCG/ACT inhaler Inhale 1 puff into the lungs every 6 (six) hours as needed for wheezing or shortness of breath. 08/20/22   Lamptey, Britta Mccreedy, MD  benzonatate (TESSALON) 100 MG capsule Take 1 capsule (100 mg total) by mouth 3 (three) times daily as needed for cough. 08/20/22   Merrilee Jansky, MD  budesonide-formoterol (SYMBICORT) 80-4.5 MCG/ACT inhaler Inhale 2 puffs into the lungs 2 (two) times daily.    [provider]  ondansetron (ZOFRAN-ODT) 4 MG disintegrating tablet Take 1 tablet (4 mg total) by mouth every 8 (eight) hours as needed for nausea or vomiting. 08/25/22   Carlisle Beers, FNP      Allergies    Penicillins    Review of Systems   Review of Systems  All other systems reviewed and are negative.   Physical Exam Updated Vital Signs BP (!) 172/87 (BP Location: Left Arm)   Pulse 70   Temp 98.6 F (37 C) (Oral)   Resp 18   Ht 6\' 1"  (1.854 m)   Wt 98 kg   SpO2 97%   BMI 28.50 kg/m  Physical Exam Vitals and nursing note reviewed.  Constitutional:      Appearance: Normal appearance.  Pulmonary:     Effort: Pulmonary effort is normal.  Skin:    General: Skin is warm and  dry.  Neurological:     Mental Status: He is alert and oriented to person, place, and time.     ED Results / Procedures / Treatments   Labs (all labs ordered are listed, but only abnormal results are displayed) Labs Reviewed  URINALYSIS, ROUTINE W REFLEX MICROSCOPIC    EKG None  Radiology No results found.  Procedures Procedures    Medications Ordered in ED Medications  cefTRIAXone (ROCEPHIN) injection 500 mg (has no administration in time range)  azithromycin (ZITHROMAX) tablet 1,000 mg (has no administration in time range)    ED Course/ Medical Decision Making/ A&P  Patient presenting with burning with urination and urethral discharge as described in the HPI.  His urinalysis is basically clear.  GC and Chlamydia testing are pending.  Patient has requested presumptive treatment.  He will be given Rocephin and Zithromax and advised to follow-up as needed.  Final Clinical Impression(s) / ED Diagnoses Final diagnoses:  None    Rx / DC Orders ED Discharge Orders     None         Geoffery Lyons, MD 02/26/23 [phone]

## 2023-03-01 LAB — GC/CHLAMYDIA PROBE AMP (~~LOC~~) NOT AT ARMC
Chlamydia: NEGATIVE
Comment: NEGATIVE
Comment: NORMAL
Neisseria Gonorrhea: NEGATIVE

## 2023-03-05 ENCOUNTER — Telehealth: Payer: Self-pay

## 2023-03-05 NOTE — Telephone Encounter (Signed)
LVM - Asking pt to call me back

## 2023-03-21 ENCOUNTER — Encounter (HOSPITAL_COMMUNITY): Payer: Self-pay

## 2023-03-21 ENCOUNTER — Emergency Department (HOSPITAL_COMMUNITY)
Admission: EM | Admit: 2023-03-21 | Discharge: 2023-03-21 | Disposition: A | Discharge status: Home / Self Care | Payer: Medicaid Other | Attending: Emergency Medicine | Admitting: Emergency Medicine

## 2023-03-21 ENCOUNTER — Other Ambulatory Visit: Payer: Self-pay

## 2023-03-21 DIAGNOSIS — F419 Anxiety disorder, unspecified: Secondary | ICD-10-CM | POA: Diagnosis present

## 2023-03-21 DIAGNOSIS — J45909 Unspecified asthma, uncomplicated: Secondary | ICD-10-CM | POA: Diagnosis not present

## 2023-03-21 MED ORDER — SERTRALINE HCL 50 MG PO TABS: 50.0000 mg | ORAL_TABLET | Freq: Every day | ORAL | 1 refills | Status: DC

## 2023-03-21 NOTE — ED Triage Notes (Addendum)
Pt reports waking up and feeling anxious, unable to sleep, and having anxiety attacks x1 week. Pt reports starting new job that is 12 hour shifts prior to this starting.  Pt reports he has been referred to [facility] but hasn't seen them yet.  Denies cp/sob Ambulatory to triage.

## 2023-03-21 NOTE — Discharge Instructions (Signed)
Please go to the clinic tomorrow to discuss your anxiety, I have prescribed a medication called sertraline which may be taken once a day, you need to take it at the same time every day and make sure that you are not missing any doses.  As with any new medication that you are taking there may be side effects and if the side effects are serious or concerning please talk to medicine and call your doctor immediately.  Thank you for allowing [place] to treat you in the emergency department today.  After reviewing your examination and potential testing that was done it appears that you are safe to go home.  I would like for you to follow-up with your doctor within the next several days, have them obtain your records and follow-up with them to review all potential tests and results from your visit.  If you should develop severe or worsening symptoms return to the emergency department immediately
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
## 2023-03-28 ENCOUNTER — Encounter (HOSPITAL_COMMUNITY): Payer: Self-pay | Admitting: Emergency Medicine

## 2023-03-28 ENCOUNTER — Emergency Department (HOSPITAL_COMMUNITY)
Admission: EM | Admit: 2023-03-28 | Discharge: 2023-03-29 | Disposition: A | Discharge status: Home / Self Care | Payer: Medicaid Other | Attending: Emergency Medicine | Admitting: Emergency Medicine

## 2023-03-28 ENCOUNTER — Other Ambulatory Visit: Payer: Self-pay

## 2023-03-28 DIAGNOSIS — R0602 Shortness of breath: Secondary | ICD-10-CM | POA: Insufficient documentation

## 2023-03-28 DIAGNOSIS — F1290 Cannabis use, unspecified, uncomplicated: Secondary | ICD-10-CM | POA: Diagnosis not present

## 2023-03-28 DIAGNOSIS — R000 Tachycardia, unspecified: Secondary | ICD-10-CM | POA: Diagnosis not present

## 2023-03-28 DIAGNOSIS — R002 Palpitations: Secondary | ICD-10-CM | POA: Diagnosis present

## 2023-03-28 NOTE — ED Triage Notes (Signed)
Pt here with c/o palpations after eating an edible approximately prior to arrival. Pt very anxious in triage stating he feels like he is going to pass out, that his chest hurts, and that he cannot breathe. Pt very uncooperative in triage.

## 2023-03-28 NOTE — ED Provider Notes (Signed)
[place] EMERGENCY DEPARTMENT AT [facility] Provider Note   CSN: [id number] Arrival date & time: 03/28/23  2334     History  Chief Complaint  Patient presents with   Palpitations    Tracy [name] is a 27 y.o. male.  Patient is a 26 year old male presenting with complaints of palpitations and shortness of breath.  This started approximately 1 hour prior to presentation after the patient ingested a "edible THC gummy".  Patient states he feels tight in his chest and is having difficulty breathing.  He also feels as though his heart is beating fast.  He denies any nausea, diaphoresis.  No fevers or chills.  He felt fine prior to taking the gummy.  The history is provided by the patient.       Home Medications Prior to Admission medications   Medication Sig Start Date End Date Taking? Authorizing Provider  albuterol (VENTOLIN HFA) 108 (90 Base) MCG/ACT inhaler Inhale 1 puff into the lungs every 6 (six) hours as needed for wheezing or shortness of breath. 08/20/22   Lamptey, Britta Mccreedy, MD  budesonide-formoterol (SYMBICORT) 80-4.5 MCG/ACT inhaler Inhale 2 puffs into the lungs 2 (two) times daily.    [provider]  sertraline (ZOLOFT) 50 MG tablet Take 1 tablet (50 mg total) by mouth daily. 03/21/23   Eber Hong, MD      Allergies    Penicillins    Review of Systems   Review of Systems  All other systems reviewed and are negative.   Physical Exam Updated Vital Signs BP (!) 185/128   Pulse (!) 135   Temp 97.9 F (36.6 C)   Resp (!) 25   Ht 6\' 1"  (1.854 m)   Wt 98 kg   SpO2 99%   BMI 28.50 kg/m  Physical Exam Vitals and nursing note reviewed.  Constitutional:      General: He is not in acute distress.    Appearance: He is well-developed. He is not diaphoretic.  HENT:     Head: Normocephalic and atraumatic.  Cardiovascular:     Rate and Rhythm: Regular rhythm. Tachycardia present.     Heart sounds: No murmur heard.    No friction rub.   Pulmonary:     Effort: Pulmonary effort is normal. No respiratory distress.     Breath sounds: Normal breath sounds. No wheezing or rales.  Abdominal:     General: Bowel sounds are normal. There is no distension.     Palpations: Abdomen is soft.     Tenderness: There is no abdominal tenderness.  Musculoskeletal:        General: Normal range of motion.     Cervical back: Normal range of motion and neck supple.  Skin:    General: Skin is warm and dry.  Neurological:     Mental Status: He is alert and oriented to person, place, and time.     Coordination: Coordination normal.     ED Results / Procedures / Treatments   Labs (all labs ordered are listed, but only abnormal results are displayed) Labs Reviewed - No data to display  EKG None    ED ECG REPORT   Date: 03/28/2023  Rate: 133  Rhythm: sinus tachycardia  QRS Axis: normal  Intervals: normal  ST/T Wave abnormalities: normal  Conduction Disutrbances:none  Narrative Interpretation:   Old EKG Reviewed: none available  I have personally reviewed the EKG tracing and agree with the computerized printout as noted.   Radiology No  results found.  Procedures Procedures  {Document cardiac monitor, telemetry assessment procedure when appropriate:1}  Medications Ordered in ED Medications - No data to display  ED Course/ Medical Decision Making/ A&P   {   Click here for ABCD2, HEART and other calculatorsREFRESH Note before signing :1}                              Medical Decision Making Amount and/or Complexity of Data Reviewed Labs: ordered. Radiology: ordered.   ***  {Document critical care time when appropriate:1} {Document review of labs and clinical decision tools ie heart score, Chads2Vasc2 etc:1}  {Document your independent review of radiology images, and any outside records:1} {Document your discussion with family members, caretakers, and with consultants:1} {Document social determinants of health  affecting pt's care:1} {Document your decision making why or why not admission, treatments were needed:1} Final Clinical Impression(s) / ED Diagnoses Final diagnoses:  None    Rx / DC Orders ED Discharge Orders     None

## 2023-03-29 ENCOUNTER — Emergency Department (HOSPITAL_COMMUNITY): Payer: Medicaid Other

## 2023-03-29 LAB — CBC WITH DIFFERENTIAL/PLATELET
Abs Immature Granulocytes: 0.02 10*3/uL (ref 0.00–0.07)
Basophils Absolute: 0 10*3/uL (ref 0.0–0.1)
Basophils Relative: 1 %
Eosinophils Absolute: 0.2 10*3/uL (ref 0.0–0.5)
Eosinophils Relative: 4 %
HCT: 43.1 % (ref 39.0–52.0)
Hemoglobin: 15.4 g/dL (ref 13.0–17.0)
Immature Granulocytes: 0 %
Lymphocytes Relative: 53 %
Lymphs Abs: 3.3 10*3/uL (ref 0.7–4.0)
MCH: 30.1 pg (ref 26.0–34.0)
MCHC: 35.7 g/dL (ref 30.0–36.0)
MCV: 84.2 fL (ref 80.0–100.0)
Monocytes Absolute: 0.5 10*3/uL (ref 0.1–1.0)
Monocytes Relative: 9 %
Neutro Abs: 2 10*3/uL (ref 1.7–7.7)
Neutrophils Relative %: 33 %
Platelets: 301 10*3/uL (ref 150–400)
RBC: 5.12 MIL/uL (ref 4.22–5.81)
RDW: 12.3 % (ref 11.5–15.5)
WBC: 6.1 10*3/uL (ref 4.0–10.5)
nRBC: 0 % (ref 0.0–0.2)

## 2023-03-29 LAB — TROPONIN I (HIGH SENSITIVITY): Troponin I (High Sensitivity): 8 ng/L (ref <18)

## 2023-03-29 LAB — BRAIN NATRIURETIC PEPTIDE: B Natriuretic Peptide: 6 pg/mL (ref 0.0–100.0)

## 2023-03-29 MED ORDER — ONDANSETRON 8 MG PO TBDP
8.0000 mg | ORAL_TABLET | Freq: Once | ORAL | Status: DC
  Filled 2023-03-29: qty 1

## 2023-03-29 NOTE — ED Notes (Signed)
 Patient pacing around room

## 2023-03-29 NOTE — ED Notes (Signed)
Portable xray at bedside.

## 2023-03-29 NOTE — Discharge Instructions (Signed)
Return to the ER if you develop any new and/or concerning issues.

## 2023-04-11 ENCOUNTER — Emergency Department (HOSPITAL_COMMUNITY)
Admission: EM | Admit: 2023-04-11 | Discharge: 2023-04-12 | Disposition: A | Discharge status: Home / Self Care | Payer: Medicaid Other | Attending: Emergency Medicine | Admitting: Emergency Medicine

## 2023-04-11 ENCOUNTER — Emergency Department (HOSPITAL_COMMUNITY): Payer: Medicaid Other

## 2023-04-11 ENCOUNTER — Encounter (HOSPITAL_COMMUNITY): Payer: Self-pay

## 2023-04-11 ENCOUNTER — Other Ambulatory Visit: Payer: Self-pay

## 2023-04-11 DIAGNOSIS — R1031 Right lower quadrant pain: Secondary | ICD-10-CM | POA: Diagnosis present

## 2023-04-11 DIAGNOSIS — J45909 Unspecified asthma, uncomplicated: Secondary | ICD-10-CM | POA: Insufficient documentation

## 2023-04-11 LAB — CBC WITH DIFFERENTIAL/PLATELET
Abs Immature Granulocytes: 0.01 10*3/uL (ref 0.00–0.07)
Basophils Absolute: 0 10*3/uL (ref 0.0–0.1)
Basophils Relative: 1 %
Eosinophils Absolute: 0.2 10*3/uL (ref 0.0–0.5)
Eosinophils Relative: 5 %
HCT: 46.6 % (ref 39.0–52.0)
Hemoglobin: 16.5 g/dL (ref 13.0–17.0)
Immature Granulocytes: 0 %
Lymphocytes Relative: 50 %
Lymphs Abs: 2.4 10*3/uL (ref 0.7–4.0)
MCH: 30.5 pg (ref 26.0–34.0)
MCHC: 35.4 g/dL (ref 30.0–36.0)
MCV: 86.1 fL (ref 80.0–100.0)
Monocytes Absolute: 0.3 10*3/uL (ref 0.1–1.0)
Monocytes Relative: 7 %
Neutro Abs: 1.8 10*3/uL (ref 1.7–7.7)
Neutrophils Relative %: 37 %
Platelets: 271 10*3/uL (ref 150–400)
RBC: 5.41 MIL/uL (ref 4.22–5.81)
RDW: 12.2 % (ref 11.5–15.5)
WBC: 4.8 10*3/uL (ref 4.0–10.5)
nRBC: 0 % (ref 0.0–0.2)

## 2023-04-11 MED ORDER — KETOROLAC TROMETHAMINE 15 MG/ML IJ SOLN
15.0000 mg | Freq: Once | INTRAMUSCULAR | Status: AC
  Administered 2023-04-12: 15 mg via INTRAVENOUS
  Filled 2023-04-11: qty 1

## 2023-04-11 MED ORDER — OXYCODONE-ACETAMINOPHEN 5-325 MG PO TABS
1.0000 | ORAL_TABLET | Freq: Once | ORAL | Status: AC
  Administered 2023-04-12: 1 via ORAL
  Filled 2023-04-11: qty 1
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
## 2023-04-12 ENCOUNTER — Emergency Department (HOSPITAL_COMMUNITY): Payer: Medicaid Other

## 2023-04-12 LAB — URINALYSIS, ROUTINE W REFLEX MICROSCOPIC
Bilirubin Urine: NEGATIVE
Glucose, UA: NEGATIVE mg/dL
Hgb urine dipstick: NEGATIVE
Ketones, ur: NEGATIVE mg/dL
Leukocytes,Ua: NEGATIVE
Nitrite: NEGATIVE
Protein, ur: NEGATIVE mg/dL
Specific Gravity, Urine: 1.043 — ABNORMAL HIGH (ref 1.005–1.030)
pH: 7 (ref 5.0–8.0)

## 2023-04-12 LAB — LIPASE, BLOOD: Lipase: 44 U/L (ref 11–51)

## 2023-04-12 LAB — COMPREHENSIVE METABOLIC PANEL
ALT: 24 U/L (ref 0–44)
AST: 19 U/L (ref 15–41)
Albumin: 4.2 g/dL (ref 3.5–5.0)
Alkaline Phosphatase: 51 U/L (ref 38–126)
Anion gap: 10 (ref 5–15)
BUN: 18 mg/dL (ref 6–20)
CO2: 27 mmol/L (ref 22–32)
Calcium: 9.2 mg/dL (ref 8.9–10.3)
Chloride: 103 mmol/L (ref 98–111)
Creatinine, Ser: 0.99 mg/dL (ref 0.61–1.24)
GFR, Estimated: >60 mL/min (ref >60)
Glucose, Bld: 88 mg/dL (ref 70–99)
Potassium: 3.9 mmol/L (ref 3.5–5.1)
Sodium: 140 mmol/L (ref 135–145)
Total Bilirubin: 0.5 mg/dL (ref <1.2)
Total Protein: 6.7 g/dL (ref 6.5–8.1)

## 2023-04-12 LAB — MAGNESIUM: Magnesium: 2.2 mg/dL (ref 1.7–2.4)

## 2023-04-12 MED ORDER — IOHEXOL 300 MG/ML  SOLN
100.0000 mL | Freq: Once | INTRAMUSCULAR | Status: AC | PRN
  Administered 2023-04-12: 100 mL via INTRAVENOUS

## 2023-04-12 NOTE — ED Notes (Signed)
Patient transported to CT 

## 2023-04-12 NOTE — Discharge Instructions (Signed)
Test results today are reassuring.  Take ibuprofen and Tylenol as needed for pain.  Return to the emergency department for any new or worsening symptoms of concern.

## 2023-04-29 ENCOUNTER — Emergency Department (HOSPITAL_COMMUNITY)
Admission: EM | Admit: 2023-04-29 | Discharge: 2023-04-29 | Disposition: A | Discharge status: Home / Self Care | Payer: Medicaid Other | Attending: Emergency Medicine | Admitting: Emergency Medicine

## 2023-04-29 ENCOUNTER — Other Ambulatory Visit: Payer: Self-pay

## 2023-04-29 DIAGNOSIS — F419 Anxiety disorder, unspecified: Secondary | ICD-10-CM

## 2023-04-29 DIAGNOSIS — R519 Headache, unspecified: Secondary | ICD-10-CM | POA: Diagnosis present

## 2023-04-29 DIAGNOSIS — F410 Panic disorder [episodic paroxysmal anxiety] without agoraphobia: Secondary | ICD-10-CM | POA: Diagnosis not present

## 2023-04-29 MED ORDER — HYDROXYZINE HCL 25 MG PO TABS: 25.0000 mg | ORAL_TABLET | Freq: Four times a day (QID) | ORAL | 0 refills | Status: DC | PRN

## 2023-04-29 MED ORDER — IBUPROFEN 800 MG PO TABS
800.0000 mg | ORAL_TABLET | Freq: Once | ORAL | Status: AC
  Administered 2023-04-29: 800 mg via ORAL
  Filled 2023-04-29: qty 1

## 2023-04-29 MED ORDER — LORAZEPAM 1 MG PO TABS
1.0000 mg | ORAL_TABLET | Freq: Once | ORAL | Status: AC
  Administered 2023-04-29: 1 mg via ORAL
  Filled 2023-04-29: qty 1

## 2023-04-29 NOTE — ED Notes (Signed)
ED Provider at bedside. 

## 2023-04-29 NOTE — ED Provider Notes (Signed)
EMERGENCY DEPARTMENT AT [facility] Provider Note   CSN: [id number] Arrival date & time: 04/29/23  0135     History  Chief Complaint  Patient presents with   Headache    Tracy [name] is a 27 y.o. male.  Patient presents to the emergency department for evaluation of multiple problems.  Patient reports that he has a history of anxiety that has been ongoing.  He has been seen in the ED for this previously but has not been able to afford the medications that were prescribed.  When his anxiety occurs he sometimes gets dizzy, headaches.  He also feels like his anxiety is related to when he has GERD, but did not have any symptoms of reflux tonight.       Home Medications Prior to Admission medications   Medication Sig Start Date End Date Taking? Authorizing Provider  hydrOXYzine (ATARAX) 25 MG tablet Take 1 tablet (25 mg total) by mouth every 6 (six) hours as needed for anxiety. 04/29/23  Yes Asante Blanda, Canary Brim, MD  albuterol (VENTOLIN HFA) 108 (90 Base) MCG/ACT inhaler Inhale 1 puff into the lungs every 6 (six) hours as needed for wheezing or shortness of breath. 08/20/22   Lamptey, Britta Mccreedy, MD  budesonide-formoterol (SYMBICORT) 80-4.5 MCG/ACT inhaler Inhale 2 puffs into the lungs 2 (two) times daily.    [provider]  sertraline (ZOLOFT) 50 MG tablet Take 1 tablet (50 mg total) by mouth daily. 03/21/23   Eber Hong, MD      Allergies    Penicillins    Review of Systems   Review of Systems  Physical Exam Updated Vital Signs BP (!) 140/100 (BP Location: Left Arm)   Pulse 63   Temp 98.2 F (36.8 C) (Oral)   Resp 18   Ht 6\' 1"  (1.854 m)   Wt 95.3 kg   SpO2 97%   BMI 27.71 kg/m  Physical Exam Vitals and nursing note reviewed.  Constitutional:      General: He is not in acute distress.    Appearance: He is well-developed.  HENT:     Head: Normocephalic and atraumatic.     Mouth/Throat:     Mouth: Mucous membranes are moist.   Eyes:     General: Vision grossly intact. Gaze aligned appropriately.     Extraocular Movements: Extraocular movements intact.     Conjunctiva/sclera: Conjunctivae normal.  Cardiovascular:     Rate and Rhythm: Normal rate and regular rhythm.     Pulses: Normal pulses.     Heart sounds: Normal heart sounds, S1 normal and S2 normal. No murmur heard.    No friction rub. No gallop.  Pulmonary:     Effort: Pulmonary effort is normal. No respiratory distress.     Breath sounds: Normal breath sounds.  Abdominal:     Palpations: Abdomen is soft.     Tenderness: There is no abdominal tenderness. There is no guarding or rebound.     Hernia: No hernia is present.  Musculoskeletal:        General: No swelling.     Cervical back: Full passive range of motion without pain, normal range of motion and neck supple. No pain with movement, spinous process tenderness or muscular tenderness. Normal range of motion.     Right lower leg: No edema.     Left lower leg: No edema.  Skin:    General: Skin is warm and dry.     Capillary Refill: Capillary refill takes  less than 2 seconds.     Findings: No ecchymosis, erythema, lesion or wound.  Neurological:     Mental Status: He is alert and oriented to person, place, and time.     GCS: GCS eye subscore is 4. GCS verbal subscore is 5. GCS motor subscore is 6.     Cranial Nerves: Cranial nerves 2-12 are intact.     Sensory: Sensation is intact.     Motor: Motor function is intact. No weakness or abnormal muscle tone.     Coordination: Coordination is intact.     Comments: Extraocular muscle movement: normal No visual field cut Pupils: equal and reactive both direct and consensual response is normal No nystagmus present    Sensory function is intact to light touch, pinprick Proprioception intact  Grip strength 5/5 symmetric in upper extremities No pronator drift Normal finger to nose bilaterally  Lower extremity strength 5/5 against gravity Normal  heel to shin bilaterally  Gait: normal   Psychiatric:        Mood and Affect: Mood is anxious.        Speech: Speech normal.        Behavior: Behavior normal.     ED Results / Procedures / Treatments   Labs (all labs ordered are listed, but only abnormal results are displayed) Labs Reviewed - No data to display  EKG None  Radiology No results found.  Procedures Procedures    Medications Ordered in ED Medications  ibuprofen (ADVIL) tablet 800 mg (800 mg Oral Given 04/29/23 0216)  LORazepam (ATIVAN) tablet 1 mg (1 mg Oral Given 04/29/23 0216)    ED Course/ Medical Decision Making/ A&P                                 Medical Decision Making Risk Prescription drug management.   Presents with multiple symptoms.  Patient does seem very anxious.  He reports throbbing headache tonight, but also reports that he often gets this when he has his anxiety and when his anxiety resolves the headache resolves.  Neurologic exam is completely normal.  No red flags.  Doubt intracranial abnormality.  Patient treated with ibuprofen and Ativan.  Patient with multiple negative emergency department workups over the last month.  I do not feel that blood work or imaging would be helpful.  Vistaril prescribed as needed for his anxiety, given resources for outpatient follow-up for anxiety and psychiatric care.  He was prescribed Zoloft at a prior visit as well which presumably is still at the pharmacy.  I recommended he start taking this as well.        Final Clinical Impression(s) / ED Diagnoses Final diagnoses:  Anxiety  Panic attack    Rx / DC Orders ED Discharge Orders          Ordered    hydrOXYzine (ATARAX) 25 MG tablet  Every 6 hours PRN        04/29/23 0300              Gilda Crease, MD 04/29/23 0302

## 2023-04-29 NOTE — ED Triage Notes (Signed)
Patient from home for headache that started about ago. Reports he started with anxiety and the headache followed. Took tylenol at home with no relief. Pain is all over and described as a pressure. Also reports feeling light headed. Reports some light and sound sensitivity. Denies N/V. Upon arrival to ER, patient is alert and oriented, ambu

## 2023-04-29 NOTE — Discharge Instructions (Signed)
Fill the previous prescription for sertraline (Zoloft) that is at the pharmacy.  I have given you a prescription for a medicine that you can use as needed if you feel any anxiety coming on.  Please schedule follow-up as an outpatient for help with your anxiety.

## 2023-05-07 ENCOUNTER — Encounter (HOSPITAL_COMMUNITY): Payer: Self-pay

## 2023-05-07 ENCOUNTER — Other Ambulatory Visit: Payer: Self-pay

## 2023-05-07 ENCOUNTER — Emergency Department (HOSPITAL_COMMUNITY)
Admission: EM | Admit: 2023-05-07 | Discharge: 2023-05-07 | Disposition: A | Discharge status: Home / Self Care | Payer: Medicaid Other | Attending: Emergency Medicine | Admitting: Emergency Medicine

## 2023-05-07 DIAGNOSIS — F410 Panic disorder [episodic paroxysmal anxiety] without agoraphobia: Secondary | ICD-10-CM | POA: Insufficient documentation

## 2023-05-07 DIAGNOSIS — F419 Anxiety disorder, unspecified: Secondary | ICD-10-CM

## 2023-05-07 MED ORDER — HYDROXYZINE HCL 25 MG PO TABS: 25.0000 mg | ORAL_TABLET | Freq: Four times a day (QID) | ORAL | 0 refills | Status: DC

## 2023-05-07 MED ORDER — LORAZEPAM 1 MG PO TABS
1.0000 mg | ORAL_TABLET | Freq: Once | ORAL | Status: AC
  Administered 2023-05-07: 1 mg via ORAL
  Filled 2023-05-07: qty 1

## 2023-05-07 NOTE — ED Provider Notes (Signed)
[place] EMERGENCY DEPARTMENT AT [facility] Provider Note   CSN: [id number] Arrival date & time: 05/07/23  7253     History  Chief Complaint  Patient presents with   Panic Attack    Tracy [name] is a 27 y.o. male.  Patient is a 27 year old male presenting with complaints of anxiety.  He reports eating spicy food prior to arrival that gave him some GI upset.  He describes some burning in his chest, then became anxious and felt short of breath.  He reports having an episode of vomiting shortly after arriving here which has relieved his symptoms somewhat.  Patient has been seen here on multiple occasions with similar complaints.  The history is provided by the patient.       Home Medications Prior to Admission medications   Medication Sig Start Date End Date Taking? Authorizing Provider  albuterol (VENTOLIN HFA) 108 (90 Base) MCG/ACT inhaler Inhale 1 puff into the lungs every 6 (six) hours as needed for wheezing or shortness of breath. 08/20/22   Lamptey, Britta Mccreedy, MD  budesonide-formoterol (SYMBICORT) 80-4.5 MCG/ACT inhaler Inhale 2 puffs into the lungs 2 (two) times daily.    [provider]  hydrOXYzine (ATARAX) 25 MG tablet Take 1 tablet (25 mg total) by mouth every 6 (six) hours as needed for anxiety. 04/29/23   Gilda Crease, MD  sertraline (ZOLOFT) 50 MG tablet Take 1 tablet (50 mg total) by mouth daily. 03/21/23   Eber Hong, MD      Allergies    Penicillins    Review of Systems   Review of Systems  All other systems reviewed and are negative.   Physical Exam Updated Vital Signs BP (!) 163/98 (BP Location: Right Arm)   Pulse 91   Temp 98.5 F (36.9 C) (Oral)   Resp 16   Ht 6\' 1"  (1.854 m)   Wt 95.2 kg   SpO2 95%   BMI 27.70 kg/m  Physical Exam Vitals and nursing note reviewed.  Constitutional:      General: He is not in acute distress.    Appearance: He is well-developed. He is not diaphoretic.  HENT:     Head:  Normocephalic and atraumatic.  Cardiovascular:     Rate and Rhythm: Normal rate and regular rhythm.     Heart sounds: No murmur heard.    No friction rub.  Pulmonary:     Effort: Pulmonary effort is normal. No respiratory distress.     Breath sounds: Normal breath sounds. No wheezing or rales.  Abdominal:     General: Bowel sounds are normal. There is no distension.     Palpations: Abdomen is soft.     Tenderness: There is no abdominal tenderness.  Musculoskeletal:        General: Normal range of motion.     Cervical back: Normal range of motion and neck supple.  Skin:    General: Skin is warm and dry.  Neurological:     Mental Status: He is alert and oriented to person, place, and time.     Coordination: Coordination normal.     ED Results / Procedures / Treatments   Labs (all labs ordered are listed, but only abnormal results are displayed) Labs Reviewed - No data to display  EKG None  Radiology No results found.  Procedures Procedures    Medications Ordered in ED Medications  LORazepam (ATIVAN) tablet 1 mg (has no administration in time range)    ED Course/  Medical Decision Making/ A&P  Patient presenting with anxiety like symptoms as described in the HPI.  Patient arrives here with stable vital signs and is afebrile.  Physical examination basically unremarkable.  I will give the patient a milligram of Ativan and discharge him with hydroxyzine.  He has had multiple workups into this in the past and I do not feel as though any laboratory studies or testing is indicated.  Patient to follow-up with primary doctor to discuss possibly starting anxiety medications.  Final Clinical Impression(s) / ED Diagnoses Final diagnoses:  None    Rx / DC Orders ED Discharge Orders     None         Geoffery Lyons, MD 05/07/23 [phone]

## 2023-05-07 NOTE — Discharge Instructions (Signed)
Begin taking hydroxyzine as prescribed as needed for anxiety.  Follow-up with your primary doctor to discuss your situation.

## 2023-05-07 NOTE — ED Triage Notes (Signed)
Pt c/o SOB increasing with panic attacks. Pt states being here for same issues previously. Pt states this episode started about 20 minutes ago.

## 2023-05-09 ENCOUNTER — Encounter (HOSPITAL_COMMUNITY): Payer: Self-pay

## 2023-05-09 ENCOUNTER — Other Ambulatory Visit: Payer: Self-pay

## 2023-05-09 ENCOUNTER — Emergency Department (HOSPITAL_COMMUNITY)
Admission: EM | Admit: 2023-05-09 | Discharge: 2023-05-09 | Disposition: A | Discharge status: Home / Self Care | Payer: Medicaid Other | Attending: Emergency Medicine | Admitting: Emergency Medicine

## 2023-05-09 DIAGNOSIS — R519 Headache, unspecified: Secondary | ICD-10-CM | POA: Insufficient documentation

## 2023-05-09 DIAGNOSIS — F419 Anxiety disorder, unspecified: Secondary | ICD-10-CM | POA: Insufficient documentation

## 2023-05-09 MED ORDER — ACETAMINOPHEN 325 MG PO TABS
650.0000 mg | ORAL_TABLET | Freq: Once | ORAL | Status: AC
Start: 2023-05-09 — End: 2023-05-09
  Administered 2023-05-09: 650 mg via ORAL
  Filled 2023-05-09: qty 2

## 2023-05-09 NOTE — ED Provider Notes (Signed)
[name] EMERGENCY DEPARTMENT AT [facility]  Provider Note  CSN: [id number] Arrival date & time: 05/09/23 4098  History Chief Complaint  Patient presents with   Headache    Tracy [name] is a 27 y.o. male with history of anxiety presents for evaluation of headache and anxiety. States he woke up around 1am with a 'weird headache' on the top of his head and feeling anxious. He is feeling better now, sleeping soundly on my evaluation. He has been in the ED for similar multiple times in recent weeks. Referred to [facility] and is awaiting his second appointment. He has Rx for vistaril which he took yesterday afternoon with improvement. Did not take anything tonight.    Home Medications Prior to Admission medications   Medication Sig Start Date End Date Taking? Authorizing Provider  hydrOXYzine (ATARAX) 25 MG tablet Take 1 tablet (25 mg total) by mouth every 6 (six) hours. 05/07/23  Yes Delo, Riley Lam, MD  albuterol (VENTOLIN HFA) 108 (90 Base) MCG/ACT inhaler Inhale 1 puff into the lungs every 6 (six) hours as needed for wheezing or shortness of breath. 08/20/22   Lamptey, Britta Mccreedy, MD  budesonide-formoterol (SYMBICORT) 80-4.5 MCG/ACT inhaler Inhale 2 puffs into the lungs 2 (two) times daily.    [provider]  sertraline (ZOLOFT) 50 MG tablet Take 1 tablet (50 mg total) by mouth daily. 03/21/23   Eber Hong, MD     Allergies    Penicillins   Review of Systems   Review of Systems Please see HPI for pertinent positives and negatives  Physical Exam BP 137/82 (BP Location: Left Arm)   Pulse 87   Temp 98.3 F (36.8 C) (Oral)   Resp 18   Ht 6\' 1"  (1.854 m)   Wt 95.2 kg   SpO2 98%   BMI 27.69 kg/m   Physical Exam Vitals and nursing note reviewed.  HENT:     Head: Normocephalic.     Nose: Nose normal.  Eyes:     Extraocular Movements: Extraocular movements intact.  Pulmonary:     Effort: Pulmonary effort is normal.  Musculoskeletal:        General:  Normal range of motion.     Cervical back: Neck supple.  Skin:    Findings: No rash (on exposed skin).  Neurological:     Mental Status: He is alert and oriented to person, place, and time.  Psychiatric:        Mood and Affect: Mood normal.     ED Results / Procedures / Treatments   EKG None  Procedures Procedures  Medications Ordered in the ED Medications  acetaminophen (TYLENOL) tablet 650 mg (has no administration in time range)    Initial Impression and Plan  Patient here with nonspecific headache, no concerns for acute life threatening cause. Also feeling anxious which is not unusual for him. Will give APAP for the headache, he has vistaril he can take for anxiety. Recommend he continue with outpatient management.   ED Course       MDM Rules/Calculators/A&P Medical Decision Making Problems Addressed: Acute nonintractable headache, unspecified headache type: acute illness or injury Anxiety: chronic illness or injury with exacerbation, progression, or side effects of treatment  Risk OTC drugs. Prescription drug management.     Final Clinical Impression(s) / ED Diagnoses Final diagnoses:  Acute nonintractable headache, unspecified headache type  Anxiety    Rx / DC Orders ED Discharge Orders     None  Pollyann Savoy, MD 05/09/23 [phone]

## 2023-05-09 NOTE — ED Triage Notes (Signed)
Pt arrived via POV from home c/o anterior headache Pt believes could be related to anxiety and stress in combination sleep deprivation.

## 2023-05-13 ENCOUNTER — Encounter (HOSPITAL_COMMUNITY): Payer: Self-pay

## 2023-05-13 ENCOUNTER — Encounter (HOSPITAL_COMMUNITY): Payer: Self-pay | Admitting: Emergency Medicine

## 2023-05-13 ENCOUNTER — Other Ambulatory Visit: Payer: Self-pay

## 2023-05-13 ENCOUNTER — Emergency Department (HOSPITAL_COMMUNITY)
Admission: EM | Admit: 2023-05-13 | Discharge: 2023-05-13 | Disposition: A | Discharge status: Home / Self Care | Payer: Medicaid Other | Attending: Emergency Medicine | Admitting: Emergency Medicine

## 2023-05-13 DIAGNOSIS — R7309 Other abnormal glucose: Secondary | ICD-10-CM | POA: Diagnosis not present

## 2023-05-13 DIAGNOSIS — R002 Palpitations: Secondary | ICD-10-CM | POA: Insufficient documentation

## 2023-05-13 DIAGNOSIS — G43809 Other migraine, not intractable, without status migrainosus: Secondary | ICD-10-CM | POA: Insufficient documentation

## 2023-05-13 DIAGNOSIS — R519 Headache, unspecified: Secondary | ICD-10-CM | POA: Diagnosis present

## 2023-05-13 DIAGNOSIS — J45909 Unspecified asthma, uncomplicated: Secondary | ICD-10-CM | POA: Insufficient documentation

## 2023-05-13 DIAGNOSIS — D72819 Decreased white blood cell count, unspecified: Secondary | ICD-10-CM | POA: Insufficient documentation

## 2023-05-13 DIAGNOSIS — R739 Hyperglycemia, unspecified: Secondary | ICD-10-CM

## 2023-05-13 LAB — CBC WITH DIFFERENTIAL/PLATELET
Abs Immature Granulocytes: 0.01 10*3/uL (ref 0.00–0.07)
Basophils Absolute: 0 10*3/uL (ref 0.0–0.1)
Basophils Relative: 1 %
Eosinophils Absolute: 0.2 10*3/uL (ref 0.0–0.5)
Eosinophils Relative: 5 %
HCT: 43.7 % (ref 39.0–52.0)
Hemoglobin: 15.7 g/dL (ref 13.0–17.0)
Immature Granulocytes: 0 %
Lymphocytes Relative: 56 %
Lymphs Abs: 2.1 10*3/uL (ref 0.7–4.0)
MCH: 30.1 pg (ref 26.0–34.0)
MCHC: 35.9 g/dL (ref 30.0–36.0)
MCV: 83.9 fL (ref 80.0–100.0)
Monocytes Absolute: 0.3 10*3/uL (ref 0.1–1.0)
Monocytes Relative: 7 %
Neutro Abs: 1.2 10*3/uL — ABNORMAL LOW (ref 1.7–7.7)
Neutrophils Relative %: 31 %
Platelets: 252 10*3/uL (ref 150–400)
RBC: 5.21 MIL/uL (ref 4.22–5.81)
RDW: 11.9 % (ref 11.5–15.5)
WBC: 3.8 10*3/uL — ABNORMAL LOW (ref 4.0–10.5)
nRBC: 0 % (ref 0.0–0.2)

## 2023-05-13 LAB — BASIC METABOLIC PANEL
Anion gap: 7 (ref 5–15)
BUN: 13 mg/dL (ref 6–20)
CO2: 26 mmol/L (ref 22–32)
Calcium: 9.3 mg/dL (ref 8.9–10.3)
Chloride: 106 mmol/L (ref 98–111)
Creatinine, Ser: 1 mg/dL (ref 0.61–1.24)
GFR, Estimated: >60 mL/min (ref >60)
Glucose, Bld: 125 mg/dL — ABNORMAL HIGH (ref 70–99)
Potassium: 3.6 mmol/L (ref 3.5–5.1)
Sodium: 139 mmol/L (ref 135–145)

## 2023-05-13 LAB — MAGNESIUM: Magnesium: 2 mg/dL (ref 1.7–2.4)

## 2023-05-13 MED ORDER — POTASSIUM CHLORIDE CRYS ER 20 MEQ PO TBCR
40.0000 meq | EXTENDED_RELEASE_TABLET | Freq: Once | ORAL | Status: AC
  Administered 2023-05-13: 40 meq via ORAL
  Filled 2023-05-13: qty 2

## 2023-05-13 MED ORDER — POTASSIUM CHLORIDE CRYS ER 20 MEQ PO TBCR: 20.0000 meq | EXTENDED_RELEASE_TABLET | Freq: Two times a day (BID) | ORAL | 0 refills | Status: DC

## 2023-05-13 MED ORDER — ACETAMINOPHEN 325 MG PO TABS
650.0000 mg | ORAL_TABLET | Freq: Once | ORAL | Status: AC
  Administered 2023-05-13: 650 mg via ORAL

## 2023-05-13 MED ORDER — ACETAMINOPHEN 500 MG PO TABS
1000.0000 mg | ORAL_TABLET | Freq: Once | ORAL | Status: AC
  Administered 2023-05-13: 1000 mg via ORAL
  Filled 2023-05-13: qty 2

## 2023-05-13 MED ORDER — IBUPROFEN 400 MG PO TABS
400.0000 mg | ORAL_TABLET | Freq: Once | ORAL | Status: AC
  Administered 2023-05-13: 400 mg via ORAL
  Filled 2023-05-13: qty 1

## 2023-05-13 MED ORDER — ACETAMINOPHEN 500 MG PO TABS: 1000.0000 mg | ORAL_TABLET | Freq: Once | ORAL | Status: DC

## 2023-05-13 NOTE — ED Triage Notes (Addendum)
Pt with c/o "weird feeling in his chest" and pressure in his head. Pt very anxious, pacing floor.

## 2023-05-13 NOTE — ED Triage Notes (Addendum)
Pt to ED c/o HA x 2 days, evaluated for it yesterday at [facility]. Hx anxiety, Anxious in triage. Reports anxious because was reading his discharge papers from yesterday.   Pt reports evaluated for the same multiple times

## 2023-05-13 NOTE — Discharge Instructions (Signed)
If the headache comes back, you may take ibuprofen and/or acetaminophen as needed.  You may also try applying ice to the area where you feel the pressure.  Please follow-up with a cardiologist to evaluate your heart rhythm.

## 2023-05-13 NOTE — Discharge Instructions (Signed)
Return to the emergency department if you get severe headaches, problems with your speech, weakness on one side or the other, heart racing that does not get better on its own, chest pain, or shortness of breath.

## 2023-05-13 NOTE — ED Provider Notes (Signed)
[place] EMERGENCY DEPARTMENT AT [facility] Provider Note   CSN: [id number] Arrival date & time: 05/13/23  0130     History  Chief Complaint  Patient presents with   Multiple Complaints    Tracy [name] is a 27 y.o. male.  The history is provided by the patient.  He has history of anxiety, asthma and comes in because of a pressure feeling on the right side of his head which started tonight.  He also felt like his heart was racing and he checked his Apple Watch and it said that his heart rate was 180.  He denies chest pain, heaviness, tightness, pressure.  He denies photophobia or phonophobia.  He denies nausea or vomiting.  He was in the emergency department 2 days ago with similar pressure feeling in his head which did improve following acetaminophen.  Of note, he had been prescribed hydroxyzine for his anxiety and he has been taking it but it has not seemed to think that it has been helping him.   Home Medications Prior to Admission medications   Medication Sig Start Date End Date Taking? Authorizing Provider  albuterol (VENTOLIN HFA) 108 (90 Base) MCG/ACT inhaler Inhale 1 puff into the lungs every 6 (six) hours as needed for wheezing or shortness of breath. 08/20/22   Lamptey, Britta Mccreedy, MD  budesonide-formoterol (SYMBICORT) 80-4.5 MCG/ACT inhaler Inhale 2 puffs into the lungs 2 (two) times daily.    [provider]  hydrOXYzine (ATARAX) 25 MG tablet Take 1 tablet (25 mg total) by mouth every 6 (six) hours. 05/07/23   Geoffery Lyons, MD  sertraline (ZOLOFT) 50 MG tablet Take 1 tablet (50 mg total) by mouth daily. 03/21/23   Eber Hong, MD      Allergies    Penicillins    Review of Systems   Review of Systems  All other systems reviewed and are negative.   Physical Exam Updated Vital Signs BP (!) 168/96 (BP Location: Left Arm)   Pulse 84   Temp 98.7 F (37.1 C) (Oral)   Resp 20   Ht 6\' 1"  (1.854 m)   Wt 95.3 kg   SpO2 100%   BMI 27.71 kg/m   Physical Exam Vitals and nursing note reviewed.   27 year old male, resting comfortably and in no acute distress. Vital signs are significant for elevated blood pressure. Oxygen saturation is 100%, which is normal.  He is quite animated, but does not appear overly anxious. Head is normocephalic and atraumatic. PERRLA, EOMI. Oropharynx is clear. Neck is nontender and supple without adenopathy. Lungs are clear without rales, wheezes, or rhonchi. Chest is nontender. Heart has regular rate and rhythm without murmur. Abdomen is soft, flat, nontender. Extremities have no cyanosis or edema, full range of motion is present. Skin is warm and dry without rash. Neurologic: Mental status is normal, cranial nerves are intact, moves all extremities equally.  ED Results / Procedures / Treatments   Labs (all labs ordered are listed, but only abnormal results are displayed) Labs Reviewed  BASIC METABOLIC PANEL - Abnormal; Notable for the following components:      Result Value   Glucose, Bld 125 (*)    All other components within normal limits  CBC WITH DIFFERENTIAL/PLATELET - Abnormal; Notable for the following components:   WBC 3.8 (*)    Neutro Abs 1.2 (*)    All other components within normal limits  MAGNESIUM    EKG EKG Interpretation Date/Time:  Thursday May 13 2023  01:40:49 EST Ventricular Rate:  85 PR Interval:  53 QRS Duration:  79 QT Interval:  354 QTC Calculation: 426 R Axis:   116  Text Interpretation: Sinus rhythm Short PR interval Left atrial enlargement Probable lateral infarct, age indeterminate When compared with ECG of 03/21/2023, T wave abnormality is no longer present Confirmed by Dione Booze ([id number]) on 05/13/2023 2:35:49 AM  Procedures Procedures  Cardiac monitor shows normal sinus rhythm, per my interpretation.  Medications Ordered in ED Medications  potassium chloride SA (KLOR-CON M) CR tablet 40 mEq (has no administration in time range)  acetaminophen  (TYLENOL) tablet 650 mg (650 mg Oral Given 05/13/23 0215)    ED Course/ Medical Decision Making/ A&P                                 Medical Decision Making Amount and/or Complexity of Data Reviewed Labs: ordered.  Risk OTC drugs. Prescription drug management.   A sense of pressure feeling in his head which seems most consistent with muscle contraction headache, no red flags to suggest more serious pathology such as subarachnoid hemorrhage or meningitis.  However, I am concerned about his Apple Watch reporting heart rate elevated.  He showed the heart rate monitor summary and it actually shows that his maximum heart rate was 164.  I have reviewed his electrocardiogram, my interpretation is sinus rhythm without ST or T changes, prior T wave abnormality resolved.  Because of the question of actual tachycardia, I have ordered screening labs of CBC and basic metabolic panel and magnesium.  I have also ordered a dose of acetaminophen.  I have reviewed his old records and note ED visit on 05/09/2023 for headache described as head pressure, cleared with acetaminophen.  Also several recent ED visits for anxiety and when ED visit for palpitations related to use of CBD edibles.    I have reviewed his laboratory test, and my interpretation is elevated random glucose level which will need to be followed as an outpatient, normal potassium although in the low end of normal, normal magnesium level, leukopenia which had been present previously and possibly related to occult viral infection.  Given 2 episodes of palpitations with at least 1 confirmed by Apple Watch, I feel he would benefit from cardiology evaluation and possible outpatient cardiac monitor.  He is also requesting mental health referral and I have given him resources page with mental health resources.  I am discharge him with a prescription for a short course of oral potassium with a goal of getting the level above 4.0.  Final Clinical Impression(s)  / ED Diagnoses Final diagnoses:  Acute nonintractable headache, unspecified headache type  Palpitations  Elevated random blood glucose level  Leukopenia, unspecified type    Rx / DC Orders ED Discharge Orders          Ordered    potassium chloride SA (KLOR-CON M) 20 MEQ tablet  2 times daily        05/13/23 0239    Ambulatory referral to Cardiology       Comments: If you have not heard from the Cardiology office within the next 72 hours please call [phone].   05/13/23 0240              Dione Booze, MD 05/13/23 [phone]

## 2023-05-13 NOTE — ED Provider Notes (Incomplete)
[place] EMERGENCY DEPARTMENT AT [facility] Provider Note   CSN: [id number] Arrival date & time: 05/13/23  1221     History {Add pertinent medical, surgical, social history, OB history to HPI:1} Chief Complaint  Patient presents with  . Migraine  . Anxiety    Tracy [name] is a 27 y.o. male.  26 year old male with no significant past medical history presents here for right-sided headache and episode of tachycardia that occurred earlier today.  Patient was seen for headache overnight last night.  He had laboratory workup performed, which was notable only for potassium that was on the lower end of normal.  Patient was discharged with potassium supplementation.  Patient [place] [name] presents as he had an additional episode of the headache that he was seen for previously.  He describes as a pressure sensation that is located primarily on the right, parietal portion of his head.  He also endorses feeling a pressure behind his right eye.  He denies photophobia.  Although, endorses some phonophobia with this.  Did have some mild nausea with it.  No vomiting.  He also had an additional episode of heart racing.  Heart rate as high as 170s on his Apple Watch.  He denies feeling chest pain, shortness of breath.  He does endorse having some palpitations with this.  Patient symptoms resolved spontaneously.  He denies any recent illnesses.  Was given a referral to cardiology at his visit earlier today.  That appointment is tomorrow morning.  Patient was also recently prescribed hydroxyzine for anxiety.  He states that he has been taking 25 mg 2-3 times daily to help with his anxiety.  He does not currently have a PCP.  The history is provided by the patient and medical records.       Home Medications Prior to Admission medications   Medication Sig Start Date End Date Taking? Authorizing Provider  albuterol (VENTOLIN HFA) 108 (90 Base) MCG/ACT inhaler Inhale 1 puff into the lungs every 6 (six)  hours as needed for wheezing or shortness of breath. 08/20/22   Lamptey, Britta Mccreedy, MD  budesonide-formoterol (SYMBICORT) 80-4.5 MCG/ACT inhaler Inhale 2 puffs into the lungs 2 (two) times daily.    [provider]  hydrOXYzine (ATARAX) 25 MG tablet Take 1 tablet (25 mg total) by mouth every 6 (six) hours. 05/07/23   Geoffery Lyons, MD  potassium chloride SA (KLOR-CON M) 20 MEQ tablet Take 1 tablet (20 mEq total) by mouth 2 (two) times daily. 05/13/23   Dione Booze, MD  sertraline (ZOLOFT) 50 MG tablet Take 1 tablet (50 mg total) by mouth daily. 03/21/23   Eber Hong, MD      Allergies    Penicillins    Review of Systems   As noted in HPI  Physical Exam Updated Vital Signs BP 115/80   Pulse (!) 53   Temp 98.8 F (37.1 C)   Resp 17   Ht 6\' 1"  (1.854 m)   Wt 95.3 kg   SpO2 100%   BMI 27.71 kg/m  Physical Exam Vitals reviewed.  Constitutional:      General: He is not in acute distress.    Appearance: Normal appearance. He is normal weight. He is not ill-appearing, toxic-appearing or diaphoretic.  HENT:     Head: Normocephalic and atraumatic.     Nose: Nose normal.     Mouth/Throat:     Mouth: Mucous membranes are moist.  Eyes:     General: No visual field deficit.  Extraocular Movements: Extraocular movements intact.     Conjunctiva/sclera: Conjunctivae normal.     Pupils: Pupils are equal, round, and reactive to light.  Cardiovascular:     Rate and Rhythm: Normal rate and regular rhythm.     Pulses: Normal pulses.          Radial pulses are 2+ on the right side and 2+ on the left side.       Dorsalis pedis pulses are 2+ on the right side.     Heart sounds: Normal heart sounds. No murmur heard.    No friction rub. No gallop.  Pulmonary:     Effort: Pulmonary effort is normal. No respiratory distress.     Breath sounds: Normal breath sounds. No wheezing, rhonchi or rales.  Abdominal:     General: There is no distension.     Palpations: Abdomen is soft.      Tenderness: There is no abdominal tenderness. There is no guarding or rebound.  Musculoskeletal:     Right lower leg: No edema.     Left lower leg: No edema.  Skin:    General: Skin is warm and dry.  Neurological:     Mental Status: He is alert.     GCS: GCS eye subscore is 4. GCS verbal subscore is 5. GCS motor subscore is 6.     Cranial Nerves: Cranial nerves 2-12 are intact. No cranial nerve deficit, dysarthria or facial asymmetry.     Sensory: Sensation is intact. No sensory deficit.     Motor: Motor function is intact. No weakness, tremor, abnormal muscle tone or pronator drift.     ED Results / Procedures / Treatments   Labs (all labs ordered are listed, but only abnormal results are displayed) Labs Reviewed - No data to display  EKG EKG Interpretation Date/Time:  Thursday May 13 2023 17:54:43 EST Ventricular Rate:  73 PR Interval:  150 QRS Duration:  94 QT Interval:  377 QTC Calculation: 416 R Axis:   61  Text Interpretation: Sinus rhythm No significant change since last tracing Confirmed by Richardean Canal [phone]) on 05/13/2023 6:00:27 PM  Radiology No results found.  Procedures Procedures  {Document cardiac monitor, telemetry assessment procedure when appropriate:1}  Medications Ordered in ED Medications  acetaminophen (TYLENOL) tablet 1,000 mg (1,000 mg Oral Given 05/13/23 1254)  ibuprofen (ADVIL) tablet 400 mg (400 mg Oral Given 05/13/23 1756)    ED Course/ Medical Decision Making/ A&P Clinical Course as of 05/13/23 2356  Thu May 13, 2023  1812 EKG 12-Lead Sinus rhythm.  Rate of 73.  Normal intervals.  No axis deviation.  T wave inversion in lead III.  Biphasic T wave noted in V5-V6.  No ST segment changes.  When compared to prior ECG from October 2024, no changes noted. [JR]    Clinical Course User Index [JR] Rolla Flatten, MD   {   Click here for ABCD2, HEART and other calculatorsREFRESH Note before signing :1}                               Medical Decision Making Amount and/or Complexity of Data Reviewed ECG/medicine tests:  Decision-making details documented in ED Course.  Risk Prescription drug management.   27 year old male presents here for repeat evaluation of headache and palpitations.  Was seen overnight for the same symptoms.  Vital stable on arrival.  On exam, patient is nontoxic-appearing and in no acute distress.  Cardiopulmonary exam is unremarkable.,  Notably patient's heart rate is well-controlled with pulse rate in the 50s.  Neurologic exam without deficits.  Reviewed the patient's laboratory workup from his most recent visit, which includes BMP, magnesium, CBC.  Overall, no abnormalities.  Although, potassium was 3.6, which is on the low end of normal.  Therefore, patient was discharged with potassium supplements.  Per the previous provider's note, they were concerned with potential arrhythmia.  Therefore, cardiology follow-up was arranged for patient.  He has an appointment tomorrow morning.  I reviewed the patient's ECG from his prior visit, which does not demonstrate any ischemic findings.  He is not having chest pain or shortness of breath.  Will plan to repeat the ECG now.  His neurologic exam is reassuring.  I did consider possible intracranial malignancy, CVA as the etiology of his headaches.  However, given multiple reassuring neurologic exams, feel that this is less likely.  Do not feel that CT head is indicated at this time.  I suspect his headaches are likely secondary to migraine headache versus tension headache.  They typically resolve with Tylenol, which has already been given to the patient here.  Will give ibuprofen as well for pain relief.  Repeat ECG was obtained.  I independently interpreted ECG, which is nonischemic.  I considered repeating labs.  However, given how recently patient had laboratory workup performed, do not feel that repeat laboratory workup is indicated at this time.  Patient would  benefit from cardiology follow-up.  Expressed the importance of this to patient.  I provided reassurance to the patient that his symptoms are likely more benign in nature.  Patient's presentation is most consistent with {EM COPA:27473}   {Document critical care time when appropriate:1} {Document review of labs and clinical decision tools ie heart score, Chads2Vasc2 etc:1}  {Document your independent review of radiology images, and any outside records:1} {Document your discussion with family members, caretakers, and with consultants:1} {Document social determinants of health affecting pt's care:1} {Document your decision making why or why not admission, treatments were needed:1} Final Clinical Impression(s) / ED Diagnoses Final diagnoses:  Other migraine without status migrainosus, not intractable    Rx / DC Orders ED Discharge Orders     [name]

## 2023-05-13 NOTE — ED Provider Notes (Signed)
[place] EMERGENCY DEPARTMENT AT [facility] Provider Note   CSN: [id number] Arrival date & time: 05/13/23  1221     History  Chief Complaint  Patient presents with   Migraine   Anxiety    [place] [name] is a 27 y.o. male.  27 year old male with no significant past medical history presents here for right-sided headache and episode of tachycardia that occurred earlier today.  Patient was seen for headache overnight last night.  He had laboratory workup performed, which was notable only for potassium that was on the lower end of normal.  Patient was discharged with potassium supplementation.  Patient [place] [name] presents as he had an additional episode of the headache that he was seen for previously.  He describes as a pressure sensation that is located primarily on the right, parietal portion of his head.  He also endorses feeling a pressure behind his right eye.  He denies photophobia.  Although, endorses some phonophobia with this.  Did have some mild nausea with it.  No vomiting.  He also had an additional episode of heart racing.  Heart rate as high as 170s on his Apple Watch.  He denies feeling chest pain, shortness of breath.  He does endorse having some palpitations with this.  Patient symptoms resolved spontaneously.  He denies any recent illnesses.  Was given a referral to cardiology at his visit earlier today.  That appointment is tomorrow morning.  Patient was also recently prescribed hydroxyzine for anxiety.  He states that he has been taking 25 mg 2-3 times daily to help with his anxiety.  He does not currently have a PCP.  The history is provided by the patient and medical records.       Home Medications Prior to Admission medications   Medication Sig Start Date End Date Taking? Authorizing Provider  albuterol (VENTOLIN HFA) 108 (90 Base) MCG/ACT inhaler Inhale 1 puff into the lungs every 6 (six) hours as needed for wheezing or shortness of breath. 08/20/22   Lamptey,  Britta Mccreedy, MD  budesonide-formoterol (SYMBICORT) 80-4.5 MCG/ACT inhaler Inhale 2 puffs into the lungs 2 (two) times daily.    [provider]  hydrOXYzine (ATARAX) 25 MG tablet Take 1 tablet (25 mg total) by mouth every 6 (six) hours. 05/07/23   Geoffery Lyons, MD  potassium chloride SA (KLOR-CON M) 20 MEQ tablet Take 1 tablet (20 mEq total) by mouth 2 (two) times daily. 05/13/23   Dione Booze, MD  sertraline (ZOLOFT) 50 MG tablet Take 1 tablet (50 mg total) by mouth daily. 03/21/23   Eber Hong, MD      Allergies    Penicillins    Review of Systems   As noted in HPI  Physical Exam Updated Vital Signs BP 115/80   Pulse (!) 53   Temp 98.8 F (37.1 C)   Resp 17   Ht 6\' 1"  (1.854 m)   Wt 95.3 kg   SpO2 100%   BMI 27.71 kg/m  Physical Exam Vitals reviewed.  Constitutional:      General: He is not in acute distress.    Appearance: Normal appearance. He is normal weight. He is not ill-appearing, toxic-appearing or diaphoretic.  HENT:     Head: Normocephalic and atraumatic.     Nose: Nose normal.     Mouth/Throat:     Mouth: Mucous membranes are moist.  Eyes:     General: No visual field deficit.    Extraocular Movements: Extraocular movements intact.  Conjunctiva/sclera: Conjunctivae normal.     Pupils: Pupils are equal, round, and reactive to light.  Cardiovascular:     Rate and Rhythm: Normal rate and regular rhythm.     Pulses: Normal pulses.          Radial pulses are 2+ on the right side and 2+ on the left side.       Dorsalis pedis pulses are 2+ on the right side.     Heart sounds: Normal heart sounds. No murmur heard.    No friction rub. No gallop.  Pulmonary:     Effort: Pulmonary effort is normal. No respiratory distress.     Breath sounds: Normal breath sounds. No wheezing, rhonchi or rales.  Abdominal:     General: There is no distension.     Palpations: Abdomen is soft.     Tenderness: There is no abdominal tenderness. There is no guarding or  rebound.  Musculoskeletal:     Right lower leg: No edema.     Left lower leg: No edema.  Skin:    General: Skin is warm and dry.  Neurological:     Mental Status: He is alert.     GCS: GCS eye subscore is 4. GCS verbal subscore is 5. GCS motor subscore is 6.     Cranial Nerves: Cranial nerves 2-12 are intact. No cranial nerve deficit, dysarthria or facial asymmetry.     Sensory: Sensation is intact. No sensory deficit.     Motor: Motor function is intact. No weakness, tremor, abnormal muscle tone or pronator drift.     ED Results / Procedures / Treatments   Labs (all labs ordered are listed, but only abnormal results are displayed) Labs Reviewed - No data to display  EKG EKG Interpretation Date/Time:  Thursday May 13 2023 17:54:43 EST Ventricular Rate:  73 PR Interval:  150 QRS Duration:  94 QT Interval:  377 QTC Calculation: 416 R Axis:   61  Text Interpretation: Sinus rhythm No significant change since last tracing Confirmed by Richardean Canal [phone]) on 05/13/2023 6:00:27 PM  Radiology No results found.  Procedures Procedures    Medications Ordered in ED Medications  acetaminophen (TYLENOL) tablet 1,000 mg (1,000 mg Oral Given 05/13/23 1254)  ibuprofen (ADVIL) tablet 400 mg (400 mg Oral Given 05/13/23 1756)    ED Course/ Medical Decision Making/ A&P Clinical Course as of 05/14/23 0005  Thu May 13, 2023  1812 EKG 12-Lead Sinus rhythm.  Rate of 73.  Normal intervals.  No axis deviation.  T wave inversion in lead III.  Biphasic T wave noted in V5-V6.  No ST segment changes.  When compared to prior ECG from October 2024, no changes noted. [JR]    Clinical Course User Index [JR] Rolla Flatten, MD                                 Medical Decision Making Amount and/or Complexity of Data Reviewed ECG/medicine tests: ordered and independent interpretation performed. Decision-making details documented in ED Course.  Risk Prescription drug management. Decision  regarding hospitalization.   27 year old male presents here for repeat evaluation of headache and palpitations.  Was seen overnight for the same symptoms.  Vital stable on arrival.  On exam, patient is nontoxic-appearing and in no acute distress.  Cardiopulmonary exam is unremarkable.,  Notably patient's heart rate is well-controlled with pulse rate in the 50s.  Neurologic exam without deficits.  Reviewed the patient's laboratory workup from his most recent visit, which includes BMP, magnesium, CBC.  Overall, no abnormalities.  Although, potassium was 3.6, which is on the low end of normal.  Therefore, patient was discharged with potassium supplements.  Per the previous provider's note, they were concerned with potential arrhythmia.  Therefore, cardiology follow-up was arranged for patient.  He has an appointment tomorrow morning.  I reviewed the patient's ECG from his prior visit, which does not demonstrate any ischemic findings.  He is not having chest pain or shortness of breath.  Will plan to repeat the ECG now.  His neurologic exam is reassuring.  I did consider possible intracranial malignancy, CVA as the etiology of his headaches.  However, given multiple reassuring neurologic exams, feel that this is less likely.  Do not feel that CT head is indicated at this time.  I suspect his headaches are likely secondary to migraine headache versus tension headache.  They typically resolve with Tylenol, which has already been given to the patient here.  Will give ibuprofen as well for pain relief.  Repeat ECG was obtained.  I independently interpreted ECG, which is nonischemic.  I considered repeating labs.  However, given how recently patient had laboratory workup performed, do not feel that repeat laboratory workup is indicated at this time.  Patient would benefit from cardiology follow-up.  Expressed the importance of this to patient.  I provided reassurance to the patient that his symptoms are likely more  benign in nature.  I considered hospital admission.  However, I feel that the patient is appropriate for outpatient management.  He is to follow-up with cardiology tomorrow.  I also included resources for him to get established with a PCP.  Recommended over-the-counter Tylenol and ibuprofen for headache management.  Return precautions were discussed with the patient.  The patient was discharged in stable condition.  Patient's presentation is most consistent with acute, uncomplicated illness.         Final Clinical Impression(s) / ED Diagnoses Final diagnoses:  Other migraine without status migrainosus, not intractable    Rx / DC Orders ED Discharge Orders     None         Rolla Flatten, MD 05/14/23 0005    Charlynne Pander, MD 05/18/23 1452
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
## 2023-05-14 ENCOUNTER — Ambulatory Visit (HOSPITAL_BASED_OUTPATIENT_CLINIC_OR_DEPARTMENT_OTHER): Payer: Medicaid Other | Admitting: Cardiology

## 2023-05-14 ENCOUNTER — Emergency Department (HOSPITAL_COMMUNITY)
Admission: EM | Admit: 2023-05-14 | Discharge: 2023-05-14 | Disposition: A | Discharge status: Home / Self Care | Payer: Medicaid Other | Attending: Emergency Medicine | Admitting: Emergency Medicine

## 2023-05-14 ENCOUNTER — Emergency Department (HOSPITAL_COMMUNITY): Payer: Medicaid Other

## 2023-05-14 ENCOUNTER — Other Ambulatory Visit: Payer: Self-pay

## 2023-05-14 ENCOUNTER — Emergency Department: Payer: Medicaid Other

## 2023-05-14 ENCOUNTER — Encounter (HOSPITAL_COMMUNITY): Payer: Self-pay | Admitting: Emergency Medicine

## 2023-05-14 ENCOUNTER — Encounter (HOSPITAL_COMMUNITY): Payer: Self-pay

## 2023-05-14 DIAGNOSIS — R0789 Other chest pain: Secondary | ICD-10-CM | POA: Insufficient documentation

## 2023-05-14 DIAGNOSIS — R002 Palpitations: Secondary | ICD-10-CM

## 2023-05-14 DIAGNOSIS — R079 Chest pain, unspecified: Secondary | ICD-10-CM

## 2023-05-14 DIAGNOSIS — J45909 Unspecified asthma, uncomplicated: Secondary | ICD-10-CM | POA: Insufficient documentation

## 2023-05-14 DIAGNOSIS — D72819 Decreased white blood cell count, unspecified: Secondary | ICD-10-CM | POA: Diagnosis not present

## 2023-05-14 DIAGNOSIS — F419 Anxiety disorder, unspecified: Secondary | ICD-10-CM | POA: Insufficient documentation

## 2023-05-14 DIAGNOSIS — R030 Elevated blood-pressure reading, without diagnosis of hypertension: Secondary | ICD-10-CM | POA: Diagnosis not present

## 2023-05-14 DIAGNOSIS — R519 Headache, unspecified: Secondary | ICD-10-CM | POA: Insufficient documentation

## 2023-05-14 LAB — BASIC METABOLIC PANEL
Anion gap: 9 (ref 5–15)
BUN: 13 mg/dL (ref 6–20)
CO2: 25 mmol/L (ref 22–32)
Calcium: 9.3 mg/dL (ref 8.9–10.3)
Chloride: 103 mmol/L (ref 98–111)
Creatinine, Ser: 0.95 mg/dL (ref 0.61–1.24)
GFR, Estimated: >60 mL/min (ref >60)
Glucose, Bld: 104 mg/dL — ABNORMAL HIGH (ref 70–99)
Potassium: 3.5 mmol/L (ref 3.5–5.1)
Sodium: 137 mmol/L (ref 135–145)

## 2023-05-14 LAB — CBC WITH DIFFERENTIAL/PLATELET
Abs Immature Granulocytes: 0 10*3/uL (ref 0.00–0.07)
Basophils Absolute: 0 10*3/uL (ref 0.0–0.1)
Basophils Relative: 1 %
Eosinophils Absolute: 0.1 10*3/uL (ref 0.0–0.5)
Eosinophils Relative: 3 %
HCT: 42.9 % (ref 39.0–52.0)
Hemoglobin: 15.5 g/dL (ref 13.0–17.0)
Immature Granulocytes: 0 %
Lymphocytes Relative: 55 %
Lymphs Abs: 1.9 10*3/uL (ref 0.7–4.0)
MCH: 30 pg (ref 26.0–34.0)
MCHC: 36.1 g/dL — ABNORMAL HIGH (ref 30.0–36.0)
MCV: 83 fL (ref 80.0–100.0)
Monocytes Absolute: 0.3 10*3/uL (ref 0.1–1.0)
Monocytes Relative: 8 %
Neutro Abs: 1.2 10*3/uL — ABNORMAL LOW (ref 1.7–7.7)
Neutrophils Relative %: 33 %
Platelets: 233 10*3/uL (ref 150–400)
RBC: 5.17 MIL/uL (ref 4.22–5.81)
RDW: 11.9 % (ref 11.5–15.5)
WBC: 3.5 10*3/uL — ABNORMAL LOW (ref 4.0–10.5)
nRBC: 0 % (ref 0.0–0.2)

## 2023-05-14 LAB — TROPONIN I (HIGH SENSITIVITY)
Troponin I (High Sensitivity): 19 ng/L — ABNORMAL HIGH (ref <18)
Troponin I (High Sensitivity): 27 ng/L — ABNORMAL HIGH (ref <18)

## 2023-05-14 MED ORDER — ASPIRIN 81 MG PO CHEW
324.0000 mg | CHEWABLE_TABLET | Freq: Once | ORAL | Status: AC
  Administered 2023-05-14: 324 mg via ORAL
  Filled 2023-05-14: qty 4

## 2023-05-14 MED ORDER — MORPHINE SULFATE (PF) 4 MG/ML IV SOLN
4.0000 mg | Freq: Once | INTRAVENOUS | Status: DC
Start: 2023-05-14 — End: 2023-05-14

## 2023-05-14 MED ORDER — BD ASSURE BPM/AUTO ARM CUFF MISC: 1.0000 | Freq: Once | 0 refills | Status: AC

## 2023-05-14 MED ORDER — ACETAMINOPHEN 500 MG PO TABS
1000.0000 mg | ORAL_TABLET | Freq: Once | ORAL | Status: AC
  Administered 2023-05-14: 1000 mg via ORAL
  Filled 2023-05-14: qty 2

## 2023-05-14 NOTE — Discharge Instructions (Signed)
Follow-up with a primary doctor to discuss your situation.  You may need to be started on a regular medication to control your anxiety.
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
## 2023-05-14 NOTE — ED Triage Notes (Signed)
Pt reports he was just discharged for same earlier today.  States he left and took his hydroxyzine and then started feeling anxious again.  Reports he tried to walk, eat and take a shower but none of it helped.

## 2023-05-14 NOTE — ED Provider Notes (Signed)
[place] EMERGENCY DEPARTMENT AT [facility] Provider Note   CSN: [id number] Arrival date & time: 05/14/23  2841     History {Add pertinent medical, surgical, social history, OB history to HPI:1} Chief Complaint  Patient presents with   Chest Pain    Tracy [name] is a 27 y.o. male.  He presents to ER with multiple complaints.  He states to have intermittent palpitations and headaches for the past couple of months, has been to the ER several times.  He states initially that the chest pain was due to to GERD which was causing him to have anxiety triggering him to have fast heart rate.  He states he was taking medications for GERD and that was under control but over the past couple days he had several episodes of palpitation with heart rate on his Apple Watch been 150s and 176 when this happens, no dizziness or syncope with this.  He is come to the ER for this a couple of times and by that has resolved.    He was supposed have an appointment with a cardiologist this morning but states when walking to the car he was feeling worse than before, he also reports he still has this constant headache is a constant pressure to the right side of his head.  Occasionally gets better with Tylenol denies throbbing.  Mild sensitivity to light and noise.  No history of similar headaches.  Told yesterday they felt it was probably a migraine.  He has not had any imaging for this yet.  No vision changes, no numbness tingling or weakness, no sudden onset of pain.  No history of vomiting but mild nausea.  No tinnitus.  He states he knows what his anxiety feels like and this feels different, the hydroxyzine does not seem to be helping.  He reports the pain he felt as just this morning was sharper than the normal pressure he feels with the GERD.   Chest Pain      Home Medications Prior to Admission medications   Medication Sig Start Date End Date Taking? Authorizing Provider  albuterol (VENTOLIN  HFA) 108 (90 Base) MCG/ACT inhaler Inhale 1 puff into the lungs every 6 (six) hours as needed for wheezing or shortness of breath. 08/20/22   Lamptey, Britta Mccreedy, MD  budesonide-formoterol (SYMBICORT) 80-4.5 MCG/ACT inhaler Inhale 2 puffs into the lungs 2 (two) times daily.    [provider]  hydrOXYzine (ATARAX) 25 MG tablet Take 1 tablet (25 mg total) by mouth every 6 (six) hours. 05/07/23   Geoffery Lyons, MD  potassium chloride SA (KLOR-CON M) 20 MEQ tablet Take 1 tablet (20 mEq total) by mouth 2 (two) times daily. 05/13/23   Dione Booze, MD  sertraline (ZOLOFT) 50 MG tablet Take 1 tablet (50 mg total) by mouth daily. 03/21/23   Eber Hong, MD      Allergies    Penicillins    Review of Systems   Review of Systems  Cardiovascular:  Positive for chest pain.    Physical Exam Updated Vital Signs BP 120/87   Pulse 69   Temp 98 F (36.7 C) (Oral)   Resp (!) 23   SpO2 100%  Physical Exam Vitals and nursing note reviewed.  Constitutional:      General: He is not in acute distress.    Appearance: He is well-developed.  HENT:     Head: Normocephalic and atraumatic.  Eyes:     Conjunctiva/sclera: Conjunctivae normal.  Cardiovascular:  Rate and Rhythm: Normal rate and regular rhythm.     Heart sounds: No murmur heard. Pulmonary:     Effort: Pulmonary effort is normal. No respiratory distress.     Breath sounds: Normal breath sounds.  Abdominal:     Palpations: Abdomen is soft.     Tenderness: There is no abdominal tenderness.  Musculoskeletal:        General: No swelling.     Cervical back: Neck supple.     Right lower leg: No tenderness. No edema.     Left lower leg: No tenderness. No edema.  Skin:    General: Skin is warm and dry.     Capillary Refill: Capillary refill takes less than 2 seconds.  Neurological:     Mental Status: He is alert.  Psychiatric:        Mood and Affect: Mood normal.     ED Results / Procedures / Treatments   Labs (all labs  ordered are listed, but only abnormal results are displayed) Labs Reviewed  TROPONIN I (HIGH SENSITIVITY)    EKG EKG Interpretation Date/Time:  Friday May 14 2023 09:55:34 EST Ventricular Rate:  72 PR Interval:  154 QRS Duration:  91 QT Interval:  355 QTC Calculation: 389 R Axis:   44  Text Interpretation: Sinus rhythm Nonspecific T abnormalities, inferior leads no significant change since yesterday Confirmed by Pricilla Loveless [phone]) on 05/14/2023 10:11:06 AM  Radiology No results found.  Procedures Procedures  {Document cardiac monitor, telemetry assessment procedure when appropriate:1}  Medications Ordered in ED Medications - No data to display  ED Course/ Medical Decision Making/ A&P Clinical Course as of 05/14/23 1938  Fri May 14, 2023  5621 Patient here for chest pain palpitation, feels different than his usual anxiety and chest pain is felt different today, ultimately had some mildly elevated tropes, consulted cardiology who saw patient at bedside, please see their note for details but ultimately do not feel this is related to ACS and patient needs ambulatory monitor as outpatient and advised home BP monitoring as well twice per day, patient is agreeable with this.  He was given strict return precautions. [CB]    Clinical Course User Index [CB] Carmel Sacramento A, PA-C   {   Click here for ABCD2, HEART and other calculatorsREFRESH Note before signing :1}                              Medical Decision Making Amount and/or Complexity of Data Reviewed Labs: ordered. Radiology: ordered.  Risk OTC drugs.   ***  {Document critical care time when appropriate:1} {Document review of labs and clinical decision tools ie heart score, Chads2Vasc2 etc:1}  {Document your independent review of radiology images, and any outside records:1} {Document your discussion with family members, caretakers, and with consultants:1} {Document social determinants of health affecting  pt's care:1} {Document your decision making why or why not admission, treatments were needed:1} Final Clinical Impression(s) / ED Diagnoses Final diagnoses:  None    Rx / DC Orders ED Discharge Orders     None

## 2023-05-14 NOTE — ED Triage Notes (Signed)
Pt BIB [facility] with reports of chest pain that started while at home. Pt reports he was getting ready for cardiology appt when chest pain started. Pt describes it as his heart was racing.

## 2023-05-14 NOTE — Discharge Instructions (Addendum)
Pleasure taking care of you today.  You evaluated for headache-she had a normal head CT, also evaluated for chest pain and palpitations.  Due to elevated troponin level you are seen by cardiology.  They want you to follow-up in the office and they are going to put a cardiac monitor on you.  I also want you to check your blood pressure twice a day.  We are prescribing a blood pressure monitor.  Write these numbers down.  I also want you to discuss with your PCP getting tested for sleep apnea.  Come back to the ER for new or worsening symptoms.

## 2023-05-14 NOTE — ED Provider Notes (Signed)
[place] EMERGENCY DEPARTMENT AT [facility] Provider Note   CSN: [id number] Arrival date & time: 05/14/23  2054     History  Chief Complaint  Patient presents with   Anxiety    Tracy [name] is a 27 y.o. male.  Patient is a 27 year old male with history of asthma and anxiety.  Patient presenting today with complaints of anxiety and feeling generally unwell.  This has been ongoing for several months and patient has been seen multiple times in the ER with similar complaints.  This is his fourth visit in the past 2 days with similar complaints.  After leaving the ER in [place], patient began feeling anxious again and presents here to [facility].  The history is provided by the patient.       Home Medications Prior to Admission medications   Medication Sig Start Date End Date Taking? Authorizing Provider  albuterol (VENTOLIN HFA) 108 (90 Base) MCG/ACT inhaler Inhale 1 puff into the lungs every 6 (six) hours as needed for wheezing or shortness of breath. Patient not taking: Reported on 05/14/2023 08/20/22   Merrilee Jansky, MD  Blood Pressure Monitoring (B-D ASSURE BPM/AUTO ARM CUFF) MISC 1 each by Does not apply route once for 1 dose. 05/14/23 05/14/23  Carmel Sacramento A, PA-C  budesonide-formoterol (SYMBICORT) 80-4.5 MCG/ACT inhaler Inhale 2 puffs into the lungs 2 (two) times daily as needed (Asthma).    [provider]  hydrOXYzine (ATARAX) 25 MG tablet Take 1 tablet (25 mg total) by mouth every 6 (six) hours. Patient taking differently: Take 25 mg by mouth 3 (three) times daily. 05/07/23   Geoffery Lyons, MD  potassium chloride SA (KLOR-CON M) 20 MEQ tablet Take 1 tablet (20 mEq total) by mouth 2 (two) times daily. Patient not taking: Reported on 05/14/2023 05/13/23   Dione Booze, MD  sertraline (ZOLOFT) 50 MG tablet Take 1 tablet (50 mg total) by mouth daily. Patient not taking: Reported on 05/14/2023 03/21/23   Eber Hong, MD      Allergies     Penicillins    Review of Systems   Review of Systems  All other systems reviewed and are negative.   Physical Exam Updated Vital Signs BP (!) 141/103 (BP Location: Right Arm)   Pulse 79   Temp 99 F (37.2 C) (Oral)   Resp 16   Ht 6\' 1"  (1.854 m)   Wt 95.3 kg   SpO2 96%   BMI 27.71 kg/m  Physical Exam Vitals and nursing note reviewed.  Constitutional:      General: He is not in acute distress.    Appearance: He is well-developed. He is not diaphoretic.  HENT:     Head: Normocephalic and atraumatic.  Cardiovascular:     Rate and Rhythm: Normal rate and regular rhythm.     Heart sounds: No murmur heard.    No friction rub.  Pulmonary:     Effort: Pulmonary effort is normal. No respiratory distress.     Breath sounds: Normal breath sounds. No wheezing or rales.  Abdominal:     General: Bowel sounds are normal. There is no distension.     Palpations: Abdomen is soft.     Tenderness: There is no abdominal tenderness.  Musculoskeletal:        General: Normal range of motion.     Cervical back: Normal range of motion and neck supple.  Skin:    General: Skin is warm and dry.  Neurological:  Mental Status: He is alert and oriented to person, place, and time.     Coordination: Coordination normal.     ED Results / Procedures / Treatments   Labs (all labs ordered are listed, but only abnormal results are displayed) Labs Reviewed - No data to display  EKG None  Radiology CT Head Wo Contrast Result Date: 05/14/2023 CLINICAL DATA:  Provided history: Headache and increasing frequency or severity. EXAM: CT HEAD WITHOUT CONTRAST TECHNIQUE: Contiguous axial images were obtained from the base of the skull through the vertex without intravenous contrast. RADIATION DOSE REDUCTION: This exam was performed according to the departmental dose-optimization program which includes automated exposure control, adjustment of the mA and/or kV according to patient size and/or use of  iterative reconstruction technique. COMPARISON:  None. FINDINGS: Brain: Cerebral volume is normal. There is no acute intracranial hemorrhage. No demarcated cortical infarct. No extra-axial fluid collection. No evidence of an intracranial mass. No midline shift. Vascular: No hyperdense vessel. Skull: No calvarial fracture or aggressive osseous lesion. Sinuses/Orbits: No mass or acute finding within the imaged orbits. No significant paranasal sinus disease at the imaged levels. IMPRESSION: No evidence of an acute intracranial abnormality. Electronically Signed   By: Jackey Loge D.O.   On: 05/14/2023 12:21   DG Chest Portable 1 View Result Date: 05/14/2023 CLINICAL DATA:  Chest pain. EXAM: PORTABLE CHEST 1 VIEW COMPARISON:  03/29/2023. FINDINGS: Low lung volume. Bilateral lung fields are clear. Bilateral costophrenic angles are clear. Normal cardio-mediastinal silhouette. No acute osseous abnormalities. The soft tissues are within normal limits. IMPRESSION: No active disease. Electronically Signed   By: Jules Schick M.D.   On: 05/14/2023 12:05    Procedures Procedures    Medications Ordered in ED Medications - No data to display  ED Course/ Medical Decision Making/ A&P  Patient presenting here with complaints of anxiety as described in the HPI.  He has been seen here on multiple occasions with similar complaints.  I have reviewed his laboratory studies from earlier today.  I did note a trivially elevated troponin, the significance of which I am uncertain, but highly doubt a cardiac issue.  Patient states that he is now feeling better after waiting to be seen.  I do not feel as though any additional workup is indicated and that patient can safely be discharged with outpatient follow-up.  I have advised him to obtain a primary care physician to discuss these issues.  Perhaps some sort of regular medication may help control his anxiety.  Final Clinical Impression(s) / ED Diagnoses Final diagnoses:   Anxiety    Rx / DC Orders ED Discharge Orders     None         Geoffery Lyons, MD 05/14/23 2318

## 2023-05-15 ENCOUNTER — Encounter (HOSPITAL_COMMUNITY): Payer: Self-pay

## 2023-05-15 ENCOUNTER — Emergency Department (HOSPITAL_COMMUNITY)
Admission: EM | Admit: 2023-05-15 | Discharge: 2023-05-15 | Disposition: A | Discharge status: Home / Self Care | Payer: Medicaid Other | Attending: Student | Admitting: Student

## 2023-05-15 DIAGNOSIS — J45909 Unspecified asthma, uncomplicated: Secondary | ICD-10-CM | POA: Diagnosis not present

## 2023-05-15 DIAGNOSIS — F419 Anxiety disorder, unspecified: Secondary | ICD-10-CM | POA: Insufficient documentation

## 2023-05-15 DIAGNOSIS — R300 Dysuria: Secondary | ICD-10-CM | POA: Diagnosis not present

## 2023-05-15 DIAGNOSIS — Z8616 Personal history of COVID-19: Secondary | ICD-10-CM | POA: Diagnosis not present

## 2023-05-15 DIAGNOSIS — R002 Palpitations: Secondary | ICD-10-CM | POA: Diagnosis present

## 2023-05-15 DIAGNOSIS — Z87891 Personal history of nicotine dependence: Secondary | ICD-10-CM | POA: Insufficient documentation

## 2023-05-15 DIAGNOSIS — R079 Chest pain, unspecified: Secondary | ICD-10-CM | POA: Diagnosis not present

## 2023-05-15 DIAGNOSIS — R030 Elevated blood-pressure reading, without diagnosis of hypertension: Secondary | ICD-10-CM | POA: Diagnosis not present

## 2023-05-15 DIAGNOSIS — R519 Headache, unspecified: Secondary | ICD-10-CM | POA: Diagnosis not present

## 2023-05-15 LAB — URINALYSIS, ROUTINE W REFLEX MICROSCOPIC
Bacteria, UA: NONE SEEN
Bilirubin Urine: NEGATIVE
Glucose, UA: NEGATIVE mg/dL
Ketones, ur: NEGATIVE mg/dL
Leukocytes,Ua: NEGATIVE
Nitrite: NEGATIVE
Protein, ur: NEGATIVE mg/dL
Specific Gravity, Urine: 1.006 (ref 1.005–1.030)
pH: 5 (ref 5.0–8.0)

## 2023-05-15 MED ORDER — LORAZEPAM 0.5 MG PO TABS
0.5000 mg | ORAL_TABLET | Freq: Once | ORAL | Status: AC
  Administered 2023-05-15: 0.5 mg via ORAL
  Filled 2023-05-15: qty 1

## 2023-05-15 NOTE — ED Triage Notes (Signed)
Pt reports he has been having headaches that feel like pressure in different areas of his head.
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
## 2023-05-17 ENCOUNTER — Other Ambulatory Visit: Payer: Self-pay

## 2023-05-17 ENCOUNTER — Other Ambulatory Visit (HOSPITAL_COMMUNITY): Payer: Self-pay | Admitting: *Deleted

## 2023-05-17 ENCOUNTER — Emergency Department (HOSPITAL_COMMUNITY)
Admission: EM | Admit: 2023-05-17 | Discharge: 2023-05-17 | Disposition: A | Discharge status: Home / Self Care | Payer: Medicaid Other | Attending: Emergency Medicine | Admitting: Emergency Medicine

## 2023-05-17 ENCOUNTER — Emergency Department (HOSPITAL_COMMUNITY): Payer: Medicaid Other

## 2023-05-17 ENCOUNTER — Encounter (HOSPITAL_COMMUNITY): Payer: Self-pay | Admitting: Emergency Medicine

## 2023-05-17 DIAGNOSIS — F419 Anxiety disorder, unspecified: Secondary | ICD-10-CM | POA: Diagnosis not present

## 2023-05-17 DIAGNOSIS — R079 Chest pain, unspecified: Secondary | ICD-10-CM | POA: Insufficient documentation

## 2023-05-17 DIAGNOSIS — R9431 Abnormal electrocardiogram [ECG] [EKG]: Secondary | ICD-10-CM | POA: Diagnosis not present

## 2023-05-17 DIAGNOSIS — J45909 Unspecified asthma, uncomplicated: Secondary | ICD-10-CM | POA: Diagnosis not present

## 2023-05-17 LAB — CBC
HCT: 41.8 % (ref 39.0–52.0)
Hemoglobin: 15.1 g/dL (ref 13.0–17.0)
MCH: 30.4 pg (ref 26.0–34.0)
MCHC: 36.1 g/dL — ABNORMAL HIGH (ref 30.0–36.0)
MCV: 84.1 fL (ref 80.0–100.0)
Platelets: 242 10*3/uL (ref 150–400)
RBC: 4.97 MIL/uL (ref 4.22–5.81)
RDW: 12 % (ref 11.5–15.5)
WBC: 4.1 10*3/uL (ref 4.0–10.5)
nRBC: 0 % (ref 0.0–0.2)

## 2023-05-17 LAB — ECHOCARDIOGRAM COMPLETE
AR max vel: 1.92 cm2
AV Area VTI: 2.07 cm2
AV Area mean vel: 2.05 cm2
AV Mean grad: 4.5 mm[Hg]
AV Peak grad: 10.3 mm[Hg]
Ao pk vel: 1.6 m/s
Area-P 1/2: 4.15 cm2
Height: 73 in
S' Lateral: 3.4 cm
Weight: 3392 [oz_av]

## 2023-05-17 LAB — BASIC METABOLIC PANEL
Anion gap: 8 (ref 5–15)
BUN: 14 mg/dL (ref 6–20)
CO2: 25 mmol/L (ref 22–32)
Calcium: 9 mg/dL (ref 8.9–10.3)
Chloride: 106 mmol/L (ref 98–111)
Creatinine, Ser: 1.05 mg/dL (ref 0.61–1.24)
GFR, Estimated: >60 mL/min (ref >60)
Glucose, Bld: 102 mg/dL — ABNORMAL HIGH (ref 70–99)
Potassium: 3.5 mmol/L (ref 3.5–5.1)
Sodium: 139 mmol/L (ref 135–145)

## 2023-05-17 LAB — RAPID URINE DRUG SCREEN, HOSP PERFORMED
Amphetamines: NOT DETECTED
Barbiturates: NOT DETECTED
Benzodiazepines: NOT DETECTED
Cocaine: NOT DETECTED
Opiates: NOT DETECTED
Tetrahydrocannabinol: NOT DETECTED

## 2023-05-17 LAB — GC/CHLAMYDIA PROBE AMP (~~LOC~~) NOT AT ARMC
Chlamydia: NEGATIVE
Comment: NEGATIVE
Comment: NORMAL
Neisseria Gonorrhea: NEGATIVE

## 2023-05-17 LAB — TSH: TSH: 1.76 u[IU]/mL (ref 0.350–4.500)

## 2023-05-17 LAB — URINALYSIS, ROUTINE W REFLEX MICROSCOPIC
Bilirubin Urine: NEGATIVE
Glucose, UA: NEGATIVE mg/dL
Hgb urine dipstick: NEGATIVE
Ketones, ur: NEGATIVE mg/dL
Leukocytes,Ua: NEGATIVE
Nitrite: NEGATIVE
Protein, ur: NEGATIVE mg/dL
Specific Gravity, Urine: 1.014 (ref 1.005–1.030)
pH: 6 (ref 5.0–8.0)

## 2023-05-17 LAB — TROPONIN I (HIGH SENSITIVITY)
Troponin I (High Sensitivity): 37 ng/L — ABNORMAL HIGH (ref <18)
Troponin I (High Sensitivity): 37 ng/L — ABNORMAL HIGH (ref <18)

## 2023-05-17 MED ORDER — LORAZEPAM 0.5 MG PO TABS
0.5000 mg | ORAL_TABLET | Freq: Once | ORAL | Status: AC
  Administered 2023-05-17: 0.5 mg via ORAL
  Filled 2023-05-17: qty 1

## 2023-05-17 MED ORDER — HYOSCYAMINE SULFATE 0.125 MG SL SUBL
0.2500 mg | SUBLINGUAL_TABLET | Freq: Once | SUBLINGUAL | Status: AC
  Administered 2023-05-17: 0.25 mg via SUBLINGUAL
  Filled 2023-05-17: qty 2

## 2023-05-17 MED ORDER — LORAZEPAM 0.5 MG PO TABS: 0.5000 mg | ORAL_TABLET | Freq: Three times a day (TID) | ORAL | 0 refills | Status: DC | PRN

## 2023-05-17 MED ORDER — ALUM & MAG HYDROXIDE-SIMETH 200-200-20 MG/5ML PO SUSP
30.0000 mL | Freq: Once | ORAL | Status: AC
  Administered 2023-05-17: 30 mL via ORAL
  Filled 2023-05-17: qty 30

## 2023-05-17 NOTE — Discharge Instructions (Signed)
  Providers Accepting New Patients in [place], [place]    [facility] [address]  [place], [place] [address] [phone] Accepts most insurances  [facility] [address] [place], [place] [address] [phone] Accepts most insurances  [facility] [address] [place], [place] [address]  [phone] Must meet requirements  [facility] [address] [place], [place] [address] [phone] Accepts most insurances  [facility] [address]  [place], [place] [address] [phone] Accepts most insurances  [facility] [address]   [place], [place]   [phone] Accepts most insurances  [facility])  [phone] [address]  [place], [place] [address] [phone] Accepts most insurances     [facility] [address]  [place], [place] [address] [phone] Accepts most insurances  [facility] Department [address] [place], [place] [address] [phone] option 1 Accepts Medicaid and [facility] Internal Medicine [address]  [place], [place] [address] [phone] Accepts most insurances  Avon Gully, MD [address] [place], [place] [address] [phone] Accepts most insurances  [facility] at [facility] [address]  [place], [place] [address] [phone] Accepts most insurances  [facility] [phone] [place], [place] [address] [phone] Accepts most insurances  [place], [address] [place], [place] [address] [phone]  Accepts most insurances

## 2023-05-17 NOTE — ED Provider Notes (Signed)
[place] EMERGENCY DEPARTMENT AT [facility] Provider Note   CSN: [id number] Arrival date & time: 05/17/23  1191     History  Chief Complaint  Patient presents with   Anxiety    Tracy [name] is a 27 y.o. male patient with past medical history of asthma presenting to emergency room today with feeling 1 week of anxiety.  Patient reports he has had intermittent chest pain, intermittent palpitations, headache, nausea and increased shakiness.  Patient reports he has had history of anxiety, but this is significantly increased and feels atypical to him.  Patient does report that sometimes after eating food he notices burning in his chest consistent with what he thinks is acid reflux.  Denies alcohol use, drug use. Denies any fever, chills.  Patient denies any recent trauma injury or fall. No recent travel.   Anxiety Associated symptoms include chest pain.       Home Medications Prior to Admission medications   Medication Sig Start Date End Date Taking? Authorizing Provider  albuterol (VENTOLIN HFA) 108 (90 Base) MCG/ACT inhaler Inhale 1 puff into the lungs every 6 (six) hours as needed for wheezing or shortness of breath. Patient not taking: Reported on 05/14/2023 08/20/22   Merrilee Jansky, MD  budesonide-formoterol [facility]) 80-4.5 MCG/ACT inhaler Inhale 2 puffs into the lungs 2 (two) times daily as needed (Asthma).    [provider]  hydrOXYzine (ATARAX) 25 MG tablet Take 1 tablet (25 mg total) by mouth every 6 (six) hours. Patient taking differently: Take 25 mg by mouth 3 (three) times daily. 05/07/23   Geoffery Lyons, MD  potassium chloride SA (KLOR-CON M) 20 MEQ tablet Take 1 tablet (20 mEq total) by mouth 2 (two) times daily. Patient not taking: Reported on 05/14/2023 05/13/23   Dione Booze, MD  sertraline (ZOLOFT) 50 MG tablet Take 1 tablet (50 mg total) by mouth daily. Patient not taking: Reported on 05/14/2023 03/21/23   Eber Hong, MD       Allergies    Penicillins    Review of Systems   Review of Systems  Cardiovascular:  Positive for chest pain and palpitations.    Physical Exam Updated Vital Signs BP 130/82 (BP Location: Left Arm)   Pulse 70   Temp 98.6 F (37 C) (Oral)   Resp 17   SpO2 97%  Physical Exam Vitals and nursing note reviewed.  Constitutional:      General: He is not in acute distress.    Appearance: He is not toxic-appearing.  HENT:     Head: Normocephalic and atraumatic.  Eyes:     General: No scleral icterus.    Conjunctiva/sclera: Conjunctivae normal.  Cardiovascular:     Rate and Rhythm: Normal rate and regular rhythm.     Pulses: Normal pulses.     Heart sounds: Normal heart sounds.  Pulmonary:     Effort: Pulmonary effort is normal. No respiratory distress.     Breath sounds: Normal breath sounds.  Abdominal:     General: Abdomen is flat. Bowel sounds are normal.     Palpations: Abdomen is soft.     Tenderness: There is no abdominal tenderness.  Musculoskeletal:     Right lower leg: No edema.     Left lower leg: No edema.  Skin:    General: Skin is warm and dry.     Findings: No lesion.  Neurological:     General: No focal deficit present.     Mental Status: He is  alert and oriented to person, place, and time. Mental status is at baseline.     Comments: Patient has no neurological deficit on exam.  No cranial nerve deficits.  Pupils equal and reactive.  Psychiatric:     Comments: Patient is alert oriented answering questions appropriately.  Does not have racing thoughts or confusion.     ED Results / Procedures / Treatments   Labs (all labs ordered are listed, but only abnormal results are displayed) Labs Reviewed  BASIC METABOLIC PANEL - Abnormal; Notable for the following components:      Result Value   Glucose, Bld 102 (*)    All other components within normal limits  CBC - Abnormal; Notable for the following components:   MCHC 36.1 (*)    All other components  within normal limits  URINALYSIS, ROUTINE W REFLEX MICROSCOPIC - Abnormal; Notable for the following components:   Color, Urine STRAW (*)    All other components within normal limits  TROPONIN I (HIGH SENSITIVITY) - Abnormal; Notable for the following components:   Troponin I (High Sensitivity) 37 (*)    All other components within normal limits  RAPID URINE DRUG SCREEN, HOSP PERFORMED  TROPONIN I (HIGH SENSITIVITY)    EKG EKG Interpretation Date/Time:  Monday May 17 2023 10:53:11 EST Ventricular Rate:  64 PR Interval:  156 QRS Duration:  87 QT Interval:  367 QTC Calculation: 379 R Axis:   45  Text Interpretation: Sinus rhythm Nonspecific T abnormalities, diffuse leads Confirmed by Gloris Manchester [phone]) on 05/17/2023 11:01:21 AM  Radiology DG Chest Portable 1 View Result Date: 05/17/2023 CLINICAL DATA:  Chest pain.  Anxiety. EXAM: PORTABLE CHEST 1 VIEW COMPARISON:  Chest radiograph dated May 14, 2023. FINDINGS: The heart size and mediastinal contours are within normal limits. Both lungs are clear. No pleural effusion or pneumothorax. No acute osseous abnormality. IMPRESSION: No active disease. Electronically Signed   By: Hart Robinsons M.D.   On: 05/17/2023 11:05    Procedures Procedures    Medications Ordered in ED Medications  LORazepam (ATIVAN) tablet 0.5 mg (has no administration in time range)  alum & mag hydroxide-simeth (MAALOX/MYLANTA) 200-200-20 MG/5ML suspension 30 mL (has no administration in time range)  hyoscyamine (LEVSIN SL) SL tablet 0.25 mg (has no administration in time range)    ED Course/ Medical Decision Making/ A&P Clinical Course as of 05/17/23 1651  Mon May 17, 2023  1256 Reports slight improvement of symptoms. Waiting for repeat troponin and UA. [JB]  1641 Echo present in room. Dispo pending echo results.  [JB]    Clinical Course User Index [JB] Samantha Olivera, Horald Chestnut, PA-C                                 Medical Decision Making Amount  and/or Complexity of Data Reviewed Labs: ordered. Radiology: ordered.  Risk OTC drugs. Prescription drug management.   Tracy [name] 27 y.o. presented today for chest pain. Working DDx that I considered at this time includes, but not limited to, ACS, GERD, pe, pna, aortic dissection, pneumothorax, MSK path, anemia, esophageal rupture, CHF exacerbation, valvular disorder, myocarditis, pericarditis, endocarditis, pericardial effusion/cardiac tamponade, pulmonary edema, gastritis/PUD, esophagitis.  R/o Dx: PE: advanced imaging will not be ordered as alternative diagnoses are more likely at this time Aortic Dissection: less likely based on the history of present illness - location, quality, onset, and severity of symptoms in this case.   Review  of prior external notes: Reviewed prior visit 05/15/2023 and 12/13 2024 for similar complaint.  Reviewed cardiology note 05/14/2023.  Unique Tests and My Interpretation:  EKG: Rate, rhythm, axis, intervals all examined: sinus with no specific T wave changes  Troponin: 37, repeat 37 CXR: No acute findings CBC: No leukocytosis, hemoglobin 15.1 BMP: Electrolyte abnormality, glucose 102  Consult: Spoke with cardiology who will place echo while he is here.  Report as long as echo without abnormality will be okay to resume current plan of following up in office with Holter monitor and planned stress test.  Problem List / ED Course / Critical interventions / Medication management  Reporting to emergency room with anxiety chest pain.  Patient hemodynamically stable well-appearing answering questions appropriately.  Patient reports that he has had a week of feeling nauseous, nervousness or jittery, decreased sleep, chest discomfort.  Patient's symptoms improved after Ativan.  Patient did have elevated troponin of 37, discussed case with cardiology who will order echo.  As long as echo is within normal limits patient will be okay to follow-up with cardiology  outpatient for planned stress test and ZIO monitor. I ordered medication including Ativan Reevaluation of the patient after these medicines showed that the patient improved Patients vitals assessed. Upon arrival patient is hemodynamically stable.  I have reviewed the patients home medicines and have made adjustments as needed     Plan:  Dispo pending ECHO         Final Clinical Impression(s) / ED Diagnoses Final diagnoses:  Chest pain, unspecified type    Rx / DC Orders ED Discharge Orders     None         Smitty Knudsen, PA-C 05/17/23 1705    Gloris Manchester, MD 05/18/23 [phone]

## 2023-05-17 NOTE — ED Triage Notes (Signed)
Pt here 2 days ago for same. Pt states he is having anxiety a lot and new meds are not working. Pt arrived slightly anxious. States has intermittent symptoms of nausea, headache and heart racing per pt. Pt a/o. Nad. States feels shaky and confused at this time.

## 2023-05-17 NOTE — ED Notes (Signed)
Pt waiting for ride.

## 2023-05-17 NOTE — Progress Notes (Signed)
*  PRELIMINARY RESULTS* Echocardiogram 2D Echocardiogram has been performed.  Tracy [name] 05/17/2023, 5:05 PM

## 2023-05-17 NOTE — ED Provider Notes (Signed)
  Physical Exam  BP 130/83   Pulse (!) 56   Temp 97.9 F (36.6 C) (Oral)   Resp 15   Ht 6\' 1"  (1.854 m)   Wt 96.2 kg   SpO2 99%   BMI 27.97 kg/m   Physical Exam  Procedures  Procedures  ED Course / MDM   Clinical Course as of 05/17/23 1700  Mon May 17, 2023  1256 Reports slight improvement of symptoms. Waiting for repeat troponin and UA. [JB]  1641 Echo present in room. Dispo pending echo results.  [JB]    Clinical Course User Index [JB] Barrett, Horald Chestnut, PA-C   Medical Decision Making Amount and/or Complexity of Data Reviewed Labs: ordered. Radiology: ordered.  Risk OTC drugs. Prescription drug management.   Here with anxiety - palpitation, nausea CP with elevated troponins EKG nonspecific T-waves, o/w sinus Getting ECHO Per cardiology, follow up with outpt cards If abnormal, re-page cards  Cards consulted 5 days ago in ED Pending outpatient holter, stress  Per Dr. Wyline Mood, cardiology, ECHO is normal today. He can be discharged and will continue planned outpatient follow up .        Elpidio Anis, PA-C 05/17/23 1815    Gloris Manchester, MD 05/18/23 [phone]

## 2023-05-17 NOTE — ED Notes (Signed)
Pt unable to provide urine sample at this time. Urinal provided at bedside

## 2023-05-17 NOTE — ED Notes (Signed)
Sister. Morrie Sheldon called for an update. Sister updated

## 2023-05-17 NOTE — ED Notes (Signed)
Pt went to bathroom without providing sample. RN reminded pt a sample has been requested by the provider. Urinal is at bedside.

## 2023-05-17 NOTE — ED Notes (Signed)
Father called for an update. Father is not listed in pt contacts. RN informed father to call son for information

## 2023-05-20 DIAGNOSIS — R002 Palpitations: Secondary | ICD-10-CM

## 2023-06-06 ENCOUNTER — Other Ambulatory Visit: Payer: Self-pay

## 2023-06-06 ENCOUNTER — Emergency Department (HOSPITAL_COMMUNITY)
Admission: EM | Admit: 2023-06-06 | Discharge: 2023-06-06 | Disposition: A | Discharge status: Home / Self Care | Payer: Medicaid Other | Attending: Emergency Medicine | Admitting: Emergency Medicine

## 2023-06-06 ENCOUNTER — Encounter (HOSPITAL_COMMUNITY): Payer: Self-pay

## 2023-06-06 DIAGNOSIS — M545 Low back pain, unspecified: Secondary | ICD-10-CM | POA: Insufficient documentation

## 2023-06-06 DIAGNOSIS — R0602 Shortness of breath: Secondary | ICD-10-CM | POA: Diagnosis not present

## 2023-06-06 DIAGNOSIS — R0981 Nasal congestion: Secondary | ICD-10-CM | POA: Insufficient documentation

## 2023-06-06 DIAGNOSIS — Z1152 Encounter for screening for COVID-19: Secondary | ICD-10-CM | POA: Insufficient documentation

## 2023-06-06 DIAGNOSIS — R059 Cough, unspecified: Secondary | ICD-10-CM | POA: Insufficient documentation

## 2023-06-06 DIAGNOSIS — K219 Gastro-esophageal reflux disease without esophagitis: Secondary | ICD-10-CM

## 2023-06-06 DIAGNOSIS — J069 Acute upper respiratory infection, unspecified: Secondary | ICD-10-CM | POA: Insufficient documentation

## 2023-06-06 DIAGNOSIS — Z5321 Procedure and treatment not carried out due to patient leaving prior to being seen by health care provider: Secondary | ICD-10-CM | POA: Diagnosis not present

## 2023-06-06 DIAGNOSIS — J349 Unspecified disorder of nose and nasal sinuses: Secondary | ICD-10-CM | POA: Diagnosis not present

## 2023-06-06 NOTE — ED Notes (Signed)
 Patient stated "My chest is feeling weird and would like to see MD." MD notified.

## 2023-06-06 NOTE — Discharge Instructions (Signed)
 Continue your daily famotidine .  Take over-the-counter Maalox as needed.  Avoid foods that worsen reflux symptoms: Chocolate, coffee, tomato-based products, and spicy foods.  Keep your appointment with cardiology on the 17th.  Return to the emergency department for any new or worsening symptoms of concern.

## 2023-06-06 NOTE — ED Notes (Signed)
 ED Provider at bedside.

## 2023-06-06 NOTE — ED Triage Notes (Signed)
 Pt arrived via POV form home c/o recurrent anxiety. Pt reports symptoms began after eating pizza and some cookies last night. Pt reports having an episode of emesis after arriving, and reports feeling slightly better afterwards.

## 2023-06-06 NOTE — ED Provider Notes (Signed)
 [place] EMERGENCY DEPARTMENT AT [facility] Provider Note   CSN: [id number] Arrival date & time: 06/06/23  9482     History  Chief Complaint  Patient presents with   Anxiety    Tracy [name] is a 28 y.o. male.   Anxiety Associated symptoms include chest pain.  Patient presents for episode of chest discomfort and tachycardia.  He was out late last night.  He denies use of alcohol or illicit drugs.  He came home at around 3 AM.  He ate some pizza and cookies and subsequently developed burning sensation in his chest.  With this, he noted a fast heart rate on his watch.  He felt some chest pressure.  He had relief of his symptoms after he vomited.  He is currently asymptomatic.  Patient recently underwent cardiac workup.  He wore a Zio patch monitor and underwent echocardiogram.  He has follow-up appointment scheduled with cardiology in 12 days.     Home Medications Prior to Admission medications   Medication Sig Start Date End Date Taking? Authorizing Provider  clonazePAM  (KLONOPIN ) 0.5 MG tablet Take 0.5 mg by mouth daily. 06/05/23  Yes [provider]  famotidine  (PEPCID ) 20 MG tablet Take 20 mg by mouth 2 (two) times daily. 05/19/23  Yes [provider]  LORazepam  (ATIVAN ) 0.5 MG tablet Take 1 tablet (0.5 mg total) by mouth 3 (three) times daily as needed for anxiety. 05/17/23  Yes Upstill, Margit, PA-C  QUEtiapine (SEROQUEL) 25 MG tablet Take 25 mg by mouth at bedtime. 05/29/23  Yes [provider]  albuterol  (VENTOLIN  HFA) 108 (90 Base) MCG/ACT inhaler Inhale 1 puff into the lungs every 6 (six) hours as needed for wheezing or shortness of breath. Patient not taking: Reported on 05/14/2023 08/20/22   Blaise Aleene KIDD, MD  budesonide-formoterol (SYMBICORT) 80-4.5 MCG/ACT inhaler Inhale 2 puffs into the lungs 2 (two) times daily as needed (Asthma).    [provider]  hydrOXYzine  (ATARAX ) 25 MG tablet Take 1 tablet (25 mg total) by mouth  every 6 (six) hours. Patient taking differently: Take 25 mg by mouth 3 (three) times daily. 05/07/23   Geroldine Berg, MD  potassium chloride  SA (KLOR-CON  M) 20 MEQ tablet Take 1 tablet (20 mEq total) by mouth 2 (two) times daily. Patient not taking: Reported on 05/14/2023 05/13/23   Raford Lenis, MD  sertraline  (ZOLOFT ) 50 MG tablet Take 1 tablet (50 mg total) by mouth daily. Patient not taking: Reported on 05/14/2023 03/21/23   Cleotilde Rogue, MD      Allergies    Penicillins    Review of Systems   Review of Systems  Cardiovascular:  Positive for chest pain and palpitations.  Psychiatric/Behavioral:  The patient is nervous/anxious.   All other systems reviewed and are negative.   Physical Exam Updated Vital Signs BP 126/82   Pulse 69   Temp 98.7 F (37.1 C) (Oral)   Resp 16   Ht 6' 1 (1.854 m)   Wt 96.1 kg   SpO2 98%   BMI 27.95 kg/m  Physical Exam Vitals and nursing note reviewed.  Constitutional:      General: He is not in acute distress.    Appearance: Normal appearance. He is well-developed. He is not ill-appearing, toxic-appearing or diaphoretic.  HENT:     Head: Normocephalic and atraumatic.     Right Ear: External ear normal.     Left Ear: External ear normal.     Nose: Nose normal.  Mouth/Throat:     Mouth: Mucous membranes are moist.  Eyes:     Extraocular Movements: Extraocular movements intact.     Conjunctiva/sclera: Conjunctivae normal.  Cardiovascular:     Rate and Rhythm: Normal rate and regular rhythm.     Heart sounds: No murmur heard. Pulmonary:     Effort: Pulmonary effort is normal. No respiratory distress.     Breath sounds: Normal breath sounds. No wheezing or rales.  Abdominal:     General: There is no distension.     Palpations: Abdomen is soft.     Tenderness: There is no abdominal tenderness.  Musculoskeletal:        General: No swelling. Normal range of motion.     Cervical back: Normal range of motion and neck supple.     Right  lower leg: No edema.     Left lower leg: No edema.  Skin:    General: Skin is warm and dry.     Coloration: Skin is not jaundiced or pale.  Neurological:     General: No focal deficit present.     Mental Status: He is alert and oriented to person, place, and time.  Psychiatric:        Mood and Affect: Mood normal.        Behavior: Behavior normal.     ED Results / Procedures / Treatments   Labs (all labs ordered are listed, but only abnormal results are displayed) Labs Reviewed - No data to display  EKG EKG Interpretation Date/Time:  Sunday June 06 2023 06:47:39 EST Ventricular Rate:  68 PR Interval:  162 QRS Duration:  82 QT Interval:  367 QTC Calculation: 391 R Axis:   61  Text Interpretation: Sinus rhythm Confirmed by Melvenia Motto ([id number]) on 06/06/2023 7:35:55 AM  Radiology No results found.  Procedures Procedures    Medications Ordered in ED Medications - No data to display  ED Course/ Medical Decision Making/ A&P                                 Medical Decision Making  Patient presenting for transient episode of burning chest discomfort, palpitations, and anxiety.  This occurred after eating pizza earlier this morning.  He does have history of reflux.  His symptoms resolved after 1 episode of emesis.  He denies any current symptoms.  On arrival in the ED, vital signs are normal.  He is well-appearing.  He has undergone extensive workup during prior ED visits, as recently as 3 weeks ago.  He is currently being followed by outpatient cardiology and has an upcoming appointment.  History consistent with reflux etiology of his symptoms this morning.  EKG shows normal sinus rhythm.  Given his well appearance, normal vital signs, absence of current symptoms and close follow-up, patient was discharged without further workup.        Final Clinical Impression(s) / ED Diagnoses Final diagnoses:  Gastroesophageal reflux disease without esophagitis    Rx / DC  Orders ED Discharge Orders     None         Melvenia Motto, MD 06/06/23 [phone]

## 2023-06-07 ENCOUNTER — Other Ambulatory Visit: Payer: Self-pay

## 2023-06-07 ENCOUNTER — Emergency Department (HOSPITAL_COMMUNITY)
Admission: EM | Admit: 2023-06-07 | Discharge: 2023-06-07 | Disposition: A | Discharge status: Home / Self Care | Payer: Medicaid Other | Attending: Emergency Medicine | Admitting: Emergency Medicine

## 2023-06-07 ENCOUNTER — Encounter (HOSPITAL_COMMUNITY): Payer: Self-pay

## 2023-06-07 DIAGNOSIS — R519 Headache, unspecified: Secondary | ICD-10-CM | POA: Insufficient documentation

## 2023-06-07 NOTE — ED Triage Notes (Signed)
 Pt to ED from home cc head pressure 20 min pta. States he felt like something was crumbling up in the back of his head and ears. States the feeling has passed, but c/o slight headache now. Has anxiety meds he was going to take but decided to wait because he wasn't sure if it was an anxiety attack.

## 2023-06-07 NOTE — Discharge Instructions (Addendum)
 Your exam did not show any serious causes of the feeling in the back of your head.  If it comes back, you are welcome to apply ice and take over-the-counter medication such as ibuprofen  and acetaminophen .  If it anytime you are concerned, you are welcome to come in to the emergency department to have it evaluated.  Otherwise, follow-up with the neurologist.

## 2023-06-07 NOTE — ED Provider Notes (Signed)
 [place] EMERGENCY DEPARTMENT AT [facility] Provider Note   CSN: [id number] Arrival date & time: 06/07/23  0103     History  Chief Complaint  Patient presents with   Headache    Tracy [name] is a 28 y.o. male.  The history is provided by the patient.  Headache He has history of anxiety and comes in complaining of a feeling like something is crunching in the back of his head.  He feels some irregularity there and thinks there may be a lump.  This started about 11 PM.  He has had it in the past.  He denies any vision disturbance, weakness, numbness.  He has not taken anything for it.   Home Medications Prior to Admission medications   Medication Sig Start Date End Date Taking? Authorizing Provider  budesonide-formoterol (SYMBICORT) 80-4.5 MCG/ACT inhaler Inhale 2 puffs into the lungs 2 (two) times daily as needed (Asthma).    [provider]  clonazePAM  (KLONOPIN ) 0.5 MG tablet Take 0.5 mg by mouth daily. 06/05/23   [provider]  famotidine  (PEPCID ) 20 MG tablet Take 20 mg by mouth 2 (two) times daily. 05/19/23   [provider]  hydrOXYzine  (ATARAX ) 25 MG tablet Take 1 tablet (25 mg total) by mouth every 6 (six) hours. Patient taking differently: Take 25 mg by mouth 3 (three) times daily. 05/07/23   Geroldine Berg, MD  LORazepam  (ATIVAN ) 0.5 MG tablet Take 1 tablet (0.5 mg total) by mouth 3 (three) times daily as needed for anxiety. 05/17/23   Odell Balls, PA-C  QUEtiapine (SEROQUEL) 25 MG tablet Take 25 mg by mouth at bedtime. 05/29/23   [provider]      Allergies    Penicillins    Review of Systems   Review of Systems  Neurological:  Positive for headaches.  All other systems reviewed and are negative.   Physical Exam Updated Vital Signs BP (!) 158/94   Pulse 68   Temp 98.2 F (36.8 C) (Oral)   Resp 18   Ht 6' 1 (1.854 m)   Wt 95.7 kg   SpO2 96%   BMI 27.84 kg/m  Physical Exam Vitals and nursing note  reviewed.   28 year old male, resting comfortably and in no acute distress. Vital signs are significant for elevated blood pressure. Oxygen  saturation is 96%, which is normal. Head is normocephalic and atraumatic. PERRLA, EOMI. Oropharynx is clear. Neck is nontender and supple without adenopathy. Lungs are clear without rales, wheezes, or rhonchi. Chest is nontender. Heart has regular rate and rhythm without murmur. Abdomen is soft, flat, nontender. Skin is warm and dry without rash. Neurologic: Mental status is normal, cranial nerves are intact, strength is 5/5 in all 4 extremities, there are no sensory deficits.  ED Results / Procedures / Treatments    Procedures Procedures    Medications Ordered in ED Medications - No data to display  ED Course/ Medical Decision Making/ A&P                                 Medical Decision Making  Cephalgia, possibly from cervical radiculopathy of C1 or C2.  However, description is atypical and no objective findings of radiculopathy.  I have reassured the patient that there is no lump in his skull.  I have recommended he apply ice, use over-the-counter NSAIDs and acetaminophen  as needed for pain.  I am referring him to  neurology for follow-up.  Final Clinical Impression(s) / ED Diagnoses Final diagnoses:  Nonintractable headache, unspecified chronicity pattern, unspecified headache type    Rx / DC Orders ED Discharge Orders     None         Raford Lenis, MD 06/07/23 [phone]

## 2023-06-16 ENCOUNTER — Other Ambulatory Visit: Payer: Self-pay | Admitting: *Deleted

## 2023-06-16 DIAGNOSIS — R079 Chest pain, unspecified: Secondary | ICD-10-CM

## 2023-06-17 ENCOUNTER — Ambulatory Visit: Payer: Medicaid Other

## 2023-06-17 ENCOUNTER — Encounter: Payer: Self-pay | Admitting: *Deleted

## 2023-06-17 NOTE — Progress Notes (Unsigned)
Patient came to [address] office today for a POET.  He had T wave inversion inferior and V3-V6 on his resting ECG.  ECG reviewed with Dr. Nelly Laurence.  It was considered best to cancel todays POET because of the possibility of a false positive/ non- diagnostic test. Patient has an appointment tomorrow with Dr. Royann Shivers.  We will defer the decision of future stress testing options to him. Patient given a copy of todays ECG to show at tomorrows appointment.

## 2023-06-18 ENCOUNTER — Ambulatory Visit: Payer: Medicaid Other | Admitting: Cardiovascular Disease

## 2023-06-21 ENCOUNTER — Encounter: Payer: Self-pay | Admitting: Internal Medicine

## 2023-06-25 ENCOUNTER — Ambulatory Visit: Payer: Medicaid Other | Attending: Cardiology | Admitting: Cardiology

## 2023-06-28 ENCOUNTER — Other Ambulatory Visit: Payer: Self-pay

## 2023-06-28 ENCOUNTER — Emergency Department (HOSPITAL_COMMUNITY)
Admission: EM | Admit: 2023-06-28 | Discharge: 2023-06-28 | Discharge status: Against Medical Advice | Payer: Medicaid Other | Attending: Emergency Medicine | Admitting: Emergency Medicine

## 2023-06-28 ENCOUNTER — Encounter (HOSPITAL_COMMUNITY): Payer: Self-pay

## 2023-06-28 ENCOUNTER — Emergency Department (HOSPITAL_COMMUNITY): Payer: Medicaid Other

## 2023-06-28 ENCOUNTER — Emergency Department (HOSPITAL_COMMUNITY)
Admission: EM | Admit: 2023-06-28 | Discharge: 2023-06-29 | Disposition: A | Discharge status: Home / Self Care | Payer: Medicaid Other | Source: Home / Self Care | Attending: Emergency Medicine | Admitting: Emergency Medicine

## 2023-06-28 DIAGNOSIS — R0789 Other chest pain: Secondary | ICD-10-CM | POA: Insufficient documentation

## 2023-06-28 DIAGNOSIS — R079 Chest pain, unspecified: Secondary | ICD-10-CM | POA: Diagnosis present

## 2023-06-28 DIAGNOSIS — Z5321 Procedure and treatment not carried out due to patient leaving prior to being seen by health care provider: Secondary | ICD-10-CM | POA: Diagnosis not present

## 2023-06-28 DIAGNOSIS — Z79899 Other long term (current) drug therapy: Secondary | ICD-10-CM | POA: Insufficient documentation

## 2023-06-28 DIAGNOSIS — R002 Palpitations: Secondary | ICD-10-CM | POA: Diagnosis present

## 2023-06-28 DIAGNOSIS — J45909 Unspecified asthma, uncomplicated: Secondary | ICD-10-CM | POA: Insufficient documentation

## 2023-06-28 LAB — COMPREHENSIVE METABOLIC PANEL
ALT: 13 U/L (ref 0–44)
AST: 18 U/L (ref 15–41)
Albumin: 4.3 g/dL (ref 3.5–5.0)
Alkaline Phosphatase: 42 U/L (ref 38–126)
Anion gap: 9 (ref 5–15)
BUN: 15 mg/dL (ref 6–20)
CO2: 21 mmol/L — ABNORMAL LOW (ref 22–32)
Calcium: 9.2 mg/dL (ref 8.9–10.3)
Chloride: 107 mmol/L (ref 98–111)
Creatinine, Ser: 0.99 mg/dL (ref 0.61–1.24)
GFR, Estimated: >60 mL/min (ref >60)
Glucose, Bld: 101 mg/dL — ABNORMAL HIGH (ref 70–99)
Potassium: 3.4 mmol/L — ABNORMAL LOW (ref 3.5–5.1)
Sodium: 137 mmol/L (ref 135–145)
Total Bilirubin: 0.6 mg/dL (ref 0.0–1.2)
Total Protein: 7.1 g/dL (ref 6.5–8.1)

## 2023-06-28 LAB — CBC WITH DIFFERENTIAL/PLATELET
Abs Immature Granulocytes: 0.01 10*3/uL (ref 0.00–0.07)
Basophils Absolute: 0 10*3/uL (ref 0.0–0.1)
Basophils Relative: 1 %
Eosinophils Absolute: 0.2 10*3/uL (ref 0.0–0.5)
Eosinophils Relative: 4 %
HCT: 43.5 % (ref 39.0–52.0)
Hemoglobin: 15 g/dL (ref 13.0–17.0)
Immature Granulocytes: 0 %
Lymphocytes Relative: 36 %
Lymphs Abs: 1.3 10*3/uL (ref 0.7–4.0)
MCH: 29.2 pg (ref 26.0–34.0)
MCHC: 34.5 g/dL (ref 30.0–36.0)
MCV: 84.6 fL (ref 80.0–100.0)
Monocytes Absolute: 0.3 10*3/uL (ref 0.1–1.0)
Monocytes Relative: 8 %
Neutro Abs: 1.9 10*3/uL (ref 1.7–7.7)
Neutrophils Relative %: 51 %
Platelets: 266 10*3/uL (ref 150–400)
RBC: 5.14 MIL/uL (ref 4.22–5.81)
RDW: 12.3 % (ref 11.5–15.5)
WBC: 3.7 10*3/uL — ABNORMAL LOW (ref 4.0–10.5)
nRBC: 0 % (ref 0.0–0.2)

## 2023-06-28 LAB — TROPONIN I (HIGH SENSITIVITY)
Troponin I (High Sensitivity): 10 ng/L (ref <18)
Troponin I (High Sensitivity): 11 ng/L (ref <18)

## 2023-06-28 LAB — TSH: TSH: 0.734 u[IU]/mL (ref 0.350–4.500)

## 2023-06-28 LAB — LIPASE, BLOOD: Lipase: 38 U/L (ref 11–51)

## 2023-06-28 NOTE — ED Provider Triage Note (Signed)
Emergency Medicine Provider Triage Evaluation Note  Tracy [name] , a 28 y.o. male  was evaluated in triage.  Pt complains of left-sided chest pain that began today.  Patient states this began after he had a smoothie from [place] and wants any pain initially but then left without being seen.  Patient then went to his primary care provider at [facility] and was told to be evaluated for chest pain as he had an abnormal EKG.  Patient has cardiac history.  Patient does have history of anxiety has been taking his Klonopin but is unsure if his chest pain is anxiety related.  Patient denies any shortness of breath, fevers, leg swelling.  Review of Systems  Positive:  Negative:   Physical Exam  There were no vitals taken for this visit. Gen:   Awake, no distress   Resp:  Normal effort  MSK:   Moves extremities without difficulty  Other:  Anxious on exam  Medical Decision Making  Medically screening exam initiated at 6:55 PM.  Appropriate orders placed.  [name] was informed that the remainder of the evaluation will be completed by another provider, this initial triage assessment does not replace that evaluation, and the importance of remaining in the ED until their evaluation is complete.  Workup initiated, patient stable at this time.   Kiondre, Grenz, PA-C 06/28/23 1856

## 2023-06-28 NOTE — ED Notes (Signed)
Pt reports Hx of anxiety. Pt unsure what is causing chest discomfort.

## 2023-06-28 NOTE — ED Notes (Signed)
While conducting Triage, Pt reports he has his sisters car and needs to leave. Pt verbalized he was feeling better and reports he is going to go buy Aspirin on his way home. Pt verbalized understanding of leaving without being seen.

## 2023-06-28 NOTE — ED Triage Notes (Signed)
Patient reports chest pain and palpitations x 2 days. Went to PCP and was sent here for abnormal EKG.

## 2023-06-28 NOTE — ED Triage Notes (Signed)
Pt arrived via POV reporting he developed chest pain after drinking a smoothie he picked up from [place]. Pt reports he stopped drinking the smoothie and symptoms "eased up."

## 2023-06-29 MED ORDER — ACETAMINOPHEN 500 MG PO TABS
1000.0000 mg | ORAL_TABLET | Freq: Once | ORAL | Status: AC
  Administered 2023-06-29: 1000 mg via ORAL
  Filled 2023-06-29: qty 2
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
## 2023-07-01 ENCOUNTER — Encounter (HOSPITAL_COMMUNITY): Payer: Self-pay

## 2023-07-01 ENCOUNTER — Emergency Department (HOSPITAL_COMMUNITY): Payer: Medicaid Other

## 2023-07-01 ENCOUNTER — Emergency Department (HOSPITAL_COMMUNITY)
Admission: EM | Admit: 2023-07-01 | Discharge: 2023-07-01 | Disposition: A | Discharge status: Home / Self Care | Payer: Medicaid Other | Attending: Emergency Medicine | Admitting: Emergency Medicine

## 2023-07-01 ENCOUNTER — Other Ambulatory Visit: Payer: Self-pay

## 2023-07-01 DIAGNOSIS — J069 Acute upper respiratory infection, unspecified: Secondary | ICD-10-CM | POA: Insufficient documentation

## 2023-07-01 DIAGNOSIS — J45909 Unspecified asthma, uncomplicated: Secondary | ICD-10-CM | POA: Insufficient documentation

## 2023-07-01 DIAGNOSIS — D72819 Decreased white blood cell count, unspecified: Secondary | ICD-10-CM | POA: Insufficient documentation

## 2023-07-01 DIAGNOSIS — M940 Chondrocostal junction syndrome [Tietze]: Secondary | ICD-10-CM | POA: Insufficient documentation

## 2023-07-01 DIAGNOSIS — R002 Palpitations: Secondary | ICD-10-CM | POA: Diagnosis present

## 2023-07-01 LAB — BASIC METABOLIC PANEL
Anion gap: 11 (ref 5–15)
BUN: 12 mg/dL (ref 6–20)
CO2: 27 mmol/L (ref 22–32)
Calcium: 9.4 mg/dL (ref 8.9–10.3)
Chloride: 103 mmol/L (ref 98–111)
Creatinine, Ser: 0.91 mg/dL (ref 0.61–1.24)
GFR, Estimated: >60 mL/min (ref >60)
Glucose, Bld: 95 mg/dL (ref 70–99)
Potassium: 3.6 mmol/L (ref 3.5–5.1)
Sodium: 141 mmol/L (ref 135–145)

## 2023-07-01 LAB — CBC
HCT: 43.5 % (ref 39.0–52.0)
Hemoglobin: 15.4 g/dL (ref 13.0–17.0)
MCH: 30 pg (ref 26.0–34.0)
MCHC: 35.4 g/dL (ref 30.0–36.0)
MCV: 84.6 fL (ref 80.0–100.0)
Platelets: 268 10*3/uL (ref 150–400)
RBC: 5.14 MIL/uL (ref 4.22–5.81)
RDW: 12.6 % (ref 11.5–15.5)
WBC: 3.2 10*3/uL — ABNORMAL LOW (ref 4.0–10.5)
nRBC: 0 % (ref 0.0–0.2)

## 2023-07-01 LAB — TROPONIN I (HIGH SENSITIVITY)
Troponin I (High Sensitivity): 26 ng/L — ABNORMAL HIGH (ref <18)
Troponin I (High Sensitivity): 22 ng/L — ABNORMAL HIGH (ref <18)

## 2023-07-01 MED ORDER — POTASSIUM CHLORIDE CRYS ER 20 MEQ PO TBCR
40.0000 meq | EXTENDED_RELEASE_TABLET | Freq: Once | ORAL | Status: AC
  Administered 2023-07-01: 40 meq via ORAL
  Filled 2023-07-01: qty 2

## 2023-07-01 MED ORDER — KETOROLAC TROMETHAMINE 15 MG/ML IJ SOLN
15.0000 mg | Freq: Once | INTRAMUSCULAR | Status: AC
  Administered 2023-07-01: 15 mg via INTRAMUSCULAR
  Filled 2023-07-01: qty 1

## 2023-07-01 NOTE — ED Provider Triage Note (Signed)
Emergency Medicine Provider Triage Evaluation Note  PADRAIC MARINOS , a 28 y.o. male  was evaluated in triage.  Pt complains of chest pain.  Review of Systems  Positive: Palpitations,  Negative: Cough, fever  Physical Exam  There were no vitals taken for this visit. Gen:   Awake, no distress   Resp:  Normal effort  MSK:   Moves extremities without difficulty  Other:    Medical Decision Making  Medically screening exam initiated at 2:49 PM.  Appropriate orders placed.  [name] was informed that the remainder of the evaluation will be completed by another provider, this initial triage assessment does not replace that evaluation, and the importance of remaining in the ED until their evaluation is complete.  Ongoing chest pain, left sided, palpitations. Seen at [facility] 2 days ago, full exam/troponins/xray.    Elpidio Anis, PA-C 07/01/23 1452

## 2023-07-01 NOTE — ED Provider Notes (Signed)
[place] EMERGENCY DEPARTMENT AT [facility] Provider Note   CSN: [id number] Arrival date & time: 07/01/23  1411     History  Chief Complaint  Patient presents with   Chest Pain    Tracy [name] is a 28 y.o. male with PMHx anxiety, asthma, bronchitis who presents to ED concerned for an episode of lightheadedness and palpitations earlier this morning when he stood out of bed. Patient currently with mild chest discomfort that he is having a difficult time describing the feeling. Patient also stating that he had left sided chest pain for a few seconds last night which has since resolved. Patient stating that his anxiety and acid reflux is making his symptoms worse - but declining a GI cocktail in ED today. Patient also diagnosed with bronchitis and discharged with doxycycline 3-4 days ago which he has been complaint with.  Denies fever, dyspnea, nausea, vomiting, diarrhea, dysuria, hematuria, hematochezia.    Chest Pain Associated symptoms: palpitations        Home Medications Prior to Admission medications   Medication Sig Start Date End Date Taking? Authorizing Provider  budesonide-formoterol (SYMBICORT) 80-4.5 MCG/ACT inhaler Inhale 2 puffs into the lungs 2 (two) times daily as needed (Asthma).    [provider]  clonazePAM (KLONOPIN) 0.5 MG tablet Take 0.5 mg by mouth daily. 06/05/23   [provider]  famotidine (PEPCID) 20 MG tablet Take 20 mg by mouth 2 (two) times daily. 05/19/23   [provider]  hydrOXYzine (ATARAX) 25 MG tablet Take 1 tablet (25 mg total) by mouth every 6 (six) hours. Patient taking differently: Take 25 mg by mouth 3 (three) times daily. 05/07/23   Geoffery Lyons, MD  LORazepam (ATIVAN) 0.5 MG tablet Take 1 tablet (0.5 mg total) by mouth 3 (three) times daily as needed for anxiety. 05/17/23   Elpidio Anis, PA-C  QUEtiapine (SEROQUEL) 25 MG tablet Take 25 mg by mouth at bedtime. 05/29/23   [provider]       Allergies    Penicillins    Review of Systems   Review of Systems  Cardiovascular:  Positive for palpitations.    Physical Exam Updated Vital Signs BP (!) 140/94   Pulse 67   Temp 98.7 F (37.1 C) (Oral)   Resp 16   Ht 6\' 1"  (1.854 m)   Wt 95.7 kg   SpO2 100%   BMI 27.84 kg/m  Physical Exam Vitals and nursing note reviewed.  Constitutional:      General: He is not in acute distress.    Appearance: He is not ill-appearing, toxic-appearing or diaphoretic.  HENT:     Head: Normocephalic and atraumatic.     Mouth/Throat:     Mouth: Mucous membranes are moist.     Pharynx: No oropharyngeal exudate or posterior oropharyngeal erythema.  Eyes:     General: No scleral icterus.       Right eye: No discharge.        Left eye: No discharge.     Conjunctiva/sclera: Conjunctivae normal.  Cardiovascular:     Rate and Rhythm: Normal rate and regular rhythm.     Pulses: Normal pulses.     Heart sounds: Normal heart sounds. No murmur heard. Pulmonary:     Effort: Pulmonary effort is normal. No respiratory distress.     Breath sounds: Normal breath sounds. No wheezing, rhonchi or rales.  Chest:       Comments: Tenderness to palpation of anterior chest where patient's symptoms  are occuring Abdominal:     General: Bowel sounds are normal.     Palpations: Abdomen is soft. There is no mass.     Tenderness: There is no abdominal tenderness.  Musculoskeletal:     Right lower leg: No edema.     Left lower leg: No edema.  Skin:    General: Skin is warm and dry.     Findings: No rash.  Neurological:     General: No focal deficit present.     Mental Status: He is alert and oriented to person, place, and time. Mental status is at baseline.  Psychiatric:        Mood and Affect: Mood normal.     ED Results / Procedures / Treatments   Labs (all labs ordered are listed, but only abnormal results are displayed) Labs Reviewed  CBC - Abnormal; Notable for the following components:       Result Value   WBC 3.2 (*)    All other components within normal limits  TROPONIN I (HIGH SENSITIVITY) - Abnormal; Notable for the following components:   Troponin I (High Sensitivity) 26 (*)    All other components within normal limits  TROPONIN I (HIGH SENSITIVITY) - Abnormal; Notable for the following components:   Troponin I (High Sensitivity) 22 (*)    All other components within normal limits  BASIC METABOLIC PANEL    EKG EKG Interpretation Date/Time:  Thursday July 01 2023 14:56:17 EST Ventricular Rate:  68 PR Interval:  156 QRS Duration:  82 QT Interval:  366 QTC Calculation: 389 R Axis:   58  Text Interpretation: Normal sinus rhythm with sinus arrhythmia T wave abnormality, consider inferior ischemia Abnormal ECG When compared with ECG of 28-Jun-2023 18:55, No significant change since last tracing Confirmed by Meridee Score [phone]) on 07/01/2023 3:13:47 PM  Radiology DG Chest 2 View Result Date: 07/01/2023 CLINICAL DATA:  chest pain EXAM: CHEST - 2 VIEW COMPARISON:  Chest x-ray 06/28/2023 FINDINGS: The heart and mediastinal contours are within normal limits. No focal consolidation. No pulmonary edema. No pleural effusion. No pneumothorax. No acute osseous abnormality. IMPRESSION: No active cardiopulmonary disease. Electronically Signed   By: Tish Frederickson M.D.   On: 07/01/2023 17:18    Procedures Procedures    Medications Ordered in ED Medications  ketorolac (TORADOL) 15 MG/ML injection 15 mg (15 mg Intramuscular Given 07/01/23 1704)  potassium chloride SA (KLOR-CON M) CR tablet 40 mEq (40 mEq Oral Given 07/01/23 1833)    ED Course/ Medical Decision Making/ A&P                                 Medical Decision Making Amount and/or Complexity of Data Reviewed Labs: ordered. Radiology: ordered.  Risk Prescription drug management.   This patient presents to the ED for concern of chest pain, this involves an extensive number of treatment options, and  is a complaint that carries with it a high risk of complications and morbidity.  The differential diagnosis includes acute coronary syndrome, congestive heart failure, pericarditis, pneumonia, pulmonary embolism, tension pneumothorax, esophageal rupture, aortic dissection, cardiac tamponade, musculoskeletal   Co morbidities that complicate the patient evaluation  anxiety, asthma, bronchitis   Additional history obtained:  Additional history obtained from recent ECHO unremarkable with 55-60% EF    Problem List / ED Course / Critical interventions / Medication management  Patient presented for chest discomfort and an episode of palpations and  lightheadedness when he stood up out of bed this morning. Physical exam with tenderness to palpation of the area where patient is experiencing his mild chest discomfort. Symptoms were relieved with toradol. Rest of physical exam reassuring. Patient afebrile with stable vitals.  I Ordered, and personally interpreted labs.  Initial troponin mildly elevated at 26 which downtrended to 22.  CBC without leukocytosis or anemia.  BMP reassuring.  The patient was maintained on a cardiac monitor.  I personally viewed and interpreted the EKG/cardiac monitored which showed an underlying rhythm of: sinus rhythm. I ordered imaging studies including chest xray to assess for process contributing to patient's symptoms. I independently visualized and interpreted imaging which showed no acute cardiopulmonary disease. I agree with the radiologist interpretation Patient with cardiology follow up after an ED visit last month where he had elevated troponins. Patient had an echo at that time which was clear. Recommended patient follow up again with his outpatient cardiologist which he verbalized understanding of. Patient recently had holter monitor that he just finished and waiting to hear results for that. Patient agrees to call cardiologist first thing in the morning for close  follow up. Patient without SOB/dyspnea, tachycardia, or hypoxia. Patient's symptoms also resolving with Toradol. His symptoms are also currently very mild, so I am less concerned about PE vs other emergent cardiac conditions at this time. It appears that patient's symptoms are dt costochondritis given that his pain is reproducible to palpation along with his recent diagnosis of bronchitis. I have reviewed the patients home medicines and have made adjustments as needed Patient was given return precautions. Patient stable for discharge at this time.  Patient verbalized understanding of plan.  Ddx:  These are considered less likely due to history of present illness and physical exam findings.  -Acute coronary syndrome: EKG and troponins reassuring -Congestive heart failure: patient denies orthopnea, cough, and leg edema -Pneumonia: lungs are clear to auscultation bilaterally -Pulmonary embolism: vitals stable -Pneumothorax: lungs are clear to auscultation bilaterally -Esophageal rupture: patient denies vomiting, heavy drinking, and hx of GERD -Aortic dissection: vital signs are stable, no variation in pulse pressure -Cardiac tamponade: absence of hypotension, JVD, and muffled heart sounds   Social Determinants of Health:  none         Final Clinical Impression(s) / ED Diagnoses Final diagnoses:  Costochondritis  Viral upper respiratory tract infection    Rx / DC Orders ED Discharge Orders     None         Dorthy Cooler, [place] 07/02/23 1250    Bethann Berkshire, MD 07/04/23 1012

## 2023-07-01 NOTE — Discharge Instructions (Addendum)
It was a pleasure caring for you today. Workup today was reassuring. Please follow up with your primary care provider. Seek emergency care if experiencing any new or worsening symptoms

## 2023-07-01 NOTE — ED Triage Notes (Signed)
Pt arrived via POV c/o chest discomfort that Pt reports began after he woke up. Pt verbalizes he is unable to describe the pain.

## 2023-07-05 ENCOUNTER — Emergency Department (HOSPITAL_COMMUNITY)
Admission: EM | Admit: 2023-07-05 | Discharge: 2023-07-05 | Disposition: A | Discharge status: Home / Self Care | Payer: MEDICAID | Attending: Emergency Medicine | Admitting: Emergency Medicine

## 2023-07-05 ENCOUNTER — Other Ambulatory Visit: Payer: Self-pay

## 2023-07-05 ENCOUNTER — Encounter (HOSPITAL_COMMUNITY): Payer: Self-pay | Admitting: Emergency Medicine

## 2023-07-05 DIAGNOSIS — I10 Essential (primary) hypertension: Secondary | ICD-10-CM | POA: Insufficient documentation

## 2023-07-05 DIAGNOSIS — R0789 Other chest pain: Secondary | ICD-10-CM | POA: Diagnosis present

## 2023-07-05 DIAGNOSIS — R079 Chest pain, unspecified: Secondary | ICD-10-CM

## 2023-07-05 NOTE — Discharge Instructions (Signed)
Your EKG is unchanged Your  blood pressure is elevated - your doctor needs to address this with you int he clinic this week - additionally - the dose of the cholesterol medicine that they gave you was a higher dose than normal -please ask them if they feel strongly about you taking such a high dose.  I would recommend you see the cardiologist in the office, please see their phone number above,  Thank you for allowing [place] to treat you in the emergency department today.  After reviewing your examination and potential testing that was done it appears that you are safe to go home.  I would like for you to follow-up with your doctor within the next several days, have them obtain your records and follow-up with them to review all potential tests and results from your visit.  If you should develop severe or worsening symptoms return to the emergency department immediately

## 2023-07-05 NOTE — ED Provider Notes (Signed)
[place] EMERGENCY DEPARTMENT AT [facility] Provider Note   CSN: [id number] Arrival date & time: 07/05/23  1839     History  Chief Complaint  Patient presents with   Chest Pain    Tracy [name] is a 28 y.o. male.   Chest Pain This patient is a 28 year old male recently diagnosed with anxiety and placed on clonazepam, hydroxyzine and Seroquel, the patient had recently been on Ativan as well.  He had a recent evaluation in the emergency department approximately 3 or 4 days ago because of some chest discomfort, it was thought to be related to a postinfectious costochondritis or bronchitis and the patient has been taking doxycycline.  He notes that every time he eats a meal he feels like his heart races, that again occurred tonight with a weird feeling in the left upper chest with some radiation into the left arm.  He reports that at this time he does not feel any of those symptoms and feels totally normal.  He denies swelling of his legs, denies any exertional symptoms, he has not been having any fevers and he does not feel short of breath.  The patient was just seen at [facility] and started on 80 mg of atorvastatin for what he was told was high cholesterol.  He has never been on this medication before.  I asked him to talk to his doctor about the dosages that seems high to start on.  I do not have his lab work to give any further evaluation on that.  He has not taken his first dose up to this point     Home Medications Prior to Admission medications   Medication Sig Start Date End Date Taking? Authorizing Provider  budesonide-formoterol (SYMBICORT) 80-4.5 MCG/ACT inhaler Inhale 2 puffs into the lungs 2 (two) times daily as needed (Asthma).    [provider]  clonazePAM (KLONOPIN) 0.5 MG tablet Take 0.5 mg by mouth daily. 06/05/23   [provider]  famotidine (PEPCID) 20 MG tablet Take 20 mg by mouth 2 (two) times daily. 05/19/23   [provider]  hydrOXYzine (ATARAX) 25 MG tablet Take 1 tablet (25 mg total) by mouth every 6 (six) hours. Patient taking differently: Take 25 mg by mouth 3 (three) times daily. 05/07/23   Geoffery Lyons, MD  LORazepam (ATIVAN) 0.5 MG tablet Take 1 tablet (0.5 mg total) by mouth 3 (three) times daily as needed for anxiety. 05/17/23   Elpidio Anis, PA-C  QUEtiapine (SEROQUEL) 25 MG tablet Take 25 mg by mouth at bedtime. 05/29/23   [provider]      Allergies    Penicillins    Review of Systems   Review of Systems  Cardiovascular:  Positive for chest pain.  All other systems reviewed and are negative.   Physical Exam Updated Vital Signs BP (!) 158/110   Pulse 70   Temp 98.8 F (37.1 C) (Oral)   Resp 16   Ht 1.854 m (6\' 1" )   Wt 95 kg   SpO2 100%   BMI 27.63 kg/m  Physical Exam Vitals and nursing note reviewed.  Constitutional:      General: He is not in acute distress.    Appearance: He is well-developed.  HENT:     Head: Normocephalic and atraumatic.     Mouth/Throat:     Pharynx: No oropharyngeal exudate.  Eyes:     General: No scleral icterus.       Right eye: No  discharge.        Left eye: No discharge.     Conjunctiva/sclera: Conjunctivae normal.     Pupils: Pupils are equal, round, and reactive to light.  Neck:     Thyroid: No thyromegaly.     Vascular: No JVD.  Cardiovascular:     Rate and Rhythm: Normal rate and regular rhythm.     Heart sounds: Normal heart sounds. No murmur heard.    No friction rub. No gallop.  Pulmonary:     Effort: Pulmonary effort is normal. No respiratory distress.     Breath sounds: Normal breath sounds. No wheezing or rales.  Chest:     Chest wall: Tenderness present.  Abdominal:     General: Bowel sounds are normal. There is no distension.     Palpations: Abdomen is soft. There is no mass.     Tenderness: There is no abdominal tenderness.  Musculoskeletal:        General: No tenderness. Normal range of motion.     Cervical  back: Normal range of motion and neck supple.  Lymphadenopathy:     Cervical: No cervical adenopathy.  Skin:    General: Skin is warm and dry.     Findings: No erythema or rash.  Neurological:     Mental Status: He is alert.     Coordination: Coordination normal.  Psychiatric:        Behavior: Behavior normal.     ED Results / Procedures / Treatments   Labs (all labs ordered are listed, but only abnormal results are displayed) Labs Reviewed - No data to display  EKG EKG Interpretation Date/Time:  Monday July 05 2023 18:52:20 EST Ventricular Rate:  66 PR Interval:  142 QRS Duration:  82 QT Interval:  360 QTC Calculation: 377 R Axis:   46  Text Interpretation: Normal sinus rhythm with sinus arrhythmia ST & T wave abnormality, consider inferior ischemia ST & T wave abnormality, consider anterolateral ischemia Abnormal ECG When compared with ECG of 01-Jul-2023 17:09, PREVIOUS ECG IS PRESENT no change Confirmed by Eber Hong ([id number]) on 07/05/2023 6:54:48 PM  Radiology No results found.  Procedures Procedures    Medications Ordered in ED Medications - No data to display  ED Course/ Medical Decision Making/ A&P                                 Medical Decision Making  The patient's EKG is unchanged from multiple prior EKGs.  He has no new symptoms, his blood pressure is slightly elevated here, he is not febrile he is not tachycardic he has no murmurs, lung sounds are unremarkable and he has some reproducible tenderness over the left chest.  I have reviewed the chest x-ray that he had on the 30th as well as the 27th and even going back to December a couple of times when he presented with similar symptoms, there is no acute findings  I do not think the patient needs any lab work, this does not seem to be related to cholecystitis, pancreatitis and he does not have any other site findings concerning for acute ischemia, his lab work including 2 troponins from several days ago  were both negative.  There was no significant change or delta change.  The patient is agreeable to close follow-up, this may well be related to anxiety        Final Clinical Impression(s) / ED Diagnoses Final diagnoses:  Left-sided chest pain  Essential hypertension    Rx / DC Orders ED Discharge Orders     None         Eber Hong, MD 07/05/23 2124

## 2023-07-05 NOTE — ED Notes (Signed)
Pt arrived via POV c/o chest pain radiating down his left arm that began after he ate today. Pt reports Hx of anxiety.

## 2023-07-05 NOTE — ED Triage Notes (Signed)
Pt reports he woke up feeling anxious and having left sided chest pain and his right arm feels colder than right arm.States he was seen here for same recently and told URI.

## 2023-07-20 ENCOUNTER — Emergency Department (HOSPITAL_COMMUNITY)
Admission: EM | Admit: 2023-07-20 | Discharge: 2023-07-21 | Discharge status: Against Medical Advice | Payer: MEDICAID | Attending: Emergency Medicine | Admitting: Emergency Medicine

## 2023-07-20 ENCOUNTER — Other Ambulatory Visit: Payer: Self-pay

## 2023-07-20 ENCOUNTER — Emergency Department (HOSPITAL_COMMUNITY): Payer: MEDICAID

## 2023-07-20 DIAGNOSIS — R0789 Other chest pain: Secondary | ICD-10-CM | POA: Diagnosis present

## 2023-07-20 DIAGNOSIS — R0602 Shortness of breath: Secondary | ICD-10-CM | POA: Diagnosis not present

## 2023-07-20 DIAGNOSIS — Z5321 Procedure and treatment not carried out due to patient leaving prior to being seen by health care provider: Secondary | ICD-10-CM | POA: Insufficient documentation

## 2023-07-20 DIAGNOSIS — R42 Dizziness and giddiness: Secondary | ICD-10-CM | POA: Insufficient documentation

## 2023-07-20 LAB — CBC
HCT: 44 % (ref 39.0–52.0)
Hemoglobin: 15.6 g/dL (ref 13.0–17.0)
MCH: 29.8 pg (ref 26.0–34.0)
MCHC: 35.5 g/dL (ref 30.0–36.0)
MCV: 84 fL (ref 80.0–100.0)
Platelets: 335 10*3/uL (ref 150–400)
RBC: 5.24 MIL/uL (ref 4.22–5.81)
RDW: 12.5 % (ref 11.5–15.5)
WBC: 2.8 10*3/uL — ABNORMAL LOW (ref 4.0–10.5)
nRBC: 0 % (ref 0.0–0.2)

## 2023-07-20 LAB — BASIC METABOLIC PANEL
Anion gap: 11 (ref 5–15)
BUN: 9 mg/dL (ref 6–20)
CO2: 28 mmol/L (ref 22–32)
Calcium: 9.6 mg/dL (ref 8.9–10.3)
Chloride: 103 mmol/L (ref 98–111)
Creatinine, Ser: 1.06 mg/dL (ref 0.61–1.24)
GFR, Estimated: >60 mL/min (ref >60)
Glucose, Bld: 95 mg/dL (ref 70–99)
Potassium: 3.7 mmol/L (ref 3.5–5.1)
Sodium: 142 mmol/L (ref 135–145)

## 2023-07-20 LAB — TROPONIN I (HIGH SENSITIVITY): Troponin I (High Sensitivity): 5 ng/L (ref <18)

## 2023-07-20 NOTE — ED Triage Notes (Signed)
Patient reports left chest tightness with mild SOB and lightheadedness this evening . Denies emesis or diaphoresis .

## 2023-07-20 NOTE — ED Provider Triage Note (Signed)
Emergency Medicine Provider Triage Evaluation Note  Tracy [name] , a 28 y.o. male  was evaluated in triage.  Pt complains of chest pain.  Started 30 minutes ago.  In the left chest with shortness of breath.  Recent evaluation for chest pain at [facility] ED.   Review of Systems  Positive: See above Negative: See above  Physical Exam  BP 136/86 (BP Location: Right Arm)   Pulse 72   Temp 98.6 F (37 C)   Resp 20   SpO2 100%  Gen:   Awake, no distress   Resp:  Normal effort  MSK:   Moves extremities without difficulty  Other:    Medical Decision Making  Medically screening exam initiated at 8:02 PM.  Appropriate orders placed.  [name] was informed that the remainder of the evaluation will be completed by another provider, this initial triage assessment does not replace that evaluation, and the importance of remaining in the ED until their evaluation is complete.  Work up started   Gareth Eagle, Cordelia Poche 07/20/23 2003

## 2023-07-21 LAB — TROPONIN I (HIGH SENSITIVITY): Troponin I (High Sensitivity): 5 ng/L (ref <18)

## 2023-07-21 MED ORDER — ACETAMINOPHEN 325 MG PO TABS
650.0000 mg | ORAL_TABLET | Freq: Once | ORAL | Status: AC
  Administered 2023-07-21: 650 mg via ORAL
  Filled 2023-07-21: qty 2

## 2023-07-21 NOTE — ED Notes (Signed)
Patient decided to leave AMA

## 2023-07-24 ENCOUNTER — Other Ambulatory Visit: Payer: Self-pay

## 2023-07-24 ENCOUNTER — Emergency Department (HOSPITAL_COMMUNITY): Payer: MEDICAID

## 2023-07-24 ENCOUNTER — Emergency Department (HOSPITAL_COMMUNITY)
Admission: EM | Admit: 2023-07-24 | Discharge: 2023-07-24 | Disposition: A | Discharge status: Home / Self Care | Payer: MEDICAID | Attending: Emergency Medicine | Admitting: Emergency Medicine

## 2023-07-24 DIAGNOSIS — R079 Chest pain, unspecified: Secondary | ICD-10-CM | POA: Insufficient documentation

## 2023-07-24 DIAGNOSIS — R002 Palpitations: Secondary | ICD-10-CM | POA: Insufficient documentation

## 2023-07-24 DIAGNOSIS — F410 Panic disorder [episodic paroxysmal anxiety] without agoraphobia: Secondary | ICD-10-CM | POA: Diagnosis present

## 2023-07-24 DIAGNOSIS — F419 Anxiety disorder, unspecified: Secondary | ICD-10-CM

## 2023-07-24 LAB — COMPREHENSIVE METABOLIC PANEL
ALT: 21 U/L (ref 0–44)
AST: 20 U/L (ref 15–41)
Albumin: 4.1 g/dL (ref 3.5–5.0)
Alkaline Phosphatase: 40 U/L (ref 38–126)
Anion gap: 7 (ref 5–15)
BUN: 14 mg/dL (ref 6–20)
CO2: 27 mmol/L (ref 22–32)
Calcium: 9.1 mg/dL (ref 8.9–10.3)
Chloride: 105 mmol/L (ref 98–111)
Creatinine, Ser: 0.88 mg/dL (ref 0.61–1.24)
GFR, Estimated: >60 mL/min (ref >60)
Glucose, Bld: 90 mg/dL (ref 70–99)
Potassium: 3.3 mmol/L — ABNORMAL LOW (ref 3.5–5.1)
Sodium: 139 mmol/L (ref 135–145)
Total Bilirubin: 0.6 mg/dL (ref 0.0–1.2)
Total Protein: 6.6 g/dL (ref 6.5–8.1)

## 2023-07-24 LAB — CBC WITH DIFFERENTIAL/PLATELET
Abs Immature Granulocytes: 0 10*3/uL (ref 0.00–0.07)
Basophils Absolute: 0 10*3/uL (ref 0.0–0.1)
Basophils Relative: 1 %
Eosinophils Absolute: 0.2 10*3/uL (ref 0.0–0.5)
Eosinophils Relative: 5 %
HCT: 41.3 % (ref 39.0–52.0)
Hemoglobin: 14.6 g/dL (ref 13.0–17.0)
Immature Granulocytes: 0 %
Lymphocytes Relative: 52 %
Lymphs Abs: 2.1 10*3/uL (ref 0.7–4.0)
MCH: 29.9 pg (ref 26.0–34.0)
MCHC: 35.4 g/dL (ref 30.0–36.0)
MCV: 84.5 fL (ref 80.0–100.0)
Monocytes Absolute: 0.4 10*3/uL (ref 0.1–1.0)
Monocytes Relative: 9 %
Neutro Abs: 1.4 10*3/uL — ABNORMAL LOW (ref 1.7–7.7)
Neutrophils Relative %: 33 %
Platelets: 270 10*3/uL (ref 150–400)
RBC: 4.89 MIL/uL (ref 4.22–5.81)
RDW: 12.7 % (ref 11.5–15.5)
WBC: 4.1 10*3/uL (ref 4.0–10.5)
nRBC: 0 % (ref 0.0–0.2)

## 2023-07-24 LAB — TROPONIN I (HIGH SENSITIVITY)
Troponin I (High Sensitivity): 12 ng/L (ref <18)
Troponin I (High Sensitivity): 10 ng/L (ref <18)

## 2023-07-24 MED ORDER — POTASSIUM CHLORIDE CRYS ER 20 MEQ PO TBCR
40.0000 meq | EXTENDED_RELEASE_TABLET | Freq: Once | ORAL | Status: AC
  Administered 2023-07-24: 40 meq via ORAL
  Filled 2023-07-24: qty 2

## 2023-07-24 MED ORDER — LORAZEPAM 0.5 MG PO TABS
0.5000 mg | ORAL_TABLET | Freq: Once | ORAL | Status: DC
  Filled 2023-07-24: qty 1

## 2023-07-24 NOTE — ED Provider Notes (Signed)
 [place] EMERGENCY DEPARTMENT AT [facility] Provider Note   CSN: [id number] Arrival date & time: 07/24/23  1142     History  Chief Complaint  Patient presents with   Panic Attack    Tracy [name] is a 28 y.o. male.  Patient is a 28 year old male who presents emergency department the chief complaint of chest pain and palpitations which began this morning.  Patient notes that he did feel anxious prior to onset of symptoms.  He notes that he has had no associated shortness of breath, dizziness, lightheadedness or syncope.  He did take his Klonopin prior to calling EMS.  He notes that this does feel similar to the previous onsets of an anxiety attack.  He denies any recent falls or blunt chest wall trauma.  He has had no lower extremity pain or edema or associated hemoptysis.  He has no known history of cardiac or pulmonary disease.        Home Medications Prior to Admission medications   Medication Sig Start Date End Date Taking? Authorizing Provider  budesonide-formoterol (SYMBICORT) 80-4.5 MCG/ACT inhaler Inhale 2 puffs into the lungs 2 (two) times daily as needed (Asthma).    [provider]  clonazePAM (KLONOPIN) 0.5 MG tablet Take 0.5 mg by mouth daily. 06/05/23   [provider]  famotidine (PEPCID) 20 MG tablet Take 20 mg by mouth 2 (two) times daily. 05/19/23   [provider]  hydrOXYzine (ATARAX) 25 MG tablet Take 1 tablet (25 mg total) by mouth every 6 (six) hours. Patient taking differently: Take 25 mg by mouth 3 (three) times daily. 05/07/23   Geoffery Lyons, MD  LORazepam (ATIVAN) 0.5 MG tablet Take 1 tablet (0.5 mg total) by mouth 3 (three) times daily as needed for anxiety. 05/17/23   Elpidio Anis, PA-C  QUEtiapine (SEROQUEL) 25 MG tablet Take 25 mg by mouth at bedtime. 05/29/23   [provider]      Allergies    Penicillins    Review of Systems   Review of Systems  Cardiovascular:  Positive for chest pain and  palpitations.  All other systems reviewed and are negative.   Physical Exam Updated Vital Signs BP 127/88   Pulse 74   Temp 98.1 F (36.7 C) (Oral)   Resp 11   SpO2 100%  Physical Exam Vitals reviewed.  Constitutional:      Appearance: Normal appearance.  HENT:     Head: Normocephalic and atraumatic.     Nose: Nose normal.     Mouth/Throat:     Mouth: Mucous membranes are moist.  Eyes:     Extraocular Movements: Extraocular movements intact.     Conjunctiva/sclera: Conjunctivae normal.     Pupils: Pupils are equal, round, and reactive to light.  Cardiovascular:     Rate and Rhythm: Normal rate and regular rhythm.     Pulses: Normal pulses.     Heart sounds: Normal heart sounds. No murmur heard.    No friction rub. No gallop.  Pulmonary:     Effort: Pulmonary effort is normal. No respiratory distress.     Breath sounds: Normal breath sounds. No stridor. No wheezing, rhonchi or rales.  Abdominal:     General: Abdomen is flat. Bowel sounds are normal. There is no distension.     Palpations: Abdomen is soft.     Tenderness: There is no abdominal tenderness.  Musculoskeletal:        General: Normal range of motion.  Cervical back: Normal range of motion and neck supple.  Skin:    General: Skin is warm and dry.  Neurological:     General: No focal deficit present.     Mental Status: He is alert and oriented to person, place, and time. Mental status is at baseline.  Psychiatric:        Mood and Affect: Mood normal.        Behavior: Behavior normal.        Thought Content: Thought content normal.        Judgment: Judgment normal.     ED Results / Procedures / Treatments   Labs (all labs ordered are listed, but only abnormal results are displayed) Labs Reviewed  COMPREHENSIVE METABOLIC PANEL - Abnormal; Notable for the following components:      Result Value   Potassium 3.3 (*)    All other components within normal limits  CBC WITH DIFFERENTIAL/PLATELET -  Abnormal; Notable for the following components:   Neutro Abs 1.4 (*)    All other components within normal limits  TROPONIN I (HIGH SENSITIVITY)  TROPONIN I (HIGH SENSITIVITY)    EKG None  Radiology DG Chest Port 1 View Result Date: 07/24/2023 CLINICAL DATA:  CP EXAM: PORTABLE CHEST 1 VIEW COMPARISON:  Chest x-ray 07/20/2023 FINDINGS: Wireless cardiac monitor overlie present. The heart and mediastinal contours are within normal limits. No focal consolidation. No pulmonary edema. No pleural effusion. No pneumothorax. No acute osseous abnormality. IMPRESSION: No active disease. Electronically Signed   By: Tish Frederickson M.D.   On: 07/24/2023 12:58    Procedures Procedures    Medications Ordered in ED Medications  LORazepam (ATIVAN) tablet 0.5 mg (0 mg Oral Hold 07/24/23 1224)  potassium chloride SA (KLOR-CON M) CR tablet 40 mEq (40 mEq Oral Given 07/24/23 1524)    ED Course/ Medical Decision Making/ A&P                                 Medical Decision Making Amount and/or Complexity of Data Reviewed Labs: ordered. Radiology: ordered.  Risk Prescription drug management.   This patient presents to the ED for concern of palpitations, chest pain, anxiety differential diagnosis includes ACS, PE, pericarditis, myocarditis, endocarditis, aortic aneurysm or dissection, anxiety    Additional history obtained:  Additional history obtained from medical records External records from outside source obtained and reviewed including none   Lab Tests:  I Ordered, and personally interpreted labs.  The pertinent results include: Normal serial troponins, no electrolyte changes   Imaging Studies ordered:  I ordered imaging studies including chest x-ray I independently visualized and interpreted imaging which showed no acute cardiopulmonary process I agree with the radiologist interpretation     Problem List / ED Course:  Patient does remain stable at this time and symptoms  have resolved.  Do suspect the patient symptoms are secondary to anxiety at this point.  He has no indication for ACS with an EKG with no acute changes and negative serial troponins.  Do not suspect pulmonary embolus in this patient as he is otherwise low risk via his PERC score.  He has no positional pain and do not suspect pericarditis or myocarditis.  He has no indication for endocarditis.  Do not suspect aortic aneurysm or dissection.  Chest x-ray demonstrated no indication for pneumonia, pneumothorax, hemothorax.  The need for continued close follow-up with his primary care doctor on outpatient basis was  discussed as well as strict turn precautions for any new or worsening symptoms.  Patient voiced understanding to the plan and had no additional questions.   Social Determinants of Health:  None           Final Clinical Impression(s) / ED Diagnoses Final diagnoses:  None    Rx / DC Orders ED Discharge Orders     None         Kathlen Mody 07/24/23 1607    Benjiman Core, MD 07/25/23 [id number]

## 2023-07-24 NOTE — ED Notes (Addendum)
 Pt is sitting in bed relaxed. No apparent distress seen, able to complete full sentences without being SOB. Pt denies being in any pain at this time. Pt stated he took his prescribed Klonopin before arriving in ED room 15.

## 2023-07-24 NOTE — ED Notes (Signed)
 This nurse went in to speak with pt. Pt is asleep and snoring at this time.

## 2023-07-24 NOTE — Discharge Instructions (Signed)
 These follow-up closely with your primary care doctor on an outpatient basis.  Return to emergency department immediately for any new or worsening symptoms.

## 2023-07-24 NOTE — ED Triage Notes (Signed)
 Pt arrives via [facility] for anxiety attack. Pt has hx of same. VS WNL with EMS.

## 2023-08-04 ENCOUNTER — Encounter (HOSPITAL_COMMUNITY): Payer: Self-pay

## 2023-08-04 ENCOUNTER — Other Ambulatory Visit: Payer: Self-pay

## 2023-08-04 ENCOUNTER — Emergency Department (HOSPITAL_COMMUNITY)
Admission: EM | Admit: 2023-08-04 | Discharge: 2023-08-05 | Discharge status: Against Medical Advice | Payer: MEDICAID | Attending: Emergency Medicine | Admitting: Emergency Medicine

## 2023-08-04 DIAGNOSIS — Z5321 Procedure and treatment not carried out due to patient leaving prior to being seen by health care provider: Secondary | ICD-10-CM | POA: Insufficient documentation

## 2023-08-04 DIAGNOSIS — R519 Headache, unspecified: Secondary | ICD-10-CM | POA: Insufficient documentation
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
## 2023-08-05 NOTE — ED Notes (Signed)
 Pt called for vitals x2 with no answer. Registration stated they saw him walk out

## 2023-08-06 ENCOUNTER — Emergency Department (HOSPITAL_COMMUNITY)
Admission: EM | Admit: 2023-08-06 | Discharge: 2023-08-06 | Disposition: A | Discharge status: Home / Self Care | Payer: MEDICAID | Attending: Student | Admitting: Student

## 2023-08-06 ENCOUNTER — Other Ambulatory Visit: Payer: Self-pay

## 2023-08-06 DIAGNOSIS — R0789 Other chest pain: Secondary | ICD-10-CM | POA: Diagnosis not present

## 2023-08-06 DIAGNOSIS — Z87891 Personal history of nicotine dependence: Secondary | ICD-10-CM | POA: Insufficient documentation

## 2023-08-06 DIAGNOSIS — R079 Chest pain, unspecified: Secondary | ICD-10-CM | POA: Diagnosis present

## 2023-08-06 DIAGNOSIS — R1013 Epigastric pain: Secondary | ICD-10-CM | POA: Insufficient documentation

## 2023-08-06 DIAGNOSIS — J45909 Unspecified asthma, uncomplicated: Secondary | ICD-10-CM | POA: Diagnosis not present

## 2023-08-06 MED ORDER — ESOMEPRAZOLE MAGNESIUM 40 MG PO CPDR
40.0000 mg | DELAYED_RELEASE_CAPSULE | Freq: Two times a day (BID) | ORAL | 0 refills | Status: DC
Start: 2023-08-06 — End: 2023-10-21

## 2023-08-06 MED ORDER — CLONAZEPAM 0.5 MG PO TABS: 0.5000 mg | ORAL_TABLET | Freq: Every day | ORAL | 0 refills | Status: DC

## 2023-08-06 MED ORDER — LIDOCAINE VISCOUS HCL 2 % MT SOLN
15.0000 mL | Freq: Once | OROMUCOSAL | Status: AC
  Administered 2023-08-06: 15 mL via ORAL
  Filled 2023-08-06: qty 15

## 2023-08-06 MED ORDER — MAALOX MAX 400-400-40 MG/5ML PO SUSP: 15.0000 mL | ORAL | 0 refills | Status: DC | PRN

## 2023-08-06 MED ORDER — ALUM & MAG HYDROXIDE-SIMETH 200-200-20 MG/5ML PO SUSP
30.0000 mL | Freq: Once | ORAL | Status: AC
  Administered 2023-08-06: 30 mL via ORAL
  Filled 2023-08-06: qty 30
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
## 2023-08-06 NOTE — ED Triage Notes (Signed)
 Pt for home complains of CP, states that it feels like is acid reflux. Feeling burning in stomach and having chest pressure. Pt AAOx4 and ambulatory. States that he hasn't eaten since earlier yesterday. Hx of anxiety. Complains of mild SOB.

## 2023-08-06 NOTE — ED Notes (Signed)
 Pt given GI cocktail, states burning sensation has decreased but still having some chest pressure.

## 2023-08-09 ENCOUNTER — Encounter: Payer: Self-pay | Admitting: *Deleted

## 2023-08-17 ENCOUNTER — Encounter (HOSPITAL_COMMUNITY): Payer: Self-pay | Admitting: Emergency Medicine

## 2023-08-17 ENCOUNTER — Emergency Department (HOSPITAL_COMMUNITY): Payer: MEDICAID

## 2023-08-17 ENCOUNTER — Emergency Department (HOSPITAL_COMMUNITY)
Admission: EM | Admit: 2023-08-17 | Discharge: 2023-08-18 | Discharge status: Against Medical Advice | Payer: MEDICAID | Attending: Emergency Medicine | Admitting: Emergency Medicine

## 2023-08-17 ENCOUNTER — Other Ambulatory Visit: Payer: Self-pay

## 2023-08-17 DIAGNOSIS — Z5321 Procedure and treatment not carried out due to patient leaving prior to being seen by health care provider: Secondary | ICD-10-CM | POA: Insufficient documentation

## 2023-08-17 DIAGNOSIS — R0789 Other chest pain: Secondary | ICD-10-CM | POA: Diagnosis present

## 2023-08-17 LAB — CBC
HCT: 44.8 % (ref 39.0–52.0)
Hemoglobin: 15.5 g/dL (ref 13.0–17.0)
MCH: 29.5 pg (ref 26.0–34.0)
MCHC: 34.6 g/dL (ref 30.0–36.0)
MCV: 85.3 fL (ref 80.0–100.0)
Platelets: 286 10*3/uL (ref 150–400)
RBC: 5.25 MIL/uL (ref 4.22–5.81)
RDW: 12.7 % (ref 11.5–15.5)
WBC: 5.3 10*3/uL (ref 4.0–10.5)
nRBC: 0 % (ref 0.0–0.2)

## 2023-08-17 LAB — BASIC METABOLIC PANEL
Anion gap: 6 (ref 5–15)
BUN: 15 mg/dL (ref 6–20)
CO2: 29 mmol/L (ref 22–32)
Calcium: 9.6 mg/dL (ref 8.9–10.3)
Chloride: 103 mmol/L (ref 98–111)
Creatinine, Ser: 1.05 mg/dL (ref 0.61–1.24)
GFR, Estimated: >60 mL/min (ref >60)
Glucose, Bld: 91 mg/dL (ref 70–99)
Potassium: 3.6 mmol/L (ref 3.5–5.1)
Sodium: 138 mmol/L (ref 135–145)

## 2023-08-17 LAB — TROPONIN I (HIGH SENSITIVITY): Troponin I (High Sensitivity): 9 ng/L (ref <18)

## 2023-08-17 NOTE — ED Triage Notes (Signed)
 Pt c/o chest pain and tightness that typically starts after eating, states that he cannot tell if the pain is related to GERD or anxiety.

## 2023-08-18 NOTE — ED Notes (Signed)
 Pt stated he was leaving because he can not wait all night

## 2023-08-19 ENCOUNTER — Encounter (HOSPITAL_COMMUNITY): Payer: Self-pay | Admitting: Emergency Medicine

## 2023-08-19 ENCOUNTER — Emergency Department (HOSPITAL_COMMUNITY)
Admission: EM | Admit: 2023-08-19 | Discharge: 2023-08-19 | Disposition: A | Discharge status: Home / Self Care | Payer: MEDICAID | Attending: Emergency Medicine | Admitting: Emergency Medicine

## 2023-08-19 ENCOUNTER — Other Ambulatory Visit: Payer: Self-pay

## 2023-08-19 ENCOUNTER — Emergency Department (HOSPITAL_COMMUNITY): Payer: MEDICAID

## 2023-08-19 DIAGNOSIS — R519 Headache, unspecified: Secondary | ICD-10-CM | POA: Diagnosis present

## 2023-08-19 DIAGNOSIS — F419 Anxiety disorder, unspecified: Secondary | ICD-10-CM | POA: Diagnosis not present

## 2023-08-19 MED ORDER — NAPROXEN 250 MG PO TABS
500.0000 mg | ORAL_TABLET | Freq: Once | ORAL | Status: AC
  Administered 2023-08-19: 500 mg via ORAL
  Filled 2023-08-19: qty 2

## 2023-08-19 NOTE — ED Provider Notes (Signed)
 [place] EMERGENCY DEPARTMENT AT [facility] Provider Note   CSN: [id number] Arrival date & time: 08/19/23  1601     History  Chief Complaint  Patient presents with   Headache   Anxiety    Tracy [name] is a 28 y.o. male.  Patient is a 28 year old male who presents emergency department the chief complaint of left-sided headache which began approximate 2 hours prior to arrival.  Patient notes that headache does feel similar to previous.  He does note that the headache was gradual in onset.  He denies any associated syncope, dizziness, lightheadedness.  He denies any recent falls or blunt head trauma.  He denies any numbness, paresthesias or unilateral weakness throughout.  He denies any associated chest pain, shortness of breath, abdominal pain, nausea, vomiting, diarrhea.  He has had no associated vision or hearing changes.   Headache Anxiety Associated symptoms include headaches.       Home Medications Prior to Admission medications   Medication Sig Start Date End Date Taking? Authorizing Provider  alum & mag hydroxide-simeth (MAALOX MAX) 400-400-40 MG/5ML suspension Take 15 mLs by mouth as needed for indigestion. 08/06/23   Kommor, Madison, MD  budesonide-formoterol [facility]) 80-4.5 MCG/ACT inhaler Inhale 2 puffs into the lungs 2 (two) times daily as needed (Asthma).    [provider]  clonazePAM (KLONOPIN) 0.5 MG tablet Take 1 tablet (0.5 mg total) by mouth daily. 08/06/23   Kommor, Madison, MD  esomeprazole (NEXIUM) 40 MG capsule Take 1 capsule (40 mg total) by mouth 2 (two) times daily before a meal. 08/06/23   Kommor, Madison, MD  famotidine (PEPCID) 20 MG tablet Take 20 mg by mouth 2 (two) times daily. 05/19/23   [provider]  hydrOXYzine (ATARAX) 25 MG tablet Take 1 tablet (25 mg total) by mouth every 6 (six) hours. Patient taking differently: Take 25 mg by mouth 3 (three) times daily. 05/07/23   Geoffery Lyons, MD  LORazepam (ATIVAN) 0.5 MG  tablet Take 1 tablet (0.5 mg total) by mouth 3 (three) times daily as needed for anxiety. 05/17/23   Elpidio Anis, PA-C  QUEtiapine (SEROQUEL) 25 MG tablet Take 25 mg by mouth at bedtime. 05/29/23   [provider]      Allergies    Penicillins    Review of Systems   Review of Systems  Neurological:  Positive for headaches.  All other systems reviewed and are negative.   Physical Exam Updated Vital Signs BP 123/77 (BP Location: Right Arm)   Pulse 66   Temp 99 F (37.2 C) (Oral)   Resp 16   Ht 6\' 1"  (1.854 m)   Wt 88.7 kg   SpO2 98%   BMI 25.81 kg/m  Physical Exam Vitals and nursing note reviewed.  Constitutional:      Appearance: Normal appearance.  HENT:     Head: Normocephalic and atraumatic.     Nose: Nose normal.     Mouth/Throat:     Mouth: Mucous membranes are moist.  Eyes:     General: No visual field deficit.    Extraocular Movements: Extraocular movements intact.     Conjunctiva/sclera: Conjunctivae normal.     Pupils: Pupils are equal, round, and reactive to light.  Cardiovascular:     Rate and Rhythm: Normal rate and regular rhythm.     Pulses: Normal pulses.     Heart sounds: Normal heart sounds.  Pulmonary:     Effort: Pulmonary effort is normal.  Breath sounds: Normal breath sounds.  Abdominal:     General: Abdomen is flat. Bowel sounds are normal.     Palpations: Abdomen is soft.  Musculoskeletal:        General: Normal range of motion.     Cervical back: Normal range of motion and neck supple. No rigidity.  Lymphadenopathy:     Cervical: No cervical adenopathy.  Skin:    General: Skin is warm and dry.  Neurological:     General: No focal deficit present.     Mental Status: He is alert and oriented to person, place, and time. Mental status is at baseline.     Cranial Nerves: No cranial nerve deficit, dysarthria or facial asymmetry.     Sensory: No sensory deficit.     Motor: No weakness.     Coordination: Coordination normal.      Gait: Gait normal.  Psychiatric:        Mood and Affect: Mood normal.        Behavior: Behavior normal.        Thought Content: Thought content normal.        Judgment: Judgment normal.     ED Results / Procedures / Treatments   Labs (all labs ordered are listed, but only abnormal results are displayed) Labs Reviewed - No data to display  EKG None  Radiology CT Head Wo Contrast Result Date: 08/19/2023 CLINICAL DATA:  Sudden severe headache, sharp and shooting to the back of right side of head EXAM: CT HEAD WITHOUT CONTRAST TECHNIQUE: Contiguous axial images were obtained from the base of the skull through the vertex without intravenous contrast. RADIATION DOSE REDUCTION: This exam was performed according to the departmental dose-optimization program which includes automated exposure control, adjustment of the mA and/or kV according to patient size and/or use of iterative reconstruction technique. COMPARISON:  05/14/2019 FINDINGS: Brain: No intracranial hemorrhage, mass effect, or evidence of acute infarct. No hydrocephalus. No extra-axial fluid collection. Vascular: No hyperdense vessel or unexpected calcification. Skull: No fracture or focal lesion. Sinuses/Orbits: No acute finding. Other: None. IMPRESSION: No acute intracranial abnormality. Electronically Signed   By: Minerva Fester M.D.   On: 08/19/2023 19:10   DG Chest 2 View Result Date: 08/18/2023 CLINICAL DATA:  Chest pain EXAM: CHEST - 2 VIEW COMPARISON:  07/24/2023 FINDINGS: The heart size and mediastinal contours are within normal limits. Both lungs are clear. The visualized skeletal structures are unremarkable. IMPRESSION: Normal study. Electronically Signed   By: Charlett Nose M.D.   On: 08/18/2023 00:09    Procedures Procedures    Medications Ordered in ED Medications  naproxen (NAPROSYN) tablet 500 mg (500 mg Oral Given 08/19/23 1941)    ED Course/ Medical Decision Making/ A&P                                  Medical Decision Making Patient is doing well at this time and is stable for discharge home.  CT scan of the head was unremarkable any signs of acute intracranial hemorrhage or mass.  Do not suspect underlying etiology such as subarachnoid hemorrhage at this point.  Patient had no thunderclap nature of the headache, dizziness, lightheadedness or syncope.  He does note that the headache is consistent with previous though did seem slightly more severe.  Patient is headache free at this time.  He does note that he has had similar headaches with his anxiety in the  past and do feel that this may be the cause.  Patient has no clinical indication for etiology such as meningitis.  He has had no concerning neurological deficits or changes in vision or hearing.  Discussed with patient at the need for close follow-up with his primary care doctor on an outpatient basis.  Patient voiced understanding to the plan and had no additional questions.  CT scan was independently reviewed by myself which demonstrated no signs of acute intracranial hemorrhage or mass  Amount and/or Complexity of Data Reviewed Radiology: ordered.  Risk Prescription drug management.           Final Clinical Impression(s) / ED Diagnoses Final diagnoses:  None    Rx / DC Orders ED Discharge Orders     None         Kathlen Mody 08/19/23 2028    Bethann Berkshire, MD 08/20/23 1428

## 2023-08-19 NOTE — Discharge Instructions (Signed)
 Please follow-up closely with your primary care doctor on an outpatient basis.  Return to emergency department immediately for any new or worsening symptoms.

## 2023-08-19 NOTE — ED Triage Notes (Signed)
 Pt bib pov w/ c/o a sharp and shooting headache from the back of the right side of his head to the front starting about 1 hour ago. Pt reports he thought it might be anxiety so he took tylenol and klonopin. Headache has subsided at this time, but pt still has some anxiety.
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
## 2023-08-23 ENCOUNTER — Encounter: Payer: Self-pay | Admitting: Gastroenterology

## 2023-08-23 ENCOUNTER — Ambulatory Visit: Payer: MEDICAID | Admitting: Gastroenterology

## 2023-08-25 ENCOUNTER — Ambulatory Visit: Payer: MEDICAID | Admitting: Internal Medicine

## 2023-08-28 ENCOUNTER — Emergency Department (HOSPITAL_COMMUNITY)
Admission: EM | Admit: 2023-08-28 | Discharge: 2023-08-28 | Disposition: A | Discharge status: Home / Self Care | Payer: MEDICAID | Attending: Emergency Medicine | Admitting: Emergency Medicine

## 2023-08-28 ENCOUNTER — Other Ambulatory Visit: Payer: Self-pay

## 2023-08-28 ENCOUNTER — Encounter (HOSPITAL_COMMUNITY): Payer: Self-pay

## 2023-08-28 ENCOUNTER — Emergency Department (HOSPITAL_COMMUNITY): Payer: MEDICAID

## 2023-08-28 DIAGNOSIS — F419 Anxiety disorder, unspecified: Secondary | ICD-10-CM | POA: Insufficient documentation

## 2023-08-28 DIAGNOSIS — R519 Headache, unspecified: Secondary | ICD-10-CM | POA: Diagnosis not present

## 2023-08-28 DIAGNOSIS — R079 Chest pain, unspecified: Secondary | ICD-10-CM | POA: Diagnosis present

## 2023-08-28 LAB — BASIC METABOLIC PANEL WITH GFR
Anion gap: 7 (ref 5–15)
BUN: 14 mg/dL (ref 6–20)
CO2: 28 mmol/L (ref 22–32)
Calcium: 9.6 mg/dL (ref 8.9–10.3)
Chloride: 102 mmol/L (ref 98–111)
Creatinine, Ser: 1.05 mg/dL (ref 0.61–1.24)
GFR, Estimated: >60 mL/min (ref >60)
Glucose, Bld: 93 mg/dL (ref 70–99)
Potassium: 3.9 mmol/L (ref 3.5–5.1)
Sodium: 137 mmol/L (ref 135–145)

## 2023-08-28 LAB — CBC
HCT: 43.2 % (ref 39.0–52.0)
Hemoglobin: 15.3 g/dL (ref 13.0–17.0)
MCH: 30.1 pg (ref 26.0–34.0)
MCHC: 35.4 g/dL (ref 30.0–36.0)
MCV: 85 fL (ref 80.0–100.0)
Platelets: 272 10*3/uL (ref 150–400)
RBC: 5.08 MIL/uL (ref 4.22–5.81)
RDW: 12.7 % (ref 11.5–15.5)
WBC: 4.6 10*3/uL (ref 4.0–10.5)
nRBC: 0 % (ref 0.0–0.2)

## 2023-08-28 LAB — TROPONIN I (HIGH SENSITIVITY)
Troponin I (High Sensitivity): 11 ng/L (ref <18)
Troponin I (High Sensitivity): 11 ng/L (ref <18)

## 2023-08-28 MED ORDER — ALUM & MAG HYDROXIDE-SIMETH 200-200-20 MG/5ML PO SUSP
30.0000 mL | Freq: Once | ORAL | Status: AC
  Administered 2023-08-28: 30 mL via ORAL
  Filled 2023-08-28: qty 30

## 2023-08-28 MED ORDER — ACETAMINOPHEN 325 MG PO TABS
650.0000 mg | ORAL_TABLET | Freq: Once | ORAL | Status: AC
  Administered 2023-08-28: 650 mg via ORAL
  Filled 2023-08-28: qty 2

## 2023-08-28 NOTE — Discharge Instructions (Signed)
 Please read and follow all provided instructions.  Your diagnoses today include:  1. Nonspecific chest pain   2. Acute nonintractable headache, unspecified headache type   3. Anxiety     Tests performed today include: An EKG of your heart A chest x-ray Cardiac enzymes - a blood test for heart muscle damage Blood counts and electrolytes Vital signs. See below for your results today.   Medications prescribed:  None  Take any prescribed medications only as directed.  Follow-up instructions: Please follow-up with your primary care provider as soon as you can for further evaluation of your symptoms.   Return instructions:  SEEK IMMEDIATE MEDICAL ATTENTION IF: You have severe chest pain, especially if the pain is crushing or pressure-like and spreads to the arms, back, neck, or jaw, or if you have sweating, nausea or vomiting, or trouble with breathing. THIS IS AN EMERGENCY. Do not wait to see if the pain will go away. Get medical help at once. Call 911. DO NOT drive yourself to the hospital.  Your chest pain gets worse and does not go away after a few minutes of rest.  You have an attack of chest pain lasting longer than what you usually experience.  You have significant dizziness, if you pass out, or have trouble walking.  You have chest pain not typical of your usual pain for which you originally saw your caregiver.  You have any other emergent concerns regarding your health.  Additional Information: Chest pain comes from many different causes. Your caregiver has diagnosed you as having chest pain that is not specific for one problem, but does not require admission.  You are at low risk for an acute heart condition or other serious illness.   Your vital signs today were: BP 129/88   Pulse (!) 54   Temp 98.4 F (36.9 C)   Resp 15   SpO2 100%  If your blood pressure (BP) was elevated above 135/85 this visit, please have this repeated by your doctor within one  month. --------------

## 2023-08-28 NOTE — ED Provider Notes (Signed)
  EMERGENCY DEPARTMENT AT [facility] Provider Note   CSN: [id number] Arrival date & time: 08/28/23  4098     History  Chief Complaint  Patient presents with   Chest Pain   Headache    Tracy [name] is a 28 y.o. male.  Patient with history of anxiety, GERD, high cholesterol --presents to the emergency department today for evaluation of chest discomfort and headache.  Patient reports symptoms being recurrent.  These are interfering with his life to the point where he has not been able to work.  Patient reports awaking at around 5:30-5:45am today with chest discomfort, racing heart, and headache.  Symptoms are similar to previous.  Pain did not radiate.  No vomiting or diaphoresis.  Symptoms started during sleep.  Patient reports that typically his symptoms occur about an hour after eating and drinking, however not this morning.  Headache is mild, generalized. Not worst of life or thunderclap. Patient denies risk factors for pulmonary embolism including: unilateral leg swelling, history of DVT/PE/other blood clots, use of exogenous hormones, recent immobilizations, recent surgery, recent travel (>4hr segment), malignancy, hemoptysis. Reports PCP [facility] but does not feel like he has improved much with treatment prescribed thus far.  He has not taken Klonopin today.  No family history of heart disease, but family with risk factors.  Patient denies history of hypertension, diabetes.       Home Medications Prior to Admission medications   Medication Sig Start Date End Date Taking? Authorizing Provider  alum & mag hydroxide-simeth (MAALOX MAX) 400-400-40 MG/5ML suspension Take 15 mLs by mouth as needed for indigestion. 08/06/23   Kommor, Madison, MD  budesonide-formoterol [facility]) 80-4.5 MCG/ACT inhaler Inhale 2 puffs into the lungs 2 (two) times daily as needed (Asthma).    [provider]  clonazePAM (KLONOPIN) 0.5 MG tablet Take 1 tablet (0.5 mg total)  by mouth daily. 08/06/23   Kommor, Madison, MD  esomeprazole (NEXIUM) 40 MG capsule Take 1 capsule (40 mg total) by mouth 2 (two) times daily before a meal. 08/06/23   Kommor, Madison, MD  famotidine (PEPCID) 20 MG tablet Take 20 mg by mouth 2 (two) times daily. 05/19/23   [provider]  hydrOXYzine (ATARAX) 25 MG tablet Take 1 tablet (25 mg total) by mouth every 6 (six) hours. Patient taking differently: Take 25 mg by mouth 3 (three) times daily. 05/07/23   Geoffery Lyons, MD  LORazepam (ATIVAN) 0.5 MG tablet Take 1 tablet (0.5 mg total) by mouth 3 (three) times daily as needed for anxiety. 05/17/23   Elpidio Anis, PA-C  QUEtiapine (SEROQUEL) 25 MG tablet Take 25 mg by mouth at bedtime. 05/29/23   [provider]      Allergies    Penicillins    Review of Systems   Review of Systems  Physical Exam Updated Vital Signs BP 129/88   Pulse 74   Temp 98.4 F (36.9 C)   Resp 14   SpO2 98%   Physical Exam Vitals and nursing note reviewed.  Constitutional:      Appearance: He is well-developed. He is not diaphoretic.  HENT:     Head: Normocephalic and atraumatic.     Mouth/Throat:     Mouth: Mucous membranes are not dry.  Eyes:     Conjunctiva/sclera: Conjunctivae normal.  Neck:     Vascular: Normal carotid pulses. No carotid bruit or JVD.     Trachea: Trachea normal. No tracheal deviation.  Cardiovascular:  Rate and Rhythm: Normal rate and regular rhythm.     Pulses: No decreased pulses.          Radial pulses are 2+ on the right side and 2+ on the left side.     Heart sounds: Normal heart sounds, S1 normal and S2 normal. Heart sounds not distant. No murmur heard. Pulmonary:     Effort: Pulmonary effort is normal. No respiratory distress.     Breath sounds: Normal breath sounds. No wheezing.  Chest:     Chest wall: No tenderness.  Abdominal:     General: Bowel sounds are normal.     Palpations: Abdomen is soft.     Tenderness: There is no abdominal  tenderness. There is no guarding or rebound.  Musculoskeletal:     Cervical back: Normal range of motion and neck supple. No muscular tenderness.     Right lower leg: No edema.     Left lower leg: No edema.  Skin:    General: Skin is warm and dry.     Coloration: Skin is not pale.  Neurological:     Mental Status: He is alert. Mental status is at baseline.  Psychiatric:        Mood and Affect: Mood is anxious.     ED Results / Procedures / Treatments   Labs (all labs ordered are listed, but only abnormal results are displayed) Labs Reviewed  BASIC METABOLIC PANEL WITH GFR  CBC  TROPONIN I (HIGH SENSITIVITY)  TROPONIN I (HIGH SENSITIVITY)    ED ECG REPORT   Date: 08/28/2023  Rate: 59  Rhythm: sinus bradycardia  QRS Axis: normal  Intervals: normal  ST/T Wave abnormalities: nonspecific T wave changes  Conduction Disutrbances:none  Narrative Interpretation:   Old EKG Reviewed: unchanged  I have personally reviewed the EKG tracing and agree with the computerized printout as noted.   Radiology DG Chest 2 View Result Date: 08/28/2023 CLINICAL DATA:  Chest pain EXAM: CHEST - 2 VIEW COMPARISON:  Eleven days prior FINDINGS: Normal heart size and mediastinal contours. No acute infiltrate or edema. No effusion or pneumothorax. No acute osseous findings. IMPRESSION: Negative chest. Electronically Signed   By: Tiburcio Pea M.D.   On: 08/28/2023 08:06    Procedures Procedures    Medications Ordered in ED Medications  alum & mag hydroxide-simeth (MAALOX/MYLANTA) 200-200-20 MG/5ML suspension 30 mL (30 mLs Oral Given 08/28/23 0750)  acetaminophen (TYLENOL) tablet 650 mg (650 mg Oral Given 08/28/23 0750)    ED Course/ Medical Decision Making/ A&P    Patient seen and examined. History obtained directly from patient.   Labs/EKG: Ordered CBC, BMP, troponin.  EKG personally reviewed and interpreted as above, patient with T wave abnormality that is unchanged.  Imaging: Ordered  chest x-ray  Medications/Fluids: Ordered: Maalox, Tylenol  Most recent vital signs reviewed and are as follows: BP 129/88   Pulse 74   Temp 98.4 F (36.9 C)   Resp 14   SpO2 98%   Initial impression: Atypical chest pain and headache without red flags.  10:36 AM Reassessment performed. Patient appears stable, resting.  Labs personally reviewed and interpreted including: CBC unremarkable; BMP unremarkable; troponin 11> 11.  Imaging personally visualized and interpreted including: Chest agree negative  Reviewed pertinent lab work and imaging with patient at bedside. Questions answered.   Most current vital signs reviewed and are as follows: BP 123/73 (BP Location: Right Arm)   Pulse (!) 54   Temp 97.7 F (36.5 C) (Oral)  Resp 13   SpO2 100%   Plan: Discharge to home.   Prescriptions written for: None  Other home care instructions discussed: Continue home meds  ED return instructions discussed: Try to avoid triggers, monitor symptoms  Follow-up instructions discussed: Patient encouraged to follow-up with their PCP in 7 days.                                Medical Decision Making Amount and/or Complexity of Data Reviewed Labs: ordered. Radiology: ordered.  Risk OTC drugs.   For this patient's complaint of chest pain, the following emergent conditions were considered on the differential diagnosis: acute coronary syndrome, pulmonary embolism, pneumothorax, myocarditis, pericardial tamponade, aortic dissection, thoracic aortic aneurysm complication, esophageal perforation.   Other causes were also considered including: gastroesophageal reflux disease, musculoskeletal pain including costochondritis, pneumonia/pleurisy, herpes zoster, pericarditis.  In regards to possibility of ACS, patient has atypical features of pain, non-ischemic and unchanged EKG and negative troponin(s). Heart score was calculated to be 1.   In regards to possibility of PE, symptoms are atypical  for PE and risk profile is low, making PE low likelihood.  Vitals normal, no unexplained tachycardia or hypoxia.  In regards to the patient's headache, critical differentials were considered including subarachnoid hemorrhage, intracerebral hemorrhage, epidural/subdural hematoma, pituitary apoplexy, vertebral/carotid artery dissection, giant cell arteritis, central venous thrombosis, reversible cerebral vasoconstriction, acute angle closure glaucoma, idiopathic intracranial hypertension, bacterial meningitis, viral encephalitis, carbon monoxide poisoning, posterior reversible encephalopathy syndrome, pre-eclampsia.   Reg flag symptoms related to these causes were considered including systemic symptoms (fever, weight loss), neurologic symptoms (confusion, mental status change, vision change, associated seizure), acute or sudden "thunderclap" onset, patient age 22 or older with new or progressive headache, patient of any age with first headache or change in headache pattern, pregnant or postpartum status, history of HIV or other immunocompromise, history of cancer, headache occurring with exertion, associated neck or shoulder pain, associated traumatic injury, concurrent use of anticoagulation, family history of spontaneous SAH, and concurrent drug use.    Other benign, more common causes of headache were considered including migraine, tension-type headache, cluster headache, referred pain from other cause such as sinus infection, dental pain, trigeminal neuralgia.   On exam, patient has a reassuring neuro exam including baseline mental status, no significant neck pain or meningeal signs, no signs of severe infection or fever.   The patient's vital signs, pertinent lab work and imaging were reviewed and interpreted as discussed in the ED course. Hospitalization was considered for further testing, treatments, or serial exams/observation. However as patient is well-appearing, has a stable exam over the course  of their evaluation, and reassuring studies today, I do not feel that they warrant admission at this time. This plan was discussed with the patient who verbalizes agreement and comfort with this plan and seems reliable and able to return to the Emergency Department with worsening or changing symptoms.          Final Clinical Impression(s) / ED Diagnoses Final diagnoses:  Nonspecific chest pain  Acute nonintractable headache, unspecified headache type  Anxiety    Rx / DC Orders ED Discharge Orders     None         Renne Crigler, PA-C 08/28/23 1038    Estelle June A, DO 08/29/23 [phone]

## 2023-08-28 NOTE — ED Triage Notes (Signed)
 Pt c.o left sided headache/anxiety along with chest pain for the past 30 mins.

## 2023-08-29 ENCOUNTER — Other Ambulatory Visit: Payer: Self-pay

## 2023-08-29 ENCOUNTER — Emergency Department (HOSPITAL_COMMUNITY)
Admission: EM | Admit: 2023-08-29 | Discharge: 2023-08-30 | Disposition: A | Discharge status: Home / Self Care | Payer: MEDICAID | Attending: Emergency Medicine | Admitting: Emergency Medicine

## 2023-08-29 ENCOUNTER — Encounter (HOSPITAL_COMMUNITY): Payer: Self-pay

## 2023-08-29 DIAGNOSIS — R42 Dizziness and giddiness: Secondary | ICD-10-CM | POA: Insufficient documentation

## 2023-08-29 DIAGNOSIS — J45909 Unspecified asthma, uncomplicated: Secondary | ICD-10-CM | POA: Diagnosis not present

## 2023-08-29 DIAGNOSIS — B349 Viral infection, unspecified: Secondary | ICD-10-CM

## 2023-08-29 DIAGNOSIS — R509 Fever, unspecified: Secondary | ICD-10-CM

## 2023-08-29 DIAGNOSIS — R110 Nausea: Secondary | ICD-10-CM | POA: Insufficient documentation

## 2023-08-29 NOTE — ED Triage Notes (Addendum)
 Pt c/o "feeling hot", lightheaded, and nauseous that started tonight.   Seen here multiple times for anxiety.

## 2023-08-30 LAB — RESP PANEL BY RT-PCR (RSV, FLU A&B, COVID)  RVPGX2
Influenza A by PCR: NEGATIVE
Influenza B by PCR: NEGATIVE
Resp Syncytial Virus by PCR: NEGATIVE
SARS Coronavirus 2 by RT PCR: NEGATIVE

## 2023-08-30 MED ORDER — ONDANSETRON 8 MG PO TBDP: ORAL_TABLET | ORAL | 0 refills | Status: DC

## 2023-08-30 MED ORDER — ONDANSETRON 8 MG PO TBDP
8.0000 mg | ORAL_TABLET | Freq: Once | ORAL | Status: AC
  Administered 2023-08-30: 8 mg via ORAL
  Filled 2023-08-30: qty 1

## 2023-08-30 MED ORDER — IBUPROFEN 400 MG PO TABS
600.0000 mg | ORAL_TABLET | Freq: Once | ORAL | Status: AC
  Administered 2023-08-30: 600 mg via ORAL
  Filled 2023-08-30: qty 2

## 2023-08-30 NOTE — Discharge Instructions (Addendum)
 Begin taking Zofran as prescribed as needed for nausea.  Begin taking Tylenol 1000 mg rotated with ibuprofen 600 mg every 4 hours as needed for fever.  Drink plenty of fluids and get plenty of rest.

## 2023-08-30 NOTE — ED Provider Notes (Signed)
  EMERGENCY DEPARTMENT AT [facility] Provider Note   CSN: [id number] Arrival date & time: 08/29/23  2337     History  Chief Complaint  Patient presents with   Multiple complaints    Tracy [name] is a 28 y.o. male.  This patient is a 28 year old male with history of asthma and anxiety.  Patient presenting today with complaints of feeling hot, nausea, lightheadedness, and feeling generally unwell.  He denies any cough.  He denies fever at home, but arrives here with a temp of 101.2.  Patient with multiple visits over the past several months for anxiety.  He does report taking Tylenol at home several hours ago.       Home Medications Prior to Admission medications   Medication Sig Start Date End Date Taking? Authorizing Provider  alum & mag hydroxide-simeth (MAALOX MAX) 400-400-40 MG/5ML suspension Take 15 mLs by mouth as needed for indigestion. 08/06/23   Kommor, Madison, MD  budesonide-formoterol [facility]) 80-4.5 MCG/ACT inhaler Inhale 2 puffs into the lungs 2 (two) times daily as needed (Asthma).    [provider]  clonazePAM (KLONOPIN) 0.5 MG tablet Take 1 tablet (0.5 mg total) by mouth daily. 08/06/23   Kommor, Madison, MD  esomeprazole (NEXIUM) 40 MG capsule Take 1 capsule (40 mg total) by mouth 2 (two) times daily before a meal. 08/06/23   Kommor, Madison, MD  famotidine (PEPCID) 20 MG tablet Take 20 mg by mouth 2 (two) times daily. 05/19/23   [provider]  hydrOXYzine (ATARAX) 25 MG tablet Take 1 tablet (25 mg total) by mouth every 6 (six) hours. Patient taking differently: Take 25 mg by mouth 3 (three) times daily. 05/07/23   Geoffery Lyons, MD  LORazepam (ATIVAN) 0.5 MG tablet Take 1 tablet (0.5 mg total) by mouth 3 (three) times daily as needed for anxiety. 05/17/23   Elpidio Anis, PA-C  QUEtiapine (SEROQUEL) 25 MG tablet Take 25 mg by mouth at bedtime. 05/29/23   [provider]      Allergies    Penicillins    Review  of Systems   Review of Systems  All other systems reviewed and are negative.   Physical Exam Updated Vital Signs BP 135/78   Pulse (!) 102   Temp (!) 101.2 F (38.4 C) (Oral)   Resp 18   SpO2 95%  Physical Exam Vitals and nursing note reviewed.  Constitutional:      General: He is not in acute distress.    Appearance: He is well-developed. He is not diaphoretic.  HENT:     Head: Normocephalic and atraumatic.  Cardiovascular:     Rate and Rhythm: Normal rate and regular rhythm.     Heart sounds: No murmur heard.    No friction rub.  Pulmonary:     Effort: Pulmonary effort is normal. No respiratory distress.     Breath sounds: Normal breath sounds. No wheezing or rales.  Abdominal:     General: Bowel sounds are normal. There is no distension.     Palpations: Abdomen is soft.     Tenderness: There is no abdominal tenderness.  Musculoskeletal:        General: Normal range of motion.     Cervical back: Normal range of motion and neck supple.  Skin:    General: Skin is warm and dry.  Neurological:     Mental Status: He is alert and oriented to person, place, and time.     Coordination: Coordination normal.  ED Results / Procedures / Treatments   Labs (all labs ordered are listed, but only abnormal results are displayed) Labs Reviewed - No data to display  EKG None  Radiology DG Chest 2 View Result Date: 08/28/2023 CLINICAL DATA:  Chest pain EXAM: CHEST - 2 VIEW COMPARISON:  Eleven days prior FINDINGS: Normal heart size and mediastinal contours. No acute infiltrate or edema. No effusion or pneumothorax. No acute osseous findings. IMPRESSION: Negative chest. Electronically Signed   By: Tiburcio Pea M.D.   On: 08/28/2023 08:06    Procedures Procedures    Medications Ordered in ED Medications  ondansetron (ZOFRAN-ODT) disintegrating tablet 8 mg (has no administration in time range)  ibuprofen (ADVIL) tablet 600 mg (has no administration in time range)     ED Course/ Medical Decision Making/ A&P  Patient is a 28 year old male presenting with complaints of nausea and feeling unwell.  Patient arrives here with stable vital signs, but is febrile with a temp of 101.2.  COVID/flu/RSV are all negative.  I highly suspect a viral etiology and feel as though patient can safely be discharged.  He was given ibuprofen here in the ER and his temperature is now 98.6.  He was also given Zofran for his nausea.  Final Clinical Impression(s) / ED Diagnoses Final diagnoses:  None    Rx / DC Orders ED Discharge Orders     None         Geoffery Lyons, MD 08/30/23 [phone]

## 2023-09-17 ENCOUNTER — Other Ambulatory Visit: Payer: Self-pay

## 2023-09-17 ENCOUNTER — Encounter (HOSPITAL_COMMUNITY): Payer: Self-pay | Admitting: *Deleted

## 2023-09-17 ENCOUNTER — Emergency Department (HOSPITAL_COMMUNITY)
Admission: EM | Admit: 2023-09-17 | Discharge: 2023-09-18 | Disposition: A | Discharge status: Home / Self Care | Payer: MEDICAID | Attending: Emergency Medicine | Admitting: Emergency Medicine

## 2023-09-17 DIAGNOSIS — R079 Chest pain, unspecified: Secondary | ICD-10-CM | POA: Diagnosis not present

## 2023-09-17 DIAGNOSIS — R1013 Epigastric pain: Secondary | ICD-10-CM | POA: Diagnosis not present

## 2023-09-17 DIAGNOSIS — R002 Palpitations: Secondary | ICD-10-CM | POA: Insufficient documentation

## 2023-09-17 DIAGNOSIS — R000 Tachycardia, unspecified: Secondary | ICD-10-CM | POA: Diagnosis not present

## 2023-09-17 LAB — CBC
HCT: 47.2 % (ref 39.0–52.0)
Hemoglobin: 16.1 g/dL (ref 13.0–17.0)
MCH: 29.3 pg (ref 26.0–34.0)
MCHC: 34.1 g/dL (ref 30.0–36.0)
MCV: 86 fL (ref 80.0–100.0)
Platelets: 290 10*3/uL (ref 150–400)
RBC: 5.49 MIL/uL (ref 4.22–5.81)
RDW: 12.6 % (ref 11.5–15.5)
WBC: 4.1 10*3/uL (ref 4.0–10.5)
nRBC: 0 % (ref 0.0–0.2)

## 2023-09-17 LAB — BASIC METABOLIC PANEL WITH GFR
Anion gap: 10 (ref 5–15)
BUN: 16 mg/dL (ref 6–20)
CO2: 26 mmol/L (ref 22–32)
Calcium: 9.6 mg/dL (ref 8.9–10.3)
Chloride: 104 mmol/L (ref 98–111)
Creatinine, Ser: 1.05 mg/dL (ref 0.61–1.24)
GFR, Estimated: >60 mL/min (ref >60)
Glucose, Bld: 91 mg/dL (ref 70–99)
Potassium: 3.7 mmol/L (ref 3.5–5.1)
Sodium: 140 mmol/L (ref 135–145)

## 2023-09-17 LAB — TROPONIN I (HIGH SENSITIVITY): Troponin I (High Sensitivity): 8 ng/L (ref <18)

## 2023-09-17 NOTE — ED Notes (Signed)
 Patient ambulated to the stretcher with [employer] bag, finishing his beverage. Patient is in no acute distress, sitting on the bed talking on the phone.

## 2023-09-17 NOTE — ED Triage Notes (Signed)
 Pt having tachycardia after eating ongoing for months.Tonight heart rate shot up to 150 while resting. Missed his GI appt due to death in family. C/o nausea, difficulty swallowing. Feels like symptoms could be related to stress. Also reports intermittent headaches not associated to tachycardia. Has been started on clonazepam  and antiacid medications but not taking them since it didn't seem to be helping. Symptoms improve with belching or vomiting

## 2023-09-18 LAB — HEPATIC FUNCTION PANEL
ALT: 17 U/L (ref 0–44)
AST: 26 U/L (ref 15–41)
Albumin: 4.2 g/dL (ref 3.5–5.0)
Alkaline Phosphatase: 47 U/L (ref 38–126)
Bilirubin, Direct: <0.1 mg/dL (ref 0.0–0.2)
Total Bilirubin: 0.5 mg/dL (ref 0.0–1.2)
Total Protein: 6.6 g/dL (ref 6.5–8.1)

## 2023-09-18 LAB — MAGNESIUM: Magnesium: 2 mg/dL (ref 1.7–2.4)

## 2023-09-18 LAB — TSH: TSH: 0.748 u[IU]/mL (ref 0.350–4.500)

## 2023-09-18 LAB — T4, FREE: Free T4: 0.92 ng/dL (ref 0.61–1.12)

## 2023-09-18 LAB — TROPONIN I (HIGH SENSITIVITY): Troponin I (High Sensitivity): 9 ng/L (ref <18)

## 2023-09-18 NOTE — ED Notes (Signed)
 ED Provider at bedside.

## 2023-09-18 NOTE — ED Provider Notes (Signed)
 [place] EMERGENCY DEPARTMENT AT [facility] Provider Note   CSN: [id number] Arrival date & time: 09/17/23  2039     History  Chief Complaint  Patient presents with   Abdominal Pain    Tracy [name] is a 28 y.o. male.  28 yo M here with multiple symptoms. Has had episodes of abdominal pain, palpitations, chest pain, anxiety over last few months. Thinks it is possibly related to stress/anxiety. Had been on some medications in the past which seemed to help some but still going on. Decreased intake, weight loss related to symptoms. Sometimes he has palpitations worse after eating. No associated dyspnea, nausea. Has been seen by pcp and cardiology. I reviewed his ziopatch results on his phone and it looks like he has episodes of sinus tachycardia with some infrequent PVC's. No edema.    Abdominal Pain      Home Medications Prior to Admission medications   Medication Sig Start Date End Date Taking? Authorizing Provider  alum & mag hydroxide-simeth (MAALOX MAX) 400-400-40 MG/5ML suspension Take 15 mLs by mouth as needed for indigestion. 08/06/23   Kommor, Madison, MD  budesonide-formoterol (SYMBICORT) 80-4.5 MCG/ACT inhaler Inhale 2 puffs into the lungs 2 (two) times daily as needed (Asthma).    [provider]  clonazePAM  (KLONOPIN ) 0.5 MG tablet Take 1 tablet (0.5 mg total) by mouth daily. 08/06/23   Kommor, Alyse July, MD  esomeprazole  (NEXIUM ) 40 MG capsule Take 1 capsule (40 mg total) by mouth 2 (two) times daily before a meal. 08/06/23   Kommor, Madison, MD  famotidine  (PEPCID ) 20 MG tablet Take 20 mg by mouth 2 (two) times daily. 05/19/23   [provider]  hydrOXYzine  (ATARAX ) 25 MG tablet Take 1 tablet (25 mg total) by mouth every 6 (six) hours. Patient taking differently: Take 25 mg by mouth 3 (three) times daily. 05/07/23   Orvilla Blander, MD  LORazepam  (ATIVAN ) 0.5 MG tablet Take 1 tablet (0.5 mg total) by mouth 3 (three) times daily as needed for anxiety.  05/17/23   Mandy Second, PA-C  ondansetron  (ZOFRAN -ODT) 8 MG disintegrating tablet 8mg  ODT q4 hours prn nausea 08/30/23   Orvilla Blander, MD  QUEtiapine (SEROQUEL) 25 MG tablet Take 25 mg by mouth at bedtime. 05/29/23   [provider]      Allergies    Penicillins    Review of Systems   Review of Systems  Gastrointestinal:  Positive for abdominal pain.    Physical Exam Updated Vital Signs BP (!) 136/91 (BP Location: Right Arm)   Pulse 96   Temp 98.6 F (37 C) (Oral)   Resp 18   Ht 6\' 1"  (1.854 m)   Wt 88 kg   SpO2 100%   BMI 25.60 kg/m  Physical Exam Vitals and nursing note reviewed.  Constitutional:      Appearance: He is well-developed.  HENT:     Head: Normocephalic and atraumatic.  Cardiovascular:     Rate and Rhythm: Normal rate.  Pulmonary:     Effort: Pulmonary effort is normal. No respiratory distress.  Abdominal:     General: There is no distension.  Musculoskeletal:        General: Normal range of motion.     Cervical back: Normal range of motion.  Neurological:     Mental Status: He is alert.     ED Results / Procedures / Treatments   Labs (all labs ordered are listed, but only abnormal results are displayed) Labs Reviewed  BASIC  METABOLIC PANEL WITH GFR  CBC  HEPATIC FUNCTION PANEL  MAGNESIUM   T4, FREE  TSH  TROPONIN I (HIGH SENSITIVITY)  TROPONIN I (HIGH SENSITIVITY)    EKG EKG Interpretation Date/Time:  Friday September 17 2023 20:47:00 EDT Ventricular Rate:  81 PR Interval:  138 QRS Duration:  70 QT Interval:  344 QTC Calculation: 399 R Axis:   65  Text Interpretation: Normal sinus rhythm T wave abnormality, consider inferior ischemia Abnormal ECG When compared with ECG of 28-Aug-2023 07:14, PREVIOUS ECG IS PRESENT Confirmed by Florette Hurry [phone]) on 09/18/2023 3:42:27 PM  Radiology No results found.  Procedures Procedures    Medications Ordered in ED Medications - No data to display  ED Course/ Medical Decision  Making/ A&P                                 Medical Decision Making Amount and/or Complexity of Data Reviewed Labs: ordered.   Will assess common causes for sinus tachycardia and if negative, refer back to PCP for continued workup/management.   Workup reassuring. Patient requested gi consult.    Final Clinical Impression(s) / ED Diagnoses Final diagnoses:  Epigastric pain  Palpitations    Rx / DC Orders ED Discharge Orders          Ordered    Ambulatory referral to Gastroenterology        09/18/23 0251              Eve Hinders, MD 09/18/23 2307

## 2023-09-27 ENCOUNTER — Encounter: Payer: Self-pay | Admitting: Gastroenterology

## 2023-10-04 ENCOUNTER — Emergency Department (HOSPITAL_COMMUNITY): Payer: MEDICAID

## 2023-10-04 ENCOUNTER — Emergency Department (HOSPITAL_COMMUNITY)
Admission: EM | Admit: 2023-10-04 | Discharge: 2023-10-04 | Disposition: A | Discharge status: Home / Self Care | Payer: MEDICAID | Attending: Emergency Medicine | Admitting: Emergency Medicine

## 2023-10-04 ENCOUNTER — Other Ambulatory Visit: Payer: Self-pay

## 2023-10-04 ENCOUNTER — Encounter (HOSPITAL_COMMUNITY): Payer: Self-pay | Admitting: Emergency Medicine

## 2023-10-04 DIAGNOSIS — Z8616 Personal history of COVID-19: Secondary | ICD-10-CM | POA: Insufficient documentation

## 2023-10-04 DIAGNOSIS — J45909 Unspecified asthma, uncomplicated: Secondary | ICD-10-CM | POA: Insufficient documentation

## 2023-10-04 DIAGNOSIS — R002 Palpitations: Secondary | ICD-10-CM | POA: Diagnosis not present

## 2023-10-04 DIAGNOSIS — R079 Chest pain, unspecified: Secondary | ICD-10-CM | POA: Insufficient documentation

## 2023-10-04 LAB — BASIC METABOLIC PANEL WITH GFR
Anion gap: 9 (ref 5–15)
BUN: 18 mg/dL (ref 6–20)
CO2: 24 mmol/L (ref 22–32)
Calcium: 9.6 mg/dL (ref 8.9–10.3)
Chloride: 104 mmol/L (ref 98–111)
Creatinine, Ser: 1.02 mg/dL (ref 0.61–1.24)
GFR, Estimated: >60 mL/min (ref >60)
Glucose, Bld: 92 mg/dL (ref 70–99)
Potassium: 3.8 mmol/L (ref 3.5–5.1)
Sodium: 137 mmol/L (ref 135–145)

## 2023-10-04 LAB — CBC
HCT: 47.1 % (ref 39.0–52.0)
Hemoglobin: 16.7 g/dL (ref 13.0–17.0)
MCH: 29.8 pg (ref 26.0–34.0)
MCHC: 35.5 g/dL (ref 30.0–36.0)
MCV: 84 fL (ref 80.0–100.0)
Platelets: 299 10*3/uL (ref 150–400)
RBC: 5.61 MIL/uL (ref 4.22–5.81)
RDW: 12.3 % (ref 11.5–15.5)
WBC: 4.4 10*3/uL (ref 4.0–10.5)
nRBC: 0 % (ref 0.0–0.2)

## 2023-10-04 LAB — TROPONIN I (HIGH SENSITIVITY): Troponin I (High Sensitivity): 10 ng/L (ref <18)
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
## 2023-10-04 NOTE — ED Notes (Signed)
Patient brought to room 

## 2023-10-04 NOTE — ED Notes (Signed)
 Patient discharged in stable condition, education materials explained including, follow up, any prescriptions and reasons to return. Patient voiced agreement to education and discharge material.

## 2023-10-04 NOTE — ED Triage Notes (Signed)
 Pt c/o left sided chest pain that started last night. States that he has acid reflux and anxiety, took pepto bismol without relief. Has been seen multiple times for the same complaint.

## 2023-10-12 ENCOUNTER — Other Ambulatory Visit: Payer: Self-pay

## 2023-10-12 ENCOUNTER — Encounter (HOSPITAL_COMMUNITY): Payer: Self-pay | Admitting: Emergency Medicine

## 2023-10-12 ENCOUNTER — Emergency Department (HOSPITAL_COMMUNITY)
Admission: EM | Admit: 2023-10-12 | Discharge: 2023-10-13 | Disposition: A | Discharge status: Home / Self Care | Payer: MEDICAID | Attending: Emergency Medicine | Admitting: Emergency Medicine

## 2023-10-12 DIAGNOSIS — H9312 Tinnitus, left ear: Secondary | ICD-10-CM | POA: Insufficient documentation

## 2023-10-12 DIAGNOSIS — R6884 Jaw pain: Secondary | ICD-10-CM | POA: Insufficient documentation

## 2023-10-12 DIAGNOSIS — J45909 Unspecified asthma, uncomplicated: Secondary | ICD-10-CM | POA: Diagnosis not present

## 2023-10-12 DIAGNOSIS — Z7951 Long term (current) use of inhaled steroids: Secondary | ICD-10-CM | POA: Insufficient documentation

## 2023-10-12 NOTE — ED Triage Notes (Signed)
 Pt states he was driving to [place] and he began to just feel weird. Pt states that his face felt funny, both legs felt weak, and short winded. Pt states he has a hx of anxiety but doesn't think that is what is wrong this time.

## 2023-10-13 LAB — I-STAT CHEM 8, ED
BUN: 19 mg/dL (ref 6–20)
Calcium, Ion: 1.23 mmol/L (ref 1.15–1.40)
Chloride: 104 mmol/L (ref 98–111)
Creatinine, Ser: 1.1 mg/dL (ref 0.61–1.24)
Glucose, Bld: 62 mg/dL — ABNORMAL LOW (ref 70–99)
HCT: 40 % (ref 39.0–52.0)
Hemoglobin: 13.6 g/dL (ref 13.0–17.0)
Potassium: 3.5 mmol/L (ref 3.5–5.1)
Sodium: 143 mmol/L (ref 135–145)
TCO2: 27 mmol/L (ref 22–32)

## 2023-10-13 LAB — CBG MONITORING, ED: Glucose-Capillary: 95 mg/dL (ref 70–99)

## 2023-10-13 NOTE — ED Notes (Signed)
 Pt given sandwich tray and 2 grape juice cups.

## 2023-10-13 NOTE — ED Provider Notes (Signed)
 [place] EMERGENCY DEPARTMENT AT [facility] Provider Note   CSN: [id number] Arrival date & time: 10/12/23  2331     History  Chief Complaint  Patient presents with   Multiple Complaints    Tracy [name] is a 28 y.o. male.  28 yo M w/ h/o anxiety, indigestion presents to the ED with multiple symptoms for the last hour or so. Was driving home and had a feeling of fullness in his face. Then noticed a clicking in his left TMJ and subsequent intermittent tinnitus, then felt like he couldn't breathe. Mild cough. Has had these symptoms previously but not together and just wants to get checked out as he is not sure if it is his anxiety but doesn't think so. No fevers, nausea, vomiting. Does have some light headedness sometimes with breathing fast. No paresthesias or palpitations. No recent illnesses or trauma.         Home Medications Prior to Admission medications   Medication Sig Start Date End Date Taking? Authorizing Provider  alum & mag hydroxide-simeth (MAALOX MAX) 400-400-40 MG/5ML suspension Take 15 mLs by mouth as needed for indigestion. 08/06/23   Kommor, Madison, MD  budesonide-formoterol (SYMBICORT) 80-4.5 MCG/ACT inhaler Inhale 2 puffs into the lungs 2 (two) times daily as needed (Asthma).    [provider]  clonazePAM  (KLONOPIN ) 0.5 MG tablet Take 1 tablet (0.5 mg total) by mouth daily. 08/06/23   Kommor, Alyse July, MD  esomeprazole  (NEXIUM ) 40 MG capsule Take 1 capsule (40 mg total) by mouth 2 (two) times daily before a meal. 08/06/23   Kommor, Madison, MD  famotidine  (PEPCID ) 20 MG tablet Take 20 mg by mouth 2 (two) times daily. 05/19/23   [provider]  hydrOXYzine  (ATARAX ) 25 MG tablet Take 1 tablet (25 mg total) by mouth every 6 (six) hours. Patient taking differently: Take 25 mg by mouth 3 (three) times daily. 05/07/23   Orvilla Blander, MD  LORazepam  (ATIVAN ) 0.5 MG tablet Take 1 tablet (0.5 mg total) by mouth 3 (three) times daily as needed for  anxiety. 05/17/23   Mandy Second, PA-C  ondansetron  (ZOFRAN -ODT) 8 MG disintegrating tablet 8mg  ODT q4 hours prn nausea 08/30/23   Orvilla Blander, MD  QUEtiapine (SEROQUEL) 25 MG tablet Take 25 mg by mouth at bedtime. 05/29/23   [provider]      Allergies    Penicillins    Review of Systems   Review of Systems  Physical Exam Updated Vital Signs BP (!) 148/94   Pulse (!) 57   Temp 97.9 F (36.6 C) (Oral)   Resp 17   Ht 6\' 1"  (1.854 m)   Wt 88 kg   SpO2 97%   BMI 25.60 kg/m  Physical Exam Vitals and nursing note reviewed.  Constitutional:      Appearance: He is well-developed.  HENT:     Head: Normocephalic and atraumatic.     Left Ear: Tympanic membrane normal.     Nose:     Right Sinus: No maxillary sinus tenderness or frontal sinus tenderness.     Left Sinus: No maxillary sinus tenderness or frontal sinus tenderness.  Cardiovascular:     Rate and Rhythm: Normal rate.  Pulmonary:     Effort: Pulmonary effort is normal. No respiratory distress.  Abdominal:     General: There is no distension.  Musculoskeletal:        General: Normal range of motion.     Cervical back: Normal range of motion.  Neurological:  Mental Status: He is alert.     ED Results / Procedures / Treatments   Labs (all labs ordered are listed, but only abnormal results are displayed) Labs Reviewed  I-STAT CHEM 8, ED - Abnormal; Notable for the following components:      Result Value   Glucose, Bld 62 (*)    All other components within normal limits  CBG MONITORING, ED    EKG None  Radiology No results found.  Procedures Procedures    Medications Ordered in ED Medications - No data to display  ED Course/ Medical Decision Making/ A&P                                 Medical Decision Making Amount and/or Complexity of Data Reviewed ECG/medicine tests: ordered.   Gliucose slightly low on istat, ecg ok. Exam ok. Felt better after food. Maybe hypoglycemia? No  other obvious causes. Patient wonders if it could be from pepto that he took for indigestion however he only took one dose, kidney function normal, doubt salicylate o/d. Symptom free at time of discharge.    Final Clinical Impression(s) / ED Diagnoses Final diagnoses:  Tinnitus of left ear  Jaw pain    Rx / DC Orders ED Discharge Orders     None         Mikeya Tomasetti, Reymundo Caulk, MD 10/13/23 [phone]

## 2023-10-19 ENCOUNTER — Emergency Department (HOSPITAL_COMMUNITY): Payer: MEDICAID

## 2023-10-19 ENCOUNTER — Other Ambulatory Visit: Payer: Self-pay

## 2023-10-19 ENCOUNTER — Encounter (HOSPITAL_COMMUNITY): Payer: Self-pay

## 2023-10-19 ENCOUNTER — Emergency Department (HOSPITAL_COMMUNITY)
Admission: EM | Admit: 2023-10-19 | Discharge: 2023-10-19 | Disposition: A | Discharge status: Home / Self Care | Payer: MEDICAID | Attending: Emergency Medicine | Admitting: Emergency Medicine

## 2023-10-19 DIAGNOSIS — R42 Dizziness and giddiness: Secondary | ICD-10-CM | POA: Insufficient documentation

## 2023-10-19 LAB — CBG MONITORING, ED: Glucose-Capillary: 109 mg/dL — ABNORMAL HIGH (ref 70–99)

## 2023-10-19 NOTE — ED Notes (Signed)
 ED Provider at bedside.

## 2023-10-19 NOTE — ED Notes (Signed)
 Pt used 1 puff of home inhaler upon being brought back to room.

## 2023-10-19 NOTE — ED Provider Notes (Signed)
 [place] EMERGENCY DEPARTMENT AT [facility] Provider Note   CSN: [id number] Arrival date & time: 10/19/23  0201     History Chief Complaint  Patient presents with   Dizziness    Tracy [name] is a 28 y.o. male.  28 yo M here with dizziness. Patient overall with similar complaints tonight as he had last week when I evaluated him. He has some sinus pressure, sob, dizziness all of which are intermittent in nature. Often at night. Has been worked up multiple times in the past for the same. No new symptoms. No illnesses. No fevers. No other assocaited symptoms.    Dizziness      Home Medications Prior to Admission medications   Medication Sig Start Date End Date Taking? Authorizing Provider  alum & mag hydroxide-simeth (MAALOX MAX) 400-400-40 MG/5ML suspension Take 15 mLs by mouth as needed for indigestion. 08/06/23   Kommor, Madison, MD  budesonide-formoterol (SYMBICORT) 80-4.5 MCG/ACT inhaler Inhale 2 puffs into the lungs 2 (two) times daily as needed (Asthma).    [provider]  clonazePAM  (KLONOPIN ) 0.5 MG tablet Take 1 tablet (0.5 mg total) by mouth daily. 08/06/23   Kommor, Alyse July, MD  esomeprazole  (NEXIUM ) 40 MG capsule Take 1 capsule (40 mg total) by mouth 2 (two) times daily before a meal. 08/06/23   Kommor, Madison, MD  famotidine  (PEPCID ) 20 MG tablet Take 20 mg by mouth 2 (two) times daily. 05/19/23   [provider]  hydrOXYzine  (ATARAX ) 25 MG tablet Take 1 tablet (25 mg total) by mouth every 6 (six) hours. Patient taking differently: Take 25 mg by mouth 3 (three) times daily. 05/07/23   Orvilla Blander, MD  LORazepam  (ATIVAN ) 0.5 MG tablet Take 1 tablet (0.5 mg total) by mouth 3 (three) times daily as needed for anxiety. 05/17/23   Mandy Second, PA-C  ondansetron  (ZOFRAN -ODT) 8 MG disintegrating tablet 8mg  ODT q4 hours prn nausea 08/30/23   Orvilla Blander, MD  QUEtiapine (SEROQUEL) 25 MG tablet Take 25 mg by mouth at bedtime. 05/29/23   [provider]      Allergies    Penicillins    Review of Systems   Review of Systems  Neurological:  Positive for dizziness.    Physical Exam Updated Vital Signs BP 107/88 (BP Location: Right Arm)   Pulse 68   Temp 98.1 F (36.7 C) (Oral)   Resp 18   Ht 6\' 1"  (1.854 m)   Wt 88.5 kg   SpO2 97%   BMI 25.73 kg/m  Physical Exam Vitals and nursing note reviewed.  Constitutional:      Appearance: He is well-developed.  HENT:     Head: Normocephalic and atraumatic.  Cardiovascular:     Rate and Rhythm: Normal rate.  Pulmonary:     Effort: Pulmonary effort is normal. No respiratory distress.  Abdominal:     General: There is no distension.  Musculoskeletal:        General: Normal range of motion.     Cervical back: Normal range of motion.  Skin:    General: Skin is warm and dry.  Neurological:     General: No focal deficit present.     Mental Status: He is alert.     ED Results / Procedures / Treatments   Labs (all labs ordered are listed, but only abnormal results are displayed) Labs Reviewed  CBG MONITORING, ED - Abnormal; Notable for the following components:      Result Value  Glucose-Capillary 109 (*)    All other components within normal limits    EKG EKG Interpretation Date/Time:  Tuesday Oct 19 2023 02:22:45 EDT Ventricular Rate:  70 PR Interval:  142 QRS Duration:  79 QT Interval:  355 QTC Calculation: 383 R Axis:   60  Text Interpretation: Sinus rhythm Nonspecific T abnormalities, inferior leads Confirmed by Eve Hinders [phone]) on 10/19/2023 3:54:05 AM  Radiology DG Chest Portable 1 View Result Date: 10/19/2023 CLINICAL DATA:  dizziness, dyspnea EXAM: PORTABLE CHEST 1 VIEW COMPARISON:  Chest x-ray 10/04/2023 FINDINGS: The heart and mediastinal contours are within normal limits. No focal consolidation. No pulmonary edema. No pleural effusion. No pneumothorax. No acute osseous abnormality. IMPRESSION: No active disease. Electronically Signed    By: Morgane  Naveau M.D.   On: 10/19/2023 03:43    Procedures Procedures    Medications Ordered in ED Medications - No data to display  ED Course/ Medical Decision Making/ A&P                                 Medical Decision Making Amount and/or Complexity of Data Reviewed Radiology: ordered. ECG/medicine tests: ordered.   Ecg/cxr ok. Cbg ok. Vitals unremarkable. PE unremarkable. No indication for more extensive workup with chronic intermittent nature of the symptoms and negative workups. At time of d/c patient states 'he just doesn't feel right' but can't expand upon that further. Will fu w/ pcp for same.   Final Clinical Impression(s) / ED Diagnoses Final diagnoses:  Dizziness    Rx / DC Orders ED Discharge Orders     None         Ameyah Bangura, Reymundo Caulk, MD 10/19/23 [phone]

## 2023-10-19 NOTE — ED Triage Notes (Signed)
 Pt arrives via POV with dizziness, palpitations, HA, and SHOB upon waking up from sleeping. Pt denies CP. Hx asthma, anxiety.

## 2023-10-21 ENCOUNTER — Encounter: Payer: Self-pay | Admitting: Internal Medicine

## 2023-10-21 ENCOUNTER — Ambulatory Visit (INDEPENDENT_AMBULATORY_CARE_PROVIDER_SITE_OTHER): Payer: MEDICAID | Admitting: Internal Medicine

## 2023-10-21 VITALS — BP 134/76 | HR 92 | Ht 73.0 in | Wt 193.6 lb

## 2023-10-21 DIAGNOSIS — F411 Generalized anxiety disorder: Secondary | ICD-10-CM | POA: Diagnosis not present

## 2023-10-21 DIAGNOSIS — J4530 Mild persistent asthma, uncomplicated: Secondary | ICD-10-CM

## 2023-10-21 DIAGNOSIS — F331 Major depressive disorder, recurrent, moderate: Secondary | ICD-10-CM | POA: Insufficient documentation

## 2023-10-21 DIAGNOSIS — K219 Gastro-esophageal reflux disease without esophagitis: Secondary | ICD-10-CM | POA: Diagnosis not present

## 2023-10-21 DIAGNOSIS — E782 Mixed hyperlipidemia: Secondary | ICD-10-CM

## 2023-10-21 DIAGNOSIS — E559 Vitamin D deficiency, unspecified: Secondary | ICD-10-CM

## 2023-10-21 DIAGNOSIS — Z113 Encounter for screening for infections with a predominantly sexual mode of transmission: Secondary | ICD-10-CM

## 2023-10-21 DIAGNOSIS — F4310 Post-traumatic stress disorder, unspecified: Secondary | ICD-10-CM

## 2023-10-21 MED ORDER — CITALOPRAM HYDROBROMIDE 10 MG PO TABS
10.0000 mg | ORAL_TABLET | Freq: Every day | ORAL | 1 refills | Status: DC
Start: 2023-10-21 — End: 2023-11-23

## 2023-10-21 MED ORDER — ESOMEPRAZOLE MAGNESIUM 40 MG PO CPDR: 40.0000 mg | DELAYED_RELEASE_CAPSULE | Freq: Every day | ORAL | 1 refills | Status: DC

## 2023-10-21 NOTE — Assessment & Plan Note (Addendum)
 Uncontrolled, has panic episodes Start Celexa 10 mg QD Continue clonazepam  0.5 mg twice daily as needed for panic episode Referred to [facility] therapy Check CBC, CMP, TSH, free T4

## 2023-10-21 NOTE — Assessment & Plan Note (Signed)
 Check lipid profile Was given atorvastatin 80 mg QD by previous provider, but has not been taking it

## 2023-10-21 NOTE — Assessment & Plan Note (Signed)
 Has epigastric pain, likely due to GERD/gastritis Uncontrolled with Pepcid   Continue Nexium  40 mg once daily Check H Pylori test before starting Nexium  again
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
## 2023-10-21 NOTE — Assessment & Plan Note (Addendum)
 Denies dysuria, rash or urethral discharge currently, but requests STD screening Check HIV antibody, HBsAg, hep C antibody, RPR, chlamydia and Neisseria testing
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
## 2023-10-21 NOTE — Assessment & Plan Note (Signed)
 Reports longstanding history of MDD Concern for bipolar disorder, considering episodes of anxiety and agitation Report of losing job and selling his car are also concerning signs Has separated from his partner, has 2 daughters, who are under their mother's custody He has a sister, who has been helping financially at times He has tried antidepressants in the past, names unknown -was prescribed Seroquel in the recent visit by previous PCP  Started Celexa 10 mg QD Referred to [facility] therapy  Patient and/or legal guardian verbally consented to [facility] services about presenting concerns and psychiatric consultation as appropriate.  The services will be billed as appropriate for the patient.

## 2023-10-21 NOTE — Assessment & Plan Note (Signed)
Well-controlled with Symbicort

## 2023-10-21 NOTE — Patient Instructions (Signed)
 Please start taking Celexa as prescribed.  Please continue taking Clonazepam  as prescribed for anxiety spells.  Please start taking Nexium  as prescribed.

## 2023-10-22 ENCOUNTER — Other Ambulatory Visit: Payer: Self-pay

## 2023-10-22 ENCOUNTER — Emergency Department (HOSPITAL_COMMUNITY)
Admission: EM | Admit: 2023-10-22 | Discharge: 2023-10-22 | Disposition: A | Discharge status: Home / Self Care | Payer: MEDICAID | Attending: Emergency Medicine | Admitting: Emergency Medicine

## 2023-10-22 ENCOUNTER — Ambulatory Visit: Payer: Self-pay | Admitting: Internal Medicine

## 2023-10-22 ENCOUNTER — Emergency Department (HOSPITAL_COMMUNITY): Payer: MEDICAID

## 2023-10-22 DIAGNOSIS — J45909 Unspecified asthma, uncomplicated: Secondary | ICD-10-CM | POA: Insufficient documentation

## 2023-10-22 DIAGNOSIS — R42 Dizziness and giddiness: Secondary | ICD-10-CM | POA: Diagnosis present

## 2023-10-22 DIAGNOSIS — Z7951 Long term (current) use of inhaled steroids: Secondary | ICD-10-CM | POA: Insufficient documentation

## 2023-10-22 DIAGNOSIS — R0602 Shortness of breath: Secondary | ICD-10-CM | POA: Diagnosis not present

## 2023-10-22 LAB — HIV ANTIBODY (ROUTINE TESTING W REFLEX): HIV Screen 4th Generation wRfx: NONREACTIVE

## 2023-10-22 LAB — CBC WITH DIFFERENTIAL/PLATELET
Abs Immature Granulocytes: 0 10*3/uL (ref 0.00–0.07)
Basophils Absolute: 0 10*3/uL (ref 0.0–0.2)
Basophils Absolute: 0.1 10*3/uL (ref 0.0–0.1)
Basophils Relative: 1 %
Basos: 1 %
EOS (ABSOLUTE): 0.3 10*3/uL (ref 0.0–0.4)
Eos: 9 %
Eosinophils Absolute: 0.3 10*3/uL (ref 0.0–0.5)
Eosinophils Relative: 6 %
HCT: 46.3 % (ref 39.0–52.0)
Hematocrit: 48.7 % (ref 37.5–51.0)
Hemoglobin: 16.2 g/dL (ref 13.0–17.0)
Hemoglobin: 16.9 g/dL (ref 13.0–17.7)
Immature Grans (Abs): 0 10*3/uL (ref 0.0–0.1)
Immature Granulocytes: 0 %
Immature Granulocytes: 0 %
Lymphocytes Absolute: 1.6 10*3/uL (ref 0.7–3.1)
Lymphocytes Relative: 52 %
Lymphs Abs: 2.7 10*3/uL (ref 0.7–4.0)
Lymphs: 44 %
MCH: 29.6 pg (ref 26.0–34.0)
MCH: 29.8 pg (ref 26.6–33.0)
MCHC: 34.7 g/dL (ref 31.5–35.7)
MCHC: 35 g/dL (ref 30.0–36.0)
MCV: 84.6 fL (ref 80.0–100.0)
MCV: 86 fL (ref 79–97)
Monocytes Absolute: 0.3 10*3/uL (ref 0.1–0.9)
Monocytes Absolute: 0.3 10*3/uL (ref 0.1–1.0)
Monocytes Relative: 7 %
Monocytes: 9 %
Neutro Abs: 1.7 10*3/uL (ref 1.7–7.7)
Neutrophils Absolute: 1.3 10*3/uL — ABNORMAL LOW (ref 1.4–7.0)
Neutrophils Relative %: 34 %
Neutrophils: 37 %
Platelets: 265 10*3/uL (ref 150–400)
Platelets: 286 10*3/uL (ref 150–450)
RBC: 5.47 MIL/uL (ref 4.22–5.81)
RBC: 5.68 x10E6/uL (ref 4.14–5.80)
RDW: 12.5 % (ref 11.5–15.5)
RDW: 12.8 % (ref 11.6–15.4)
WBC: 3.6 10*3/uL (ref 3.4–10.8)
WBC: 5.2 10*3/uL (ref 4.0–10.5)
nRBC: 0 % (ref 0.0–0.2)

## 2023-10-22 LAB — LIPID PANEL
Chol/HDL Ratio: 5.3 ratio — ABNORMAL HIGH (ref 0.0–5.0)
Cholesterol, Total: 210 mg/dL — ABNORMAL HIGH (ref 100–199)
HDL: 40 mg/dL (ref >39)
LDL Chol Calc (NIH): 132 mg/dL — ABNORMAL HIGH (ref 0–99)
Triglycerides: 214 mg/dL — ABNORMAL HIGH (ref 0–149)
VLDL Cholesterol Cal: 38 mg/dL (ref 5–40)

## 2023-10-22 LAB — CMP14+EGFR
ALT: 16 IU/L (ref 0–44)
AST: 20 IU/L (ref 0–40)
Albumin: 4.9 g/dL (ref 4.3–5.2)
Alkaline Phosphatase: 63 IU/L (ref 44–121)
BUN/Creatinine Ratio: 14 (ref 9–20)
BUN: 14 mg/dL (ref 6–20)
Bilirubin Total: 0.4 mg/dL (ref 0.0–1.2)
CO2: 25 mmol/L (ref 20–29)
Calcium: 10.2 mg/dL (ref 8.7–10.2)
Chloride: 101 mmol/L (ref 96–106)
Creatinine, Ser: 1 mg/dL (ref 0.76–1.27)
Globulin, Total: 2.4 g/dL (ref 1.5–4.5)
Glucose: 68 mg/dL — ABNORMAL LOW (ref 70–99)
Potassium: 4.4 mmol/L (ref 3.5–5.2)
Sodium: 143 mmol/L (ref 134–144)
Total Protein: 7.3 g/dL (ref 6.0–8.5)
eGFR: 104 mL/min/{1.73_m2} (ref >59)

## 2023-10-22 LAB — COMPREHENSIVE METABOLIC PANEL WITH GFR
ALT: 17 U/L (ref 0–44)
AST: 25 U/L (ref 15–41)
Albumin: 4.3 g/dL (ref 3.5–5.0)
Alkaline Phosphatase: 49 U/L (ref 38–126)
Anion gap: 10 (ref 5–15)
BUN: 16 mg/dL (ref 6–20)
CO2: 26 mmol/L (ref 22–32)
Calcium: 9.6 mg/dL (ref 8.9–10.3)
Chloride: 105 mmol/L (ref 98–111)
Creatinine, Ser: 0.99 mg/dL (ref 0.61–1.24)
GFR, Estimated: >60 mL/min (ref >60)
Glucose, Bld: 89 mg/dL (ref 70–99)
Potassium: 3.7 mmol/L (ref 3.5–5.1)
Sodium: 141 mmol/L (ref 135–145)
Total Bilirubin: 0.6 mg/dL (ref 0.0–1.2)
Total Protein: 6.9 g/dL (ref 6.5–8.1)

## 2023-10-22 LAB — HEPATITIS B SURFACE ANTIGEN: Hepatitis B Surface Ag: NEGATIVE

## 2023-10-22 LAB — RPR: RPR Ser Ql: NONREACTIVE

## 2023-10-22 LAB — HEPATITIS C ANTIBODY: Hep C Virus Ab: NONREACTIVE

## 2023-10-22 LAB — TSH+FREE T4
Free T4: 1.33 ng/dL (ref 0.82–1.77)
TSH: 0.564 u[IU]/mL (ref 0.450–4.500)

## 2023-10-22 LAB — CBG MONITORING, ED: Glucose-Capillary: 87 mg/dL (ref 70–99)

## 2023-10-22 LAB — VITAMIN D 25 HYDROXY (VIT D DEFICIENCY, FRACTURES): Vit D, 25-Hydroxy: 39.5 ng/mL (ref 30.0–100.0)

## 2023-10-22 NOTE — Discharge Instructions (Signed)
Follow up with your doctor for recheck.  °

## 2023-10-22 NOTE — ED Triage Notes (Signed)
 Patient reports SOB/lightheadedness this evening , alert and oriented, denies pain /ambulatory .

## 2023-10-22 NOTE — ED Provider Notes (Signed)
 [place] EMERGENCY DEPARTMENT AT [facility] Provider Note   CSN: [id number] Arrival date & time: 10/22/23  0003     History  Chief Complaint  Patient presents with   SOB/Lightheaded    Tracy [name] is a 28 y.o. male.  28 year old male presents with complaint of feeling dizzy earlier today while he was standing outside.  His symptoms have resolved while waiting in the emergency room.  Past medical history of asthma, bronchitis, anxiety, PTSD presented patient seen recently in this ER for same symptoms.       Home Medications Prior to Admission medications   Medication Sig Start Date End Date Taking? Authorizing Provider  budesonide-formoterol (SYMBICORT) 80-4.5 MCG/ACT inhaler Inhale 2 puffs into the lungs 2 (two) times daily as needed (Asthma).    [provider]  citalopram (CELEXA) 10 MG tablet Take 1 tablet (10 mg total) by mouth daily. 10/21/23   Meldon Sport, MD  clonazePAM  (KLONOPIN ) 0.5 MG tablet Take 1 tablet (0.5 mg total) by mouth daily. 08/06/23   Kommor, Madison, MD  esomeprazole  (NEXIUM ) 40 MG capsule Take 1 capsule (40 mg total) by mouth daily at 12 noon. 10/21/23   Meldon Sport, MD      Allergies    Penicillins    Review of Systems   Review of Systems Negative except as per HPI Physical Exam Updated Vital Signs BP (!) 155/83   Pulse 68   Temp 98.5 F (36.9 C)   Resp 18   SpO2 100%  Physical Exam Vitals and nursing note reviewed.  Constitutional:      General: He is not in acute distress.    Appearance: He is well-developed. He is not diaphoretic.  HENT:     Head: Normocephalic and atraumatic.  Cardiovascular:     Rate and Rhythm: Normal rate and regular rhythm.     Heart sounds: Normal heart sounds.  Pulmonary:     Effort: Pulmonary effort is normal.     Breath sounds: Normal breath sounds.  Abdominal:     Palpations: Abdomen is soft.     Tenderness: There is no abdominal tenderness.  Musculoskeletal:     Right  lower leg: No edema.     Left lower leg: No edema.  Skin:    General: Skin is warm and dry.     Findings: No erythema or rash.  Neurological:     Mental Status: He is alert and oriented to person, place, and time.  Psychiatric:        Behavior: Behavior normal.     ED Results / Procedures / Treatments   Labs (all labs ordered are listed, but only abnormal results are displayed) Labs Reviewed  CBC WITH DIFFERENTIAL/PLATELET  COMPREHENSIVE METABOLIC PANEL WITH GFR  CBG MONITORING, ED    EKG None  Radiology DG Chest 2 View Result Date: 10/22/2023 CLINICAL DATA:  Shortness of breath EXAM: CHEST - 2 VIEW COMPARISON:  10/19/2023 FINDINGS: The heart size and mediastinal contours are within normal limits. Both lungs are clear. The visualized skeletal structures are unremarkable. IMPRESSION: No active cardiopulmonary disease. Electronically Signed   By: Violeta Grey M.D.   On: 10/22/2023 00:57    Procedures Procedures    Medications Ordered in ED Medications - No data to display  ED Course/ Medical Decision Making/ A&P  Medical Decision Making Amount and/or Complexity of Data Reviewed Labs: ordered. Radiology: ordered.   28 year old male presents with concern for feeling dizzy earlier today.  This is since resolved.  His workup today is reassuring including CBC, CMP, CBG which are all within normal notes.  Chest x-ray as ordered inter by self is unremarkable.  Agree with radiology interpretation.  Patient has been worked up for similar complaint several times this month.  He is discharged advised to follow-up with his primary care provider.        Final Clinical Impression(s) / ED Diagnoses Final diagnoses:  Dizziness, nonspecific    Rx / DC Orders ED Discharge Orders     None         Darlis Eisenmenger, PA-C 10/22/23 0452    Alissa April, MD 10/22/23 [phone]

## 2023-10-25 LAB — CHLAMYDIA/GC NAA, CONFIRMATION
Chlamydia trachomatis, NAA: NEGATIVE
Neisseria gonorrhoeae, NAA: NEGATIVE

## 2023-10-25 LAB — TOXASSURE SELECT 13 (MW), URINE

## 2023-11-08 ENCOUNTER — Other Ambulatory Visit: Payer: Self-pay

## 2023-11-08 ENCOUNTER — Emergency Department (HOSPITAL_COMMUNITY): Payer: MEDICAID

## 2023-11-08 ENCOUNTER — Encounter (HOSPITAL_COMMUNITY): Payer: Self-pay

## 2023-11-08 ENCOUNTER — Emergency Department (HOSPITAL_COMMUNITY)
Admission: EM | Admit: 2023-11-08 | Discharge: 2023-11-08 | Disposition: A | Discharge status: Home / Self Care | Payer: MEDICAID | Attending: Emergency Medicine | Admitting: Emergency Medicine

## 2023-11-08 DIAGNOSIS — R0789 Other chest pain: Secondary | ICD-10-CM | POA: Diagnosis present

## 2023-11-08 LAB — BASIC METABOLIC PANEL WITH GFR
Anion gap: 6 (ref 5–15)
BUN: 24 mg/dL — ABNORMAL HIGH (ref 6–20)
CO2: 28 mmol/L (ref 22–32)
Calcium: 9 mg/dL (ref 8.9–10.3)
Chloride: 104 mmol/L (ref 98–111)
Creatinine, Ser: 1.1 mg/dL (ref 0.61–1.24)
GFR, Estimated: >60 mL/min (ref >60)
Glucose, Bld: 105 mg/dL — ABNORMAL HIGH (ref 70–99)
Potassium: 3.7 mmol/L (ref 3.5–5.1)
Sodium: 138 mmol/L (ref 135–145)

## 2023-11-08 LAB — CBC
HCT: 44.1 % (ref 39.0–52.0)
Hemoglobin: 15.1 g/dL (ref 13.0–17.0)
MCH: 29.5 pg (ref 26.0–34.0)
MCHC: 34.2 g/dL (ref 30.0–36.0)
MCV: 86.3 fL (ref 80.0–100.0)
Platelets: 249 10*3/uL (ref 150–400)
RBC: 5.11 MIL/uL (ref 4.22–5.81)
RDW: 12.4 % (ref 11.5–15.5)
WBC: 4.6 10*3/uL (ref 4.0–10.5)
nRBC: 0 % (ref 0.0–0.2)

## 2023-11-08 LAB — TROPONIN I (HIGH SENSITIVITY)
Troponin I (High Sensitivity): 10 ng/L (ref <18)
Troponin I (High Sensitivity): 10 ng/L (ref <18)

## 2023-11-08 NOTE — ED Provider Notes (Signed)
 [place] EMERGENCY DEPARTMENT AT [facility] Provider Note   CSN: [id number] Arrival date & time: 11/08/23  0104     History  Chief Complaint  Patient presents with   Chest Pain    Tracy [name] is a 28 y.o. male.  The history is provided by the patient.  Chest Pain Tracy [name] is a 28 y.o. male who presents to the Emergency Department complaining of chest pain.  He presents to the emergency department for evaluation of central chest pain that woke him from sleep about 45 minutes prior to ED arrival.  He describes it as a mild discomfort with associated lightheadedness and dizziness.  Overall his symptoms are resolving.  He does have a history of anxiety and thinks that his pain might be secondary to that.  No tobacco, alcohol, drug use.  He takes Klonopin , no additional medications.  He has a family history of diabetes and asthma.   Home Medications Prior to Admission medications   Medication Sig Start Date End Date Taking? Authorizing Provider  budesonide-formoterol (SYMBICORT) 80-4.5 MCG/ACT inhaler Inhale 2 puffs into the lungs 2 (two) times daily as needed (Asthma).    [provider]  citalopram  (CELEXA ) 10 MG tablet Take 1 tablet (10 mg total) by mouth daily. Patient not taking: Reported on 11/08/2023 10/21/23   Meldon Sport, MD  clonazePAM  (KLONOPIN ) 0.5 MG tablet Take 1 tablet (0.5 mg total) by mouth daily. Patient not taking: Reported on 11/08/2023 08/06/23   Kommor, Alyse July, MD  esomeprazole  (NEXIUM ) 40 MG capsule Take 1 capsule (40 mg total) by mouth daily at 12 noon. Patient not taking: Reported on 11/08/2023 10/21/23   Meldon Sport, MD      Allergies    Penicillins    Review of Systems   Review of Systems  Cardiovascular:  Positive for chest pain.  All other systems reviewed and are negative.   Physical Exam Updated Vital Signs BP 123/81   Pulse (!) 57   Temp 98.4 F (36.9 C)   Resp 15   Ht 6\' 1"  (1.854 m)   Wt 86.2 kg   SpO2 96%    BMI 25.07 kg/m  Physical Exam Vitals and nursing note reviewed.  Constitutional:      Appearance: He is well-developed.  HENT:     Head: Normocephalic and atraumatic.  Cardiovascular:     Rate and Rhythm: Normal rate and regular rhythm.     Heart sounds: No murmur heard. Pulmonary:     Effort: Pulmonary effort is normal. No respiratory distress.     Breath sounds: Normal breath sounds.  Abdominal:     Palpations: Abdomen is soft.     Tenderness: There is no abdominal tenderness. There is no guarding or rebound.  Musculoskeletal:        General: No swelling or tenderness.     Comments: 2+ DP pulses  Skin:    General: Skin is warm and dry.  Neurological:     Mental Status: He is alert and oriented to person, place, and time.  Psychiatric:        Behavior: Behavior normal.     ED Results / Procedures / Treatments   Labs (all labs ordered are listed, but only abnormal results are displayed) Labs Reviewed  BASIC METABOLIC PANEL WITH GFR - Abnormal; Notable for the following components:      Result Value   Glucose, Bld 105 (*)    BUN 24 (*)    All  other components within normal limits  CBC  TROPONIN I (HIGH SENSITIVITY)  TROPONIN I (HIGH SENSITIVITY)    EKG EKG Interpretation Date/Time:  Monday November 08 2023 01:24:28 EDT Ventricular Rate:  72 PR Interval:  138 QRS Duration:  83 QT Interval:  366 QTC Calculation: 401 R Axis:   70  Text Interpretation: Sinus rhythm Borderline T wave abnormalities Confirmed by Kelsey Patricia [phone]) on 11/08/2023 1:29:21 AM  Radiology DG Chest 2 View Result Date: 11/08/2023 CLINICAL DATA:  Chest pain EXAM: CHEST - 2 VIEW COMPARISON:  10/22/2023 FINDINGS: The heart size and mediastinal contours are within normal limits. Both lungs are clear. The visualized skeletal structures are unremarkable. IMPRESSION: No active cardiopulmonary disease. Electronically Signed   By: Violeta Grey M.D.   On: 11/08/2023 02:01    Procedures Procedures     Medications Ordered in ED Medications - No data to display  ED Course/ Medical Decision Making/ A&P                                 Medical Decision Making Amount and/or Complexity of Data Reviewed Labs: ordered. Radiology: ordered.   Pt here for evaluation of chest pain that is resolved at time of ED evaluation.  EKG is similar when compared to priors and troponin is neg x 2.  Current picture is not c/w ACS, dissection.  Doubt PE - perc negative. CXR neg for pna- images personally reviewed and interpreted, agree with radiologist interpretation.  D/w need for outpatient follow up for re-eval with return precautions.          Final Clinical Impression(s) / ED Diagnoses Final diagnoses:  Atypical chest pain    Rx / DC Orders ED Discharge Orders     None         Kelsey Patricia, MD 11/08/23 [phone]

## 2023-11-08 NOTE — ED Triage Notes (Signed)
 Patient arrives with chest discomfort. States it woke him up from sleep. States he does not know if it is related to an anxiety attack or acid reflux. Rates chest discomfort 6/10. States his hands became tingly and sweaty.

## 2023-11-23 ENCOUNTER — Telehealth: Payer: Self-pay

## 2023-11-23 ENCOUNTER — Ambulatory Visit: Payer: MEDICAID | Admitting: Gastroenterology

## 2023-11-23 ENCOUNTER — Telehealth: Payer: Self-pay | Admitting: Internal Medicine

## 2023-11-23 DIAGNOSIS — F411 Generalized anxiety disorder: Secondary | ICD-10-CM

## 2023-11-23 DIAGNOSIS — K219 Gastro-esophageal reflux disease without esophagitis: Secondary | ICD-10-CM

## 2023-11-23 DIAGNOSIS — F331 Major depressive disorder, recurrent, moderate: Secondary | ICD-10-CM

## 2023-11-23 NOTE — Telephone Encounter (Unsigned)
 Copied from CRM [id number]. Topic: Clinical - Medication Refill >> Nov 23, 2023  3:53 PM Ameerah G wrote: Medication: citalopram  (CELEXA ) 10 MG tablet [[id number]],$MzfnczAzqnmzIZPI_MImRQWAJJTAlHvudxMGHgtkHdRqotaww$$MzfnczAzqnmzIZPI_MImRQWAJJTAlHvudxMGHgtkHdRqotaww$  (KLONOPIN ) 0.5 MG tablet [[id number]],$MzfnczAzqnmzIZPI_NEXFvnrhWVqJNeSyUrHqseMXXmfqEvHW$$MzfnczAzqnmzIZPI_NEXFvnrhWVqJNeSyUrHqseMXXmfqEvHW$  (NEXIUM ) 40 MG capsule [[id number]]  Has the patient contacted their pharmacy? Yes (Agent: If no, request that the patient contact the pharmacy for the refill. If patient does not wish to contact the pharmacy document the reason why and proceed with request.) (Agent: If yes, when and what did the pharmacy advise?)  This is the patient's preferred pharmacy:  [facility] [address] [place] - [address]  [address] [place] [address] Phone: [phone] Fax: [phone]  Is this the correct pharmacy for this prescription? Yes If no, delete pharmacy and type the correct one.   Has the prescription been filled recently? No  Is the patient out of the medication? No clonazePAM  (KLONOPIN ) 0.5 MG tablet  Has the patient been seen for an appointment in the last year OR does the patient have an upcoming appointment? Yes  Can we respond through MyChart? Yes  Agent: Please be advised that Rx refills may take up to 3 business days. We ask that you follow-up with your pharmacy.

## 2023-11-23 NOTE — Telephone Encounter (Signed)
 Copied from CRM [id number]. Topic: Clinical - Medication Question >> Nov 23, 2023  3:47 PM Avram MATSU wrote: Reason for CRM: clonazePAM  (KLONOPIN ) 0.5 MG tablet [[id number]] is this medication okay to drink with kabocha? Patient would like to know because he had one recently.

## 2023-11-24 MED ORDER — CLONAZEPAM 0.5 MG PO TABS: 0.5000 mg | ORAL_TABLET | Freq: Every day | ORAL | 0 refills | Status: DC

## 2023-11-24 MED ORDER — CITALOPRAM HYDROBROMIDE 10 MG PO TABS
10.0000 mg | ORAL_TABLET | Freq: Every day | ORAL | 1 refills | Status: DC
Start: 2023-11-24 — End: 2023-12-24

## 2023-11-24 MED ORDER — ESOMEPRAZOLE MAGNESIUM 40 MG PO CPDR
40.0000 mg | DELAYED_RELEASE_CAPSULE | Freq: Every day | ORAL | 1 refills | Status: AC
Start: 2023-11-24 — End: ?

## 2023-11-24 NOTE — Telephone Encounter (Signed)
 Patient advised.

## 2023-11-27 ENCOUNTER — Emergency Department (HOSPITAL_COMMUNITY)
Admission: EM | Admit: 2023-11-27 | Discharge: 2023-11-27 | Disposition: A | Discharge status: Home / Self Care | Payer: MEDICAID | Attending: Student | Admitting: Student

## 2023-11-27 ENCOUNTER — Encounter (HOSPITAL_COMMUNITY): Payer: Self-pay

## 2023-11-27 ENCOUNTER — Other Ambulatory Visit: Payer: Self-pay

## 2023-11-27 ENCOUNTER — Emergency Department (HOSPITAL_COMMUNITY): Payer: MEDICAID

## 2023-11-27 DIAGNOSIS — K219 Gastro-esophageal reflux disease without esophagitis: Secondary | ICD-10-CM | POA: Insufficient documentation

## 2023-11-27 DIAGNOSIS — D72819 Decreased white blood cell count, unspecified: Secondary | ICD-10-CM | POA: Insufficient documentation

## 2023-11-27 DIAGNOSIS — R0789 Other chest pain: Secondary | ICD-10-CM | POA: Diagnosis present

## 2023-11-27 LAB — CBC
HCT: 49.2 % (ref 39.0–52.0)
Hemoglobin: 16.9 g/dL (ref 13.0–17.0)
MCH: 29.9 pg (ref 26.0–34.0)
MCHC: 34.3 g/dL (ref 30.0–36.0)
MCV: 87.1 fL (ref 80.0–100.0)
Platelets: 248 10*3/uL (ref 150–400)
RBC: 5.65 MIL/uL (ref 4.22–5.81)
RDW: 12.6 % (ref 11.5–15.5)
WBC: 3.6 10*3/uL — ABNORMAL LOW (ref 4.0–10.5)
nRBC: 0 % (ref 0.0–0.2)

## 2023-11-27 LAB — BASIC METABOLIC PANEL WITH GFR
Anion gap: 9 (ref 5–15)
BUN: 14 mg/dL (ref 6–20)
CO2: 28 mmol/L (ref 22–32)
Calcium: 9.8 mg/dL (ref 8.9–10.3)
Chloride: 103 mmol/L (ref 98–111)
Creatinine, Ser: 0.77 mg/dL (ref 0.61–1.24)
GFR, Estimated: >60 mL/min (ref >60)
Glucose, Bld: 95 mg/dL (ref 70–99)
Potassium: 3.9 mmol/L (ref 3.5–5.1)
Sodium: 140 mmol/L (ref 135–145)

## 2023-11-27 LAB — TROPONIN I (HIGH SENSITIVITY)
Troponin I (High Sensitivity): 13 ng/L (ref <18)
Troponin I (High Sensitivity): 11 ng/L (ref <18)

## 2023-11-27 MED ORDER — ALUM & MAG HYDROXIDE-SIMETH 200-200-20 MG/5ML PO SUSP
30.0000 mL | Freq: Once | ORAL | Status: AC
  Administered 2023-11-27: 30 mL via ORAL
  Filled 2023-11-27: qty 30

## 2023-11-27 MED ORDER — IBUPROFEN 800 MG PO TABS
800.0000 mg | ORAL_TABLET | Freq: Once | ORAL | Status: AC
  Administered 2023-11-27: 800 mg via ORAL
  Filled 2023-11-27: qty 1

## 2023-11-27 MED ORDER — FAMOTIDINE 20 MG PO TABS
20.0000 mg | ORAL_TABLET | Freq: Once | ORAL | Status: AC
  Administered 2023-11-27: 20 mg via ORAL
  Filled 2023-11-27: qty 1

## 2023-11-27 NOTE — ED Triage Notes (Signed)
 Pt arrives via POV. Pt reports chest pain that started about 5 mins ago. Pt reports associated lightheadedness. Pt states he does feel anxious. Pt was seen on the 9th of this month and cardiac workup was normal. Pt states pain feels like acid reflux. Pt AxOx4.

## 2023-11-27 NOTE — ED Provider Notes (Signed)
 [place] EMERGENCY DEPARTMENT AT [facility] Provider Note   CSN: [id number] Arrival date & time: 11/27/23  1547     Patient presents with: No chief complaint on file.   Tracy [name] is a 28 y.o. male.  Patient with past history significant for anxiety, depression, mixed hyperlipidemia, GERD presents the emergency department concerns of chest pain and shortness of breath.  He reports onset of chest pain about 5 minutes prior to arriving.  He endorses some feelings of lightheadedness.  Also endorsing some intermittent episodes of headaches that respond well to Tylenol .  Reports recently seen with an unremarkable workup.  He reports that this pain feels somewhat like acid reflux.  HPI     Prior to Admission medications   Medication Sig Start Date End Date Taking? Authorizing Provider  budesonide-formoterol (SYMBICORT) 80-4.5 MCG/ACT inhaler Inhale 2 puffs into the lungs 2 (two) times daily as needed (Asthma).    [provider]  citalopram  (CELEXA ) 10 MG tablet Take 1 tablet (10 mg total) by mouth daily. 11/24/23   Tobie Suzzane POUR, MD  clonazePAM  (KLONOPIN ) 0.5 MG tablet Take 1 tablet (0.5 mg total) by mouth daily. 11/24/23   Tobie Suzzane POUR, MD  esomeprazole  (NEXIUM ) 40 MG capsule Take 1 capsule (40 mg total) by mouth daily at 12 noon. 11/24/23   Tobie Suzzane POUR, MD    Allergies: Penicillins    Review of Systems  Cardiovascular:  Positive for chest pain.  All other systems reviewed and are negative.   Updated Vital Signs BP 131/85   Pulse 62   Temp 98.7 F (37.1 C)   Resp 18   Ht 6' 1 (1.854 m)   Wt 86.2 kg   SpO2 100%   BMI 25.07 kg/m   Physical Exam Vitals and nursing note reviewed.  Constitutional:      General: He is not in acute distress.    Appearance: He is well-developed.  HENT:     Head: Normocephalic and atraumatic.   Eyes:     Conjunctiva/sclera: Conjunctivae normal.    Cardiovascular:     Rate and Rhythm: Normal rate and  regular rhythm.     Heart sounds: No murmur heard. Pulmonary:     Effort: Pulmonary effort is normal. No respiratory distress.     Breath sounds: Normal breath sounds.  Abdominal:     Palpations: Abdomen is soft.     Tenderness: There is no abdominal tenderness.   Musculoskeletal:        General: No swelling.     Cervical back: Neck supple.   Skin:    General: Skin is warm and dry.     Capillary Refill: Capillary refill takes less than 2 seconds.   Neurological:     Mental Status: He is alert.   Psychiatric:        Mood and Affect: Mood normal.     (all labs ordered are listed, but only abnormal results are displayed) Labs Reviewed  CBC - Abnormal; Notable for the following components:      Result Value   WBC 3.6 (*)    All other components within normal limits  BASIC METABOLIC PANEL WITH GFR  TROPONIN I (HIGH SENSITIVITY)  TROPONIN I (HIGH SENSITIVITY)    EKG: None  Radiology: DG Chest 2 View Result Date: 11/27/2023 CLINICAL DATA:  Chest pain.  Lightheadedness. EXAM: CHEST - 2 VIEW COMPARISON:  11/08/2023 FINDINGS: The cardiomediastinal contours are normal. The lungs are clear. Pulmonary vasculature is normal.  No consolidation, pleural effusion, or pneumothorax. No acute osseous abnormalities are seen. IMPRESSION: No active cardiopulmonary disease. Electronically Signed   By: Andrea Gasman M.D.   On: 11/27/2023 17:06     Procedures   Medications Ordered in the ED  alum & mag hydroxide-simeth (MAALOX/MYLANTA) 200-200-20 MG/5ML suspension 30 mL (30 mLs Oral Given 11/27/23 1724)  famotidine  (PEPCID ) tablet 20 mg (20 mg Oral Given 11/27/23 1724)  ibuprofen  (ADVIL ) tablet 800 mg (800 mg Oral Given 11/27/23 1724)                                    Medical Decision Making Amount and/or Complexity of Data Reviewed Labs: ordered. Radiology: ordered.  Risk OTC drugs. Prescription drug management.   This patient presents to the ED for concern of chest pain,  shortness of breath, this involves an extensive number of treatment options, and is a complaint that carries with it a high risk of complications and morbidity.  The differential diagnosis includes ACS, PE, pneumonia, bronchitis, GERD   Co morbidities that complicate the patient evaluation  Anxiety, depression   Lab Tests:  I Ordered, and personally interpreted labs.  The pertinent results include: CBC with mild leukopenia 3.6, BMP unremarkable, troponin initially at 13 with repeat at 11   Imaging Studies ordered:  I ordered imaging studies including chest x-ray I independently visualized and interpreted imaging which showed no acute cardiopulmonary process I agree with the radiologist interpretation   Cardiac Monitoring: / EKG:  The patient was maintained on a cardiac monitor.  I personally viewed and interpreted the cardiac monitored which showed an underlying rhythm of: Normal sinus rhythm   Consultations Obtained:  I requested consultation with none,  and discussed lab and imaging findings as well as pertinent plan - they recommend: N/A   Problem List / ED Course / Critical interventions / Medication management  Patient presents to the emergency department today with concerns of chest pain shortness of breath.  Reports has been ongoing off and on for several weeks but initially started about 5 minutes prior to arriving.  Endorses some associated lightheadedness.  States he does feel anxious and is unsure if this is due to his anxiety.  He states that he has been seen by his PCP is concerned for GERD and started on PPIs but has not taken this. On exam, patient is well-appearing with no evident respiratory distress.  Vitals unremarkable.  EKG shows normal sinus rhythm.  Will workup with cardiac labs. Basic labs unremarkable.  Troponin actually at 13 will repeat troponin.  Initiating GI cocktail with famotidine  and Maalox.  Ibuprofen  given for headache. Repeat troponin at 11.   Doubt ACS.  PERC negative.  Doubt PE.  With reassuring workup and improvement in symptoms after GI cocktail and ibuprofen , will discharge home with plans for outpatient follow-up.  Suspect this is likely GERD mediated response.  Discussed return precautions such as concerns for new or worsening symptoms.  Otherwise stable this time for outpatient follow-up and discharged home. I ordered medication including Pepcid , Maalox, ibuprofen  for GI cocktail, headache Reevaluation of the patient after these medicines showed that the patient improved I have reviewed the patients home medicines and have made adjustments as needed   Test / Admission - Considered:  Admission considered but given negative workup, discharged home for outpatient follow-up.  Final diagnoses:  Other chest pain  Gastroesophageal reflux disease, unspecified whether esophagitis  present    ED Discharge Orders     None          Arrielle Mcginn A, PA-C 11/27/23 1919    Albertina Dixon, MD 11/28/23 [phone]

## 2023-11-27 NOTE — Discharge Instructions (Signed)
 You were seen in the ER today for concerns of chest pain and shortness of breath. Your labs, EKG, and imaging were thankfully normal with no abnormal findings seen. Please follow up with your primary care provider for further evaluation. Return to the ER for any concerns of new or worsening symptoms.

## 2023-12-14 ENCOUNTER — Other Ambulatory Visit: Payer: Self-pay

## 2023-12-14 ENCOUNTER — Emergency Department (HOSPITAL_COMMUNITY)
Admission: EM | Admit: 2023-12-14 | Discharge: 2023-12-14 | Disposition: A | Discharge status: Home / Self Care | Payer: MEDICAID | Attending: Emergency Medicine | Admitting: Emergency Medicine

## 2023-12-14 DIAGNOSIS — F410 Panic disorder [episodic paroxysmal anxiety] without agoraphobia: Secondary | ICD-10-CM | POA: Insufficient documentation

## 2023-12-14 DIAGNOSIS — Z7951 Long term (current) use of inhaled steroids: Secondary | ICD-10-CM | POA: Insufficient documentation

## 2023-12-14 DIAGNOSIS — Z8616 Personal history of COVID-19: Secondary | ICD-10-CM | POA: Diagnosis not present

## 2023-12-14 DIAGNOSIS — J45909 Unspecified asthma, uncomplicated: Secondary | ICD-10-CM | POA: Insufficient documentation

## 2023-12-14 MED ORDER — LIDOCAINE VISCOUS HCL 2 % MT SOLN
15.0000 mL | Freq: Once | OROMUCOSAL | Status: AC
  Administered 2023-12-14: 15 mL via ORAL
  Filled 2023-12-14: qty 15

## 2023-12-14 MED ORDER — ALUM & MAG HYDROXIDE-SIMETH 200-200-20 MG/5ML PO SUSP
30.0000 mL | Freq: Once | ORAL | Status: AC
  Administered 2023-12-14: 30 mL via ORAL
  Filled 2023-12-14: qty 30
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
## 2023-12-14 NOTE — ED Triage Notes (Signed)
 Patient c/o being sleep and waking up and his heart racing feeling lightheaded and dizzy. Patient also c/o chest discomfort.  Hx anxiety

## 2023-12-19 ENCOUNTER — Encounter (HOSPITAL_COMMUNITY): Payer: Self-pay | Admitting: Emergency Medicine

## 2023-12-19 ENCOUNTER — Emergency Department (HOSPITAL_COMMUNITY)
Admission: EM | Admit: 2023-12-19 | Discharge: 2023-12-19 | Disposition: A | Discharge status: Home / Self Care | Payer: MEDICAID | Attending: Emergency Medicine | Admitting: Emergency Medicine

## 2023-12-19 ENCOUNTER — Other Ambulatory Visit: Payer: Self-pay

## 2023-12-19 DIAGNOSIS — R519 Headache, unspecified: Secondary | ICD-10-CM

## 2023-12-19 DIAGNOSIS — R42 Dizziness and giddiness: Secondary | ICD-10-CM | POA: Insufficient documentation

## 2023-12-19 DIAGNOSIS — F419 Anxiety disorder, unspecified: Secondary | ICD-10-CM

## 2023-12-19 MED ORDER — CLONAZEPAM 0.5 MG PO TABS
0.5000 mg | ORAL_TABLET | ORAL | Status: AC
  Administered 2023-12-19: 0.5 mg via ORAL
  Filled 2023-12-19: qty 1

## 2023-12-19 MED ORDER — ACETAMINOPHEN 325 MG PO TABS
650.0000 mg | ORAL_TABLET | Freq: Once | ORAL | Status: AC
  Administered 2023-12-19: 650 mg via ORAL
  Filled 2023-12-19: qty 2

## 2023-12-19 NOTE — ED Triage Notes (Signed)
 Pt reports waking up from sleep with dizziness and pressure behind his right eye, right ear fullness

## 2023-12-19 NOTE — ED Provider Notes (Signed)
 [place] EMERGENCY DEPARTMENT AT [facility] Provider Note   CSN: [id number] Arrival date & time: 12/19/23  9353     Patient presents with: Dizziness   Tracy [name] is a 28 y.o. male.   28 year old male with history of severe anxiety presents with progressive his head feeling funny.  States that it started this morning when he woke up where he felt a strange sensation on the right side of his head which went to his right eye.  Denied any vision loss.  No eye pain itself.  Did not get much sleep last night.  Denies any use of alcohol drugs.  States has been off his Klonopin  and feels that his anxiety is worse.  Denies any HI or SI.  No focal neurological is.  Denies any prior history of glaucoma.  Does not wear contact lens.  No treatment use prior to arrival       Prior to Admission medications   Medication Sig Start Date End Date Taking? Authorizing Provider  budesonide-formoterol (SYMBICORT) 80-4.5 MCG/ACT inhaler Inhale 2 puffs into the lungs 2 (two) times daily as needed (Asthma).    [provider]  citalopram  (CELEXA ) 10 MG tablet Take 1 tablet (10 mg total) by mouth daily. 11/24/23   Tobie Suzzane POUR, MD  clonazePAM  (KLONOPIN ) 0.5 MG tablet Take 1 tablet (0.5 mg total) by mouth daily. 11/24/23   Tobie Suzzane POUR, MD  esomeprazole  (NEXIUM ) 40 MG capsule Take 1 capsule (40 mg total) by mouth daily at 12 noon. 11/24/23   Tobie Suzzane POUR, MD    Allergies: Penicillins    Review of Systems  All other systems reviewed and are negative.   Updated Vital Signs BP 138/85 (BP Location: Left Arm)   Pulse 65   Temp 97.9 F (36.6 C) (Oral)   Resp 17   Ht 1.854 m (6' 1)   Wt 86.2 kg   SpO2 97%   BMI 25.07 kg/m   Physical Exam Vitals and nursing note reviewed.  Constitutional:      General: He is not in acute distress.    Appearance: Normal appearance. He is well-developed. He is not toxic-appearing.  HENT:     Head: Normocephalic and atraumatic.  Eyes:      General: Lids are normal.     Extraocular Movements: Extraocular movements intact.     Conjunctiva/sclera: Conjunctivae normal.     Pupils: Pupils are equal, round, and reactive to light.     Comments: Globe nontender to palpation  Neck:     Thyroid : No thyroid  mass.     Trachea: No tracheal deviation.  Cardiovascular:     Rate and Rhythm: Normal rate and regular rhythm.     Heart sounds: Normal heart sounds. No murmur heard.    No gallop.  Pulmonary:     Effort: Pulmonary effort is normal. No respiratory distress.     Breath sounds: Normal breath sounds. No stridor. No decreased breath sounds, wheezing, rhonchi or rales.  Abdominal:     General: There is no distension.     Palpations: Abdomen is soft.     Tenderness: There is no abdominal tenderness. There is no rebound.  Musculoskeletal:        General: No tenderness. Normal range of motion.     Cervical back: Normal range of motion and neck supple.  Skin:    General: Skin is warm and dry.     Findings: No abrasion or rash.  Neurological:  General: No focal deficit present.     Mental Status: He is alert and oriented to person, place, and time. Mental status is at baseline.     GCS: GCS eye subscore is 4. GCS verbal subscore is 5. GCS motor subscore is 6.     Cranial Nerves: No cranial nerve deficit.     Sensory: No sensory deficit.     Motor: Motor function is intact.  Psychiatric:        Attention and Perception: Attention normal.        Mood and Affect: Mood is anxious.        Speech: Speech normal.        Behavior: Behavior normal.        Thought Content: Thought content does not include homicidal or suicidal ideation.     (all labs ordered are listed, but only abnormal results are displayed) Labs Reviewed - No data to display  EKG: None  Radiology: No results found.   Procedures   Medications Ordered in the ED  acetaminophen  (TYLENOL ) tablet 650 mg (has no administration in time range)  clonazePAM   (KLONOPIN ) tablet 0.5 mg (has no administration in time range)                                    Medical Decision Making Risk OTC drugs. Prescription drug management.   Patient given long Klonopin  here and feels better.  Will contact his doctor tomorrow for refill.  Suspect some anxiety component to this.  No concern for serious pathology such as glaucoma or intracranial process.  Neurological exam is stable and will discharge     Final diagnoses:  None    ED Discharge Orders     None          Dasie Faden, MD 12/19/23 [phone]

## 2023-12-19 NOTE — Discharge Instructions (Signed)
 Call your doctor tomorrow to get a refill of your Klonopin 

## 2023-12-22 ENCOUNTER — Encounter (HOSPITAL_COMMUNITY): Payer: Self-pay

## 2023-12-22 ENCOUNTER — Emergency Department (HOSPITAL_COMMUNITY)
Admission: EM | Admit: 2023-12-22 | Discharge: 2023-12-22 | Discharge status: Against Medical Advice | Payer: MEDICAID | Attending: Emergency Medicine | Admitting: Emergency Medicine

## 2023-12-22 ENCOUNTER — Other Ambulatory Visit: Payer: Self-pay

## 2023-12-22 DIAGNOSIS — S0096XA Insect bite (nonvenomous) of unspecified part of head, initial encounter: Secondary | ICD-10-CM | POA: Insufficient documentation

## 2023-12-22 DIAGNOSIS — Z5321 Procedure and treatment not carried out due to patient leaving prior to being seen by health care provider: Secondary | ICD-10-CM | POA: Diagnosis not present

## 2023-12-22 DIAGNOSIS — W57XXXA Bitten or stung by nonvenomous insect and other nonvenomous arthropods, initial encounter: Secondary | ICD-10-CM | POA: Diagnosis not present

## 2023-12-22 NOTE — ED Triage Notes (Signed)
 Pt reports being stung by a bee and has swelling to the top of his head. No additional symptoms at this time.

## 2023-12-22 NOTE — ED Notes (Signed)
 Patient threw away labels and bracelet and walked out the door

## 2023-12-23 ENCOUNTER — Emergency Department (HOSPITAL_COMMUNITY)
Admission: EM | Admit: 2023-12-23 | Discharge: 2023-12-23 | Disposition: A | Discharge status: Home / Self Care | Payer: MEDICAID | Attending: Emergency Medicine | Admitting: Emergency Medicine

## 2023-12-23 ENCOUNTER — Other Ambulatory Visit: Payer: Self-pay

## 2023-12-23 DIAGNOSIS — R519 Headache, unspecified: Secondary | ICD-10-CM | POA: Diagnosis present

## 2023-12-23 DIAGNOSIS — T63441A Toxic effect of venom of bees, accidental (unintentional), initial encounter: Secondary | ICD-10-CM | POA: Diagnosis not present

## 2023-12-23 LAB — CBG MONITORING, ED: Glucose-Capillary: 104 mg/dL — ABNORMAL HIGH (ref 70–99)

## 2023-12-23 MED ORDER — KETOROLAC TROMETHAMINE 30 MG/ML IJ SOLN
30.0000 mg | Freq: Once | INTRAMUSCULAR | Status: DC
  Filled 2023-12-23: qty 1

## 2023-12-23 MED ORDER — LORATADINE 10 MG PO TABS
10.0000 mg | ORAL_TABLET | Freq: Once | ORAL | Status: AC
  Administered 2023-12-23: 10 mg via ORAL
  Filled 2023-12-23 (×2): qty 1

## 2023-12-23 MED ORDER — FAMOTIDINE 20 MG PO TABS
20.0000 mg | ORAL_TABLET | Freq: Once | ORAL | Status: AC
  Administered 2023-12-23: 20 mg via ORAL
  Filled 2023-12-23 (×2): qty 1

## 2023-12-23 MED ORDER — FAMOTIDINE 20 MG PO TABS
20.0000 mg | ORAL_TABLET | Freq: Two times a day (BID) | ORAL | 0 refills | Status: AC
Start: 2023-12-23 — End: ?

## 2023-12-23 MED ORDER — PREDNISONE 20 MG PO TABS: ORAL_TABLET | ORAL | 0 refills | Status: DC

## 2023-12-23 MED ORDER — CETIRIZINE HCL 10 MG PO TABS: 10.0000 mg | ORAL_TABLET | Freq: Every day | ORAL | 0 refills | Status: AC

## 2023-12-23 NOTE — ED Provider Notes (Signed)
 [place] EMERGENCY DEPARTMENT AT [facility] Provider Note   CSN: [id number] Arrival date & time: 12/23/23  0109     Patient presents with: Headache   Tracy [name] is a 28 y.o. male.   The history is provided by the patient.  Headache Location: top of the head at the site of a bee sting. Onset quality:  Sudden Duration:  16 hours Timing:  Constant Progression:  Unchanged Chronicity:  New Context: not activity   Relieved by:  Nothing Worsened by:  Nothing Ineffective treatments:  None tried Associated symptoms: no fever, no tingling, no URI, no visual change, no vomiting and no weakness   Risk factors: no anger   States stung on top of the head with mild pain and swelling.  Also off his Klonopin .       Prior to Admission medications   Medication Sig Start Date End Date Taking? Authorizing Provider  cetirizine  (ZYRTEC  ALLERGY) 10 MG tablet Take 1 tablet (10 mg total) by mouth daily. 12/23/23  Yes Byrne Capek, MD  famotidine  (PEPCID ) 20 MG tablet Take 1 tablet (20 mg total) by mouth 2 (two) times daily. 12/23/23  Yes Lafreda Casebeer, MD  predniSONE  (DELTASONE ) 20 MG tablet 3 tabs po day one, then 2 po daily x 4 days 12/23/23  Yes Jamarr Treinen, MD  budesonide-formoterol (SYMBICORT) 80-4.5 MCG/ACT inhaler Inhale 2 puffs into the lungs 2 (two) times daily as needed (Asthma).    [provider]  citalopram  (CELEXA ) 10 MG tablet Take 1 tablet (10 mg total) by mouth daily. 11/24/23   Tobie Suzzane POUR, MD  clonazePAM  (KLONOPIN ) 0.5 MG tablet Take 1 tablet (0.5 mg total) by mouth daily. 11/24/23   Tobie Suzzane POUR, MD  esomeprazole  (NEXIUM ) 40 MG capsule Take 1 capsule (40 mg total) by mouth daily at 12 noon. 11/24/23   Tobie Suzzane POUR, MD    Allergies: Penicillins    Review of Systems  Constitutional:  Negative for fever.  HENT:  Negative for facial swelling.   Gastrointestinal:  Negative for vomiting.  Skin:  Negative for rash.  Neurological:  Positive for  headaches. Negative for weakness.    Updated Vital Signs BP (!) 148/94 (BP Location: Right Arm)   Pulse 63   Temp 98.5 F (36.9 C) (Oral)   Resp 16   SpO2 99%   Physical Exam Vitals and nursing note reviewed.  Constitutional:      General: He is not in acute distress.    Appearance: He is well-developed. He is not diaphoretic.  HENT:     Head: Normocephalic.      Nose: Nose normal.  Eyes:     Conjunctiva/sclera: Conjunctivae normal.     Pupils: Pupils are equal, round, and reactive to light.  Cardiovascular:     Rate and Rhythm: Normal rate and regular rhythm.     Pulses: Normal pulses.     Heart sounds: Normal heart sounds.  Pulmonary:     Effort: Pulmonary effort is normal.     Breath sounds: Normal breath sounds. No wheezing or rales.  Abdominal:     General: Bowel sounds are normal.     Palpations: Abdomen is soft.     Tenderness: There is no abdominal tenderness. There is no guarding or rebound.  Musculoskeletal:        General: Normal range of motion.     Cervical back: Normal range of motion and neck supple.  Skin:    General: Skin is warm  and dry.     Capillary Refill: Capillary refill takes less than 2 seconds.  Neurological:     General: No focal deficit present.     Mental Status: He is alert and oriented to person, place, and time.     Deep Tendon Reflexes: Reflexes normal.  Psychiatric:        Thought Content: Thought content normal.     (all labs ordered are listed, but only abnormal results are displayed) Labs Reviewed  CBG MONITORING, ED - Abnormal; Notable for the following components:      Result Value   Glucose-Capillary 104 (*)    All other components within normal limits    EKG: None  Radiology: No results found.   Procedures   Medications Ordered in the ED  ketorolac  (TORADOL ) 30 MG/ML injection 30 mg (30 mg Intramuscular Not Given 12/23/23 0529)  famotidine  (PEPCID ) tablet 20 mg (20 mg Oral Given 12/23/23 0529)  loratadine   (CLARITIN ) tablet 10 mg (10 mg Oral Given 12/23/23 0529)                                    Medical Decision Making Bee sting and wanted glucose checked   Amount and/or Complexity of Data Reviewed External Data Reviewed: notes.    Details: Previous notes reviewed  Labs: ordered.    Details: CBG 104   Risk OTC drugs. Prescription drug management. Risk Details: Initially refused medication stating if would interfere with klonopin .  EDP stated it would not.  He refused toradol  for pain but took claritin  and pepcid .  I stated I would prescribe steroids if swelling worsened and he could fill those as well.  He is amenable to this plan.  No hives no swelling of the lips.  Stable for discharge./       Final diagnoses:  Headache disorder  Bee sting, accidental or unintentional, initial encounter    No signs of systemic illness or infection. The patient is nontoxic-appearing on exam and vital signs are within normal limits.  I have reviewed the triage vital signs and the nursing notes. Pertinent labs & imaging results that were available during my care of the patient were reviewed by me and considered in my medical decision making (see chart for details). After history, exam, and medical workup I feel the patient has been appropriately medically screened and is safe for discharge home. Pertinent diagnoses were discussed with the patient. Patient was given return precautions.    ED Discharge Orders          Ordered    cetirizine  (ZYRTEC  ALLERGY) 10 MG tablet  Daily        12/23/23 0552    famotidine  (PEPCID ) 20 MG tablet  2 times daily        12/23/23 0552    predniSONE  (DELTASONE ) 20 MG tablet        12/23/23 0552               Myosha Cuadras, MD 12/23/23 [phone]

## 2023-12-23 NOTE — ED Notes (Signed)
 PT refused all 3 medications given by EDP.
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
## 2023-12-24 ENCOUNTER — Other Ambulatory Visit: Payer: Self-pay | Admitting: Internal Medicine

## 2023-12-24 DIAGNOSIS — F411 Generalized anxiety disorder: Secondary | ICD-10-CM

## 2023-12-24 DIAGNOSIS — F331 Major depressive disorder, recurrent, moderate: Secondary | ICD-10-CM

## 2023-12-24 MED ORDER — CITALOPRAM HYDROBROMIDE 10 MG PO TABS: 10.0000 mg | ORAL_TABLET | Freq: Every day | ORAL | 1 refills | Status: DC

## 2023-12-24 MED ORDER — CLONAZEPAM 0.5 MG PO TABS: 0.5000 mg | ORAL_TABLET | Freq: Every day | ORAL | 0 refills | Status: DC

## 2023-12-24 NOTE — Telephone Encounter (Unsigned)
 Copied from CRM [id number]. Topic: Clinical - Medication Refill >> Dec 24, 2023  2:28 PM Carlatta H wrote: Medication: clonazePAM  (KLONOPIN ) 0.5 MG tablet citalopram  (CELEXA ) 10 MG tablet [[id number]   Has the patient contacted their pharmacy? No (Agent: If no, request that the patient contact the pharmacy for the refill. If patient does not wish to contact the pharmacy document the reason why and proceed with request.) (Agent: If yes, when and what did the pharmacy advise?)  This is the patient's preferred pharmacy:    [id number] - [place], [place] - [address] AT [facility] [address] [place] [address] Phone: [phone] Fax: [phone]  Is this the correct pharmacy for this prescription? Yes If no, delete pharmacy and type the correct one.   Has the prescription been filled recently? No  Is the patient out of the medication? Yes  Has the patient been seen for an appointment in the last year OR does the patient have an upcoming appointment? Yes  Can we respond through MyChart? Yes  Agent: Please be advised that Rx refills may take up to 3 business days. We ask that you follow-up with your pharmacy.

## 2023-12-31 ENCOUNTER — Emergency Department (HOSPITAL_COMMUNITY)
Admission: EM | Admit: 2023-12-31 | Discharge: 2023-12-31 | Disposition: A | Discharge status: Home / Self Care | Payer: MEDICAID | Attending: Emergency Medicine | Admitting: Emergency Medicine

## 2023-12-31 ENCOUNTER — Other Ambulatory Visit: Payer: Self-pay

## 2023-12-31 ENCOUNTER — Encounter (HOSPITAL_COMMUNITY): Payer: Self-pay | Admitting: Emergency Medicine

## 2023-12-31 ENCOUNTER — Emergency Department (HOSPITAL_COMMUNITY): Payer: MEDICAID

## 2023-12-31 DIAGNOSIS — Z7951 Long term (current) use of inhaled steroids: Secondary | ICD-10-CM | POA: Insufficient documentation

## 2023-12-31 DIAGNOSIS — R0602 Shortness of breath: Secondary | ICD-10-CM | POA: Diagnosis not present

## 2023-12-31 DIAGNOSIS — R42 Dizziness and giddiness: Secondary | ICD-10-CM | POA: Diagnosis present

## 2023-12-31 DIAGNOSIS — R110 Nausea: Secondary | ICD-10-CM | POA: Insufficient documentation

## 2023-12-31 DIAGNOSIS — J45909 Unspecified asthma, uncomplicated: Secondary | ICD-10-CM | POA: Insufficient documentation

## 2023-12-31 LAB — CBC
HCT: 43.8 % (ref 39.0–52.0)
Hemoglobin: 15.1 g/dL (ref 13.0–17.0)
MCH: 29.5 pg (ref 26.0–34.0)
MCHC: 34.5 g/dL (ref 30.0–36.0)
MCV: 85.7 fL (ref 80.0–100.0)
Platelets: 253 K/uL (ref 150–400)
RBC: 5.11 MIL/uL (ref 4.22–5.81)
RDW: 12.5 % (ref 11.5–15.5)
WBC: 5.6 K/uL (ref 4.0–10.5)
nRBC: 0 % (ref 0.0–0.2)

## 2023-12-31 LAB — COMPREHENSIVE METABOLIC PANEL WITH GFR
ALT: 18 U/L (ref 0–44)
AST: 20 U/L (ref 15–41)
Albumin: 3.8 g/dL (ref 3.5–5.0)
Alkaline Phosphatase: 57 U/L (ref 38–126)
Anion gap: 9 (ref 5–15)
BUN: 20 mg/dL (ref 6–20)
CO2: 24 mmol/L (ref 22–32)
Calcium: 9.2 mg/dL (ref 8.9–10.3)
Chloride: 105 mmol/L (ref 98–111)
Creatinine, Ser: 0.94 mg/dL (ref 0.61–1.24)
GFR, Estimated: >60 mL/min (ref >60)
Glucose, Bld: 101 mg/dL — ABNORMAL HIGH (ref 70–99)
Potassium: 3.8 mmol/L (ref 3.5–5.1)
Sodium: 138 mmol/L (ref 135–145)
Total Bilirubin: 0.5 mg/dL (ref 0.0–1.2)
Total Protein: 6.9 g/dL (ref 6.5–8.1)

## 2023-12-31 MED ORDER — ALBUTEROL SULFATE HFA 108 (90 BASE) MCG/ACT IN AERS
2.0000 | INHALATION_SPRAY | Freq: Four times a day (QID) | RESPIRATORY_TRACT | Status: DC
  Administered 2023-12-31: 2 via RESPIRATORY_TRACT
  Filled 2023-12-31: qty 6.7

## 2023-12-31 MED ORDER — ONDANSETRON 8 MG PO TBDP
8.0000 mg | ORAL_TABLET | Freq: Once | ORAL | Status: AC
  Administered 2023-12-31: 8 mg via ORAL
  Filled 2023-12-31: qty 1

## 2023-12-31 NOTE — ED Triage Notes (Signed)
 Pt reports feeling dizzy with nausea and lightheaded since last night

## 2023-12-31 NOTE — ED Notes (Signed)
Pt ambulated in hall unassisted with steady gait.

## 2023-12-31 NOTE — Discharge Instructions (Addendum)
 As discussed, your workup appears reassuring.  Please follow-up with your PCP.  Continue taking all medications as prescribed.  Return to the ED with any new symptoms.
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
## 2023-12-31 NOTE — ED Notes (Signed)
 Patient d/c with home care instructions. IV discontinued.

## 2024-01-04 ENCOUNTER — Ambulatory Visit: Payer: MEDICAID | Admitting: Internal Medicine

## 2024-01-04 ENCOUNTER — Telehealth: Payer: Self-pay

## 2024-01-04 NOTE — Telephone Encounter (Signed)
 Scheduled

## 2024-01-04 NOTE — Telephone Encounter (Signed)
 Copied from CRM #[id number]. Topic: Appointments - Scheduling Inquiry for Clinic >> Jan 04, 2024  9:24 AM Sasha H wrote: Reason for CRM: Pt has an appt today at 11am but thought it was at 1pm and went into work and can't get off. Provider doesn't have anything else until September but tpt states he really needs to get in and be seen as he has been in and out the hospital. Please reach out to pt

## 2024-01-05 ENCOUNTER — Encounter: Payer: Self-pay | Admitting: Internal Medicine

## 2024-01-05 ENCOUNTER — Ambulatory Visit (INDEPENDENT_AMBULATORY_CARE_PROVIDER_SITE_OTHER): Payer: MEDICAID | Admitting: Internal Medicine

## 2024-01-05 VITALS — BP 137/81 | HR 72 | Ht 73.0 in | Wt 201.2 lb

## 2024-01-05 DIAGNOSIS — F331 Major depressive disorder, recurrent, moderate: Secondary | ICD-10-CM | POA: Diagnosis not present

## 2024-01-05 DIAGNOSIS — F411 Generalized anxiety disorder: Secondary | ICD-10-CM

## 2024-01-05 DIAGNOSIS — Z113 Encounter for screening for infections with a predominantly sexual mode of transmission: Secondary | ICD-10-CM

## 2024-01-05 DIAGNOSIS — L304 Erythema intertrigo: Secondary | ICD-10-CM | POA: Insufficient documentation

## 2024-01-05 MED ORDER — FLUOXETINE HCL 10 MG PO CAPS
10.0000 mg | ORAL_CAPSULE | Freq: Every day | ORAL | 3 refills | Status: DC
Start: 2024-01-05 — End: 2024-03-13

## 2024-01-05 MED ORDER — KETOCONAZOLE 2 % EX CREA: 1.0000 | TOPICAL_CREAM | Freq: Every day | CUTANEOUS | 1 refills | Status: AC

## 2024-01-05 NOTE — Assessment & Plan Note (Signed)
 Denies dysuria, rash or urethral discharge currently, but requests STD screening Check HIV antibody, HBsAg, RPR, chlamydia and Neisseria testing Advised to use barrier contraceptive

## 2024-01-05 NOTE — Patient Instructions (Addendum)
 Please start taking Fluoxetine  regularly.  Please start taking Clonazepam  as needed for severe anxiety.

## 2024-01-05 NOTE — Assessment & Plan Note (Signed)
 Groin rash likely intertrigo Started ketoconazole  cream

## 2024-01-05 NOTE — Assessment & Plan Note (Signed)
 Uncontrolled, has panic episodes DC Celexa  10 mg QD, switched to Prozac  10 mg QD Continue clonazepam  0.5 mg twice daily as needed for panic episode Referred to [facility] therapy - has appt on 03/30/24 Checked CBC, CMP, TSH, free T4
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
## 2024-01-05 NOTE — Assessment & Plan Note (Signed)
 Reports longstanding history of MDD Concern for bipolar disorder, considering episodes of anxiety and agitation Report of losing job and selling his car are also concerning signs Has separated from his partner, has 2 daughters, who are under their mother's custody He has a sister, who has been helping financially at times He has tried antidepressants in the past, names unknown -was prescribed Seroquel in the recent visit by previous PCP  Started Prozac  10 mg QD, advised to remain compliant Referred to [facility] therapy

## 2024-01-06 LAB — HIV ANTIBODY (ROUTINE TESTING W REFLEX): HIV Screen 4th Generation wRfx: NONREACTIVE

## 2024-01-06 LAB — RPR: RPR Ser Ql: NONREACTIVE

## 2024-01-06 LAB — HEPATITIS B SURFACE ANTIGEN: Hepatitis B Surface Ag: NEGATIVE

## 2024-01-07 ENCOUNTER — Ambulatory Visit: Payer: Self-pay | Admitting: Internal Medicine

## 2024-01-07 LAB — CHLAMYDIA/GC NAA, CONFIRMATION
Chlamydia trachomatis, NAA: NEGATIVE
Neisseria gonorrhoeae, NAA: NEGATIVE

## 2024-02-28 ENCOUNTER — Emergency Department (HOSPITAL_COMMUNITY): Payer: MEDICAID

## 2024-02-28 ENCOUNTER — Other Ambulatory Visit: Payer: Self-pay

## 2024-02-28 ENCOUNTER — Emergency Department (HOSPITAL_COMMUNITY)
Admission: EM | Admit: 2024-02-28 | Discharge: 2024-02-28 | Discharge status: Against Medical Advice | Payer: MEDICAID | Attending: Emergency Medicine | Admitting: Emergency Medicine

## 2024-02-28 ENCOUNTER — Encounter: Payer: Self-pay | Admitting: Internal Medicine

## 2024-02-28 ENCOUNTER — Other Ambulatory Visit: Payer: Self-pay | Admitting: Internal Medicine

## 2024-02-28 ENCOUNTER — Ambulatory Visit: Payer: Self-pay

## 2024-02-28 DIAGNOSIS — R079 Chest pain, unspecified: Secondary | ICD-10-CM | POA: Diagnosis not present

## 2024-02-28 DIAGNOSIS — Z5321 Procedure and treatment not carried out due to patient leaving prior to being seen by health care provider: Secondary | ICD-10-CM | POA: Insufficient documentation

## 2024-02-28 DIAGNOSIS — R519 Headache, unspecified: Secondary | ICD-10-CM | POA: Diagnosis present

## 2024-02-28 DIAGNOSIS — R110 Nausea: Secondary | ICD-10-CM | POA: Insufficient documentation

## 2024-02-28 LAB — COMPREHENSIVE METABOLIC PANEL WITH GFR
ALT: 15 U/L (ref 0–44)
AST: 25 U/L (ref 15–41)
Albumin: 4.6 g/dL (ref 3.5–5.0)
Alkaline Phosphatase: 69 U/L (ref 38–126)
Anion gap: 11 (ref 5–15)
BUN: 18 mg/dL (ref 6–20)
CO2: 25 mmol/L (ref 22–32)
Calcium: 9.7 mg/dL (ref 8.9–10.3)
Chloride: 103 mmol/L (ref 98–111)
Creatinine, Ser: 1.12 mg/dL (ref 0.61–1.24)
GFR, Estimated: >60 mL/min (ref >60)
Glucose, Bld: 96 mg/dL (ref 70–99)
Potassium: 4.6 mmol/L (ref 3.5–5.1)
Sodium: 138 mmol/L (ref 135–145)
Total Bilirubin: 0.3 mg/dL (ref 0.0–1.2)
Total Protein: 7.1 g/dL (ref 6.5–8.1)

## 2024-02-28 LAB — CBC WITH DIFFERENTIAL/PLATELET
Abs Immature Granulocytes: 0.01 K/uL (ref 0.00–0.07)
Basophils Absolute: 0 K/uL (ref 0.0–0.1)
Basophils Relative: 1 %
Eosinophils Absolute: 0.2 K/uL (ref 0.0–0.5)
Eosinophils Relative: 5 %
HCT: 47.1 % (ref 39.0–52.0)
Hemoglobin: 16.5 g/dL (ref 13.0–17.0)
Immature Granulocytes: 0 %
Lymphocytes Relative: 42 %
Lymphs Abs: 1.9 K/uL (ref 0.7–4.0)
MCH: 30.2 pg (ref 26.0–34.0)
MCHC: 35 g/dL (ref 30.0–36.0)
MCV: 86.3 fL (ref 80.0–100.0)
Monocytes Absolute: 0.4 K/uL (ref 0.1–1.0)
Monocytes Relative: 9 %
Neutro Abs: 2 K/uL (ref 1.7–7.7)
Neutrophils Relative %: 43 %
Platelets: 294 K/uL (ref 150–400)
RBC: 5.46 MIL/uL (ref 4.22–5.81)
RDW: 12.2 % (ref 11.5–15.5)
WBC: 4.6 K/uL (ref 4.0–10.5)
nRBC: 0 % (ref 0.0–0.2)

## 2024-02-28 LAB — LIPASE, BLOOD: Lipase: 53 U/L — ABNORMAL HIGH (ref 11–51)

## 2024-02-28 LAB — TROPONIN T, HIGH SENSITIVITY: Troponin T High Sensitivity: <15 ng/L (ref 0–19)

## 2024-02-28 MED ORDER — CLONAZEPAM 0.5 MG PO TABS: 0.5000 mg | ORAL_TABLET | Freq: Every day | ORAL | 0 refills | Status: DC

## 2024-02-28 NOTE — ED Notes (Signed)
 Patient report he just want to leave and take his IV out.
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
## 2024-02-28 NOTE — Telephone Encounter (Signed)
 Copied from CRM #[id number]. Topic: Clinical - Medication Refill >> Feb 28, 2024  2:40 PM Joesph B wrote: Medication:   clonazePAM  (KLONOPIN ) 0.5 MG tablet  Has the patient contacted their pharmacy? Yes (Agent: If no, request that the patient contact the pharmacy for the refill. If patient does not wish to contact the pharmacy document the reason why and proceed with request.) (Agent: If yes, when and what did the pharmacy advise?)  This is the patient's preferred pharmacy:  [id number] - [address] [place] [address] Phone: [phone] Fax: [phone]   Is this the correct pharmacy for this prescription? Yes If no, delete pharmacy and type the correct one.   Has the prescription been filled recently? No  Is the patient out of the medication? Yes  Has the patient been seen for an appointment in the last year OR does the patient have an upcoming appointment? Yes  Can we respond through MyChart? Yes  Agent: Please be advised that Rx refills may take up to 3 business days. We ask that you follow-up with your pharmacy.

## 2024-02-28 NOTE — Telephone Encounter (Signed)
 This encounter was created in error - please disregard.

## 2024-02-28 NOTE — ED Triage Notes (Addendum)
 Patient c/o headache and chest pain x 3 days. Patient report sharp pain is radiating to left arm.  Patient report taking OTC medication without relief. Patient report nausea, denies vomiting. Patient report dizziness, denies blurred vision.

## 2024-03-06 ENCOUNTER — Ambulatory Visit: Payer: MEDICAID | Admitting: Internal Medicine

## 2024-03-13 ENCOUNTER — Other Ambulatory Visit: Payer: Self-pay | Admitting: Internal Medicine

## 2024-03-13 ENCOUNTER — Encounter: Payer: Self-pay | Admitting: Internal Medicine

## 2024-03-13 DIAGNOSIS — F411 Generalized anxiety disorder: Secondary | ICD-10-CM

## 2024-03-13 MED ORDER — FLUOXETINE HCL 10 MG PO CAPS: 10.0000 mg | ORAL_CAPSULE | Freq: Every day | ORAL | 3 refills | Status: DC

## 2024-03-13 NOTE — Telephone Encounter (Signed)
 Copied from CRM [id number]. Topic: Clinical - Medication Refill >> Mar 13, 2024  3:11 PM Montie POUR wrote: Medication:   FLUoxetine  (PROZAC ) 10 MG capsule   Has the patient contacted their pharmacy? Yes (Agent: If no, request that the patient contact the pharmacy for the refill. If patient does not wish to contact the pharmacy document the reason why and proceed with request.) (Agent: If yes, when and what did the pharmacy advise?) Pharmacy needs order to refill  This is the patient's preferred pharmacy:  [id number] [address] [facility] - [address] [place] [address] Phone: [phone] Fax: [phone]   Is this the correct pharmacy for this prescription? Yes If no, delete pharmacy and type the correct one.   Has the prescription been filled recently? No  Is the patient out of the medication? Yes  Has the patient been seen for an appointment in the last year OR does the patient have an upcoming appointment? Yes  Can we respond through MyChart? Yes  Agent: Please be advised that Rx refills may take up to 3 business days. We ask that you follow-up with your pharmacy.

## 2024-03-28 ENCOUNTER — Other Ambulatory Visit: Payer: Self-pay | Admitting: Internal Medicine

## 2024-03-28 NOTE — Telephone Encounter (Signed)
 Copied from CRM [id number]. Topic: Clinical - Medication Refill >> Mar 28, 2024 11:59 AM Nathanel BROCKS wrote: Medication: clonazePAM  (KLONOPIN ) 0.5 MG tablet   Has the patient contacted their pharmacy? No   This is the patient's preferred pharmacy:  [id number] [place] [place] - [address] [place] [address] Phone: [phone] Fax: [phone]    Is this the correct pharmacy for this prescription? Yes If no, delete pharmacy and type the correct one.   Has the prescription been filled recently? Yes  Is the patient out of the medication? Yes  Has the patient been seen for an appointment in the last year OR does the patient have an upcoming appointment? Yes  Can we respond through MyChart? Yes  Agent: Please be advised that Rx refills may take up to 3 business days. We ask that you follow-up with your pharmacy.

## 2024-03-29 ENCOUNTER — Encounter: Payer: Self-pay | Admitting: Internal Medicine

## 2024-03-29 ENCOUNTER — Telehealth: Payer: MEDICAID | Admitting: Internal Medicine

## 2024-03-29 DIAGNOSIS — F411 Generalized anxiety disorder: Secondary | ICD-10-CM | POA: Diagnosis not present

## 2024-03-29 DIAGNOSIS — F4310 Post-traumatic stress disorder, unspecified: Secondary | ICD-10-CM

## 2024-03-29 DIAGNOSIS — F331 Major depressive disorder, recurrent, moderate: Secondary | ICD-10-CM

## 2024-03-29 MED ORDER — FLUOXETINE HCL 10 MG PO CAPS: 10.0000 mg | ORAL_CAPSULE | Freq: Every day | ORAL | 3 refills | Status: DC

## 2024-03-29 MED ORDER — ARIPIPRAZOLE 10 MG PO TABS: 10.0000 mg | ORAL_TABLET | Freq: Every day | ORAL | 2 refills | Status: AC

## 2024-03-29 MED ORDER — CLONAZEPAM 0.5 MG PO TABS: 0.5000 mg | ORAL_TABLET | Freq: Every day | ORAL | 2 refills | Status: DC
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
## 2024-03-29 NOTE — Assessment & Plan Note (Addendum)
 Reports longstanding history of MDD Concern for bipolar disorder, considering episodes of anxiety and agitation Report of losing job and selling his car are also concerning signs Has separated from his partner, has 2 daughters, who are under their mother's custody He has a sister, who has been helping financially at times, currently lives in [place] with a friend He has tried antidepressants in the past, names unknown -was prescribed Seroquel in the recent visit by previous PCP  Continue Prozac  10 mg QD, advised to remain compliant Added Abilify 10 mg QD Referred to [facility] therapy
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
## 2024-03-29 NOTE — Patient Instructions (Signed)
 Please continue taking fluoxetine  as prescribed.  Please start taking aripiprazole 10 mg once daily.  Please take clonazepam  as needed for severe anxiety/panic episode.  Please follow-up with psychiatry as discussed.

## 2024-03-29 NOTE — Assessment & Plan Note (Addendum)
 Uncontrolled, has panic episodes Started Prozac  10 mg QD in the last visit Continue clonazepam  0.5 mg twice daily as needed for panic episode Concern for bipolar disorder, added Abilify 10 mg QD Referred to [facility] as per patient request Checked CBC, CMP, TSH, free T4

## 2024-03-30 ENCOUNTER — Ambulatory Visit (HOSPITAL_COMMUNITY): Payer: MEDICAID | Admitting: Registered Nurse

## 2024-04-06 ENCOUNTER — Encounter: Payer: Self-pay | Admitting: Internal Medicine

## 2024-04-06 NOTE — Addendum Note (Signed)
 Addended byBETHA TOBIE DOWNS on: 04/06/2024 03:47 PM   Modules accepted: Orders

## 2024-04-14 ENCOUNTER — Telehealth: Payer: Self-pay

## 2024-04-14 ENCOUNTER — Other Ambulatory Visit: Payer: Self-pay | Admitting: Internal Medicine

## 2024-04-14 DIAGNOSIS — F411 Generalized anxiety disorder: Secondary | ICD-10-CM

## 2024-04-14 NOTE — Telephone Encounter (Signed)
 Copied from CRM #[id number]. Topic: Appointments - Appointment Scheduling >> Apr 14, 2024 11:23 AM Delon DASEN wrote: Patient would like to speak with Dr Tobie  [phone], would not say what it is regarding.

## 2024-04-14 NOTE — Telephone Encounter (Signed)
 Copied from CRM #[id number]. Topic: Clinical - Medication Refill >> Apr 14, 2024 11:21 AM Delon DASEN wrote: Medication: FLUoxetine  (PROZAC ) 10 MG capsule  Has the patient contacted their pharmacy? No (Agent: If no, request that the patient contact the pharmacy for the refill. If patient does not wish to contact the pharmacy document the reason why and proceed with request.) (Agent: If yes, when and what did the pharmacy advise?)  This is the patient's preferred pharmacy:  [id number] [address] [facility] - [address] [place] [address] Phone: [phone] Fax: [phone]  Is this the correct pharmacy for this prescription? Yes If no, delete pharmacy and type the correct one.   Has the prescription been filled recently? Yes  Is the patient out of the medication? Yes  Has the patient been seen for an appointment in the last year OR does the patient have an upcoming appointment? Yes  Can we respond through MyChart? Yes  Agent: Please be advised that Rx refills may take up to 3 business days. We ask that you follow-up with your pharmacy.

## 2024-05-02 ENCOUNTER — Other Ambulatory Visit: Payer: Self-pay | Admitting: Internal Medicine

## 2024-05-02 ENCOUNTER — Telehealth: Payer: Self-pay | Admitting: Internal Medicine

## 2024-05-02 NOTE — Telephone Encounter (Unsigned)
 Copied from CRM [id number]. Topic: Clinical - Medication Refill >> May 02, 2024  2:54 PM Alfonso HERO wrote: Medication: clonazePAM  (KLONOPIN ) 0.5 MG tablet  Has the patient contacted their pharmacy? No (Agent: If no, request that the patient contact the pharmacy for the refill. If patient does not wish to contact the pharmacy document the reason why and proceed with request.) (Agent: If yes, when and what did the pharmacy advise?)  This is the patient's preferred pharmacy:  [id number] [address] [facility] - [address] [place] [address] Phone: [phone] Fax: [phone]    Is this the correct pharmacy for this prescription? Yes If no, delete pharmacy and type the correct one.   Has the prescription been filled recently? Yes  Is the patient out of the medication? Yes  Has the patient been seen for an appointment in the last year OR does the patient have an upcoming appointment? Yes  Can we respond through MyChart? Yes  Agent: Please be advised that Rx refills may take up to 3 business days. We ask that you follow-up with your pharmacy.

## 2024-05-05 ENCOUNTER — Ambulatory Visit: Payer: Self-pay | Admitting: Internal Medicine

## 2024-05-09 ENCOUNTER — Ambulatory Visit: Payer: MEDICAID | Admitting: Internal Medicine

## 2024-05-24 ENCOUNTER — Ambulatory Visit: Payer: Self-pay | Admitting: Internal Medicine

## 2024-05-30 ENCOUNTER — Other Ambulatory Visit: Payer: Self-pay | Admitting: Internal Medicine

## 2024-05-30 DIAGNOSIS — F411 Generalized anxiety disorder: Secondary | ICD-10-CM

## 2024-05-30 MED ORDER — FLUOXETINE HCL 10 MG PO CAPS: 10.0000 mg | ORAL_CAPSULE | Freq: Every day | ORAL | 3 refills | Status: AC

## 2024-05-30 MED ORDER — CLONAZEPAM 0.5 MG PO TABS: 0.5000 mg | ORAL_TABLET | Freq: Every day | ORAL | 2 refills | Status: AC

## 2024-05-30 NOTE — Telephone Encounter (Signed)
 Copied from CRM [id number]. Topic: Clinical - Medication Refill >> May 30, 2024  1:26 PM Deaijah H wrote: Medication: FLUoxetine  (PROZAC ) 10 MG capsule/clonazePAM  (KLONOPIN ) 0.5 MG tablet  Has the patient contacted their pharmacy? Yes (Agent: If no, request that the patient contact the pharmacy for the refill. If patient does not wish to contact the pharmacy document the reason why and proceed with request.) Advised to call PCP (Agent: If yes, when and what did the pharmacy advise?)  This is the patient's preferred pharmacy:  [id number] [address] [facility] - [address] [place] [address] Phone: [phone] Fax: [phone]   Is this the correct pharmacy for this prescription? Yes If no, delete pharmacy and type the correct one.   Has the prescription been filled recently? Yes  Is the patient out of the medication? Yes  Has the patient been seen for an appointment in the last year OR does the patient have an upcoming appointment? Yes  Can we respond through MyChart? Yes  Agent: Please be advised that Rx refills may take up to 3 business days. We ask that you follow-up with your pharmacy.

## 2024-06-06 ENCOUNTER — Ambulatory Visit: Payer: MEDICAID | Admitting: Internal Medicine

## 2024-06-07 ENCOUNTER — Ambulatory Visit: Payer: Self-pay | Admitting: Family Medicine

## 2024-06-22 ENCOUNTER — Emergency Department: Payer: MEDICAID

## 2024-06-22 ENCOUNTER — Emergency Department
Admission: EM | Admit: 2024-06-22 | Discharge: 2024-06-22 | Disposition: A | Discharge status: Home / Self Care | Payer: MEDICAID | Attending: Emergency Medicine | Admitting: Emergency Medicine

## 2024-06-22 ENCOUNTER — Encounter: Payer: Self-pay | Admitting: Emergency Medicine

## 2024-06-22 ENCOUNTER — Other Ambulatory Visit: Payer: Self-pay

## 2024-06-22 DIAGNOSIS — J069 Acute upper respiratory infection, unspecified: Secondary | ICD-10-CM | POA: Diagnosis not present

## 2024-06-22 DIAGNOSIS — R059 Cough, unspecified: Secondary | ICD-10-CM | POA: Diagnosis present

## 2024-06-22 LAB — RESP PANEL BY RT-PCR (RSV, FLU A&B, COVID)  RVPGX2
Influenza A by PCR: NEGATIVE
Influenza B by PCR: NEGATIVE
Resp Syncytial Virus by PCR: NEGATIVE
SARS Coronavirus 2 by RT PCR: NEGATIVE

## 2024-06-22 MED ORDER — ACETAMINOPHEN 325 MG PO TABS
650.0000 mg | ORAL_TABLET | Freq: Once | ORAL | Status: AC
  Administered 2024-06-22: 650 mg via ORAL
  Filled 2024-06-22: qty 2

## 2024-06-22 MED ORDER — IBUPROFEN 600 MG PO TABS
600.0000 mg | ORAL_TABLET | Freq: Once | ORAL | Status: AC
  Administered 2024-06-22: 600 mg via ORAL
  Filled 2024-06-22: qty 1

## 2024-06-22 NOTE — Discharge Instructions (Addendum)
 Your testing in the emergency department was negative for influenza, COVID, RSV, and your x-ray did not show any deeper lung infections that would require antibiotics.  As suspected this is likely a virus causing your symptoms that will get better with time.  Take acetaminophen  650 mg and ibuprofen  400 mg every 6 hours for aches/pain/fever.  Take with food.  Drink plenty of fluids to stay well-hydrated.  For nasal congestion, find a sinus rinse at your local pharmacy and use as directed.  You should continue taking all previously prescribed medications.  Thank you for choosing us  for your health care today!  Please see your primary doctor this week for a follow up appointment.   If you have any new, worsening, or unexpected symptoms call your doctor right away or come back to the emergency department for reevaluation.  It was my pleasure to care for you today.   Ginnie EDISON Cyrena, MD

## 2024-06-22 NOTE — ED Provider Notes (Signed)
 "  [facility] Provider Note    Event Date/Time   First MD Initiated Contact with Patient 06/22/24 0230     (approximate)   History   Cough   HPI  Tracy [name] is a 29 y.o. male   Past medical history of generalized anxiety, depression, here with 2 to 3 days of cough, nasal congestion, body aches, headache, and has a niece with similar symptoms.  Up-to-date on childhood vaccinations but no flu shot this year.  No GI symptoms.  No GU symptoms.   External Medical Documents Reviewed: Previous outpatient notes      Physical Exam   Triage Vital Signs: ED Triage Vitals  Encounter Vitals Group     BP 06/22/24 0225 (!) 154/99     Girls Systolic BP Percentile --      Girls Diastolic BP Percentile --      Boys Systolic BP Percentile --      Boys Diastolic BP Percentile --      Pulse Rate 06/22/24 0225 89     Resp 06/22/24 0225 18     Temp 06/22/24 0225 99.3 F (37.4 C)     Temp Source 06/22/24 0242 Oral     SpO2 06/22/24 0225 99 %     Weight 06/22/24 0224 225 lb (102.1 kg)     Height 06/22/24 0224 6' 1 (1.854 m)     Head Circumference --      Peak Flow --      Pain Score 06/22/24 0233 5     Pain Loc --      Pain Education --      Exclude from Growth Chart --     Most recent vital signs: Vitals:   06/22/24 0242 06/22/24 0300  BP:  (!) 148/77  Pulse:  84  Resp:  16  Temp: 98.6 F (37 C)   SpO2:  100%    General: Awake, no distress.  CV:  Good peripheral perfusion.  Resp:  Normal effort.  Abd:  No distention.  Other:  Comfortable appearing, vital signs normal, except for mildly high blood pressure.  No hypoxemia breathing comfortably on room air with clear lungs to auscultation in all fields.  Skin appears warm well-perfused neck supple full range of motion overall nontoxic and comfortable appearing.  No noted intraoral lesions.   ED Results / Procedures / Treatments   Labs (all labs ordered are listed, but only abnormal results  are displayed) Labs Reviewed  RESP PANEL BY RT-PCR (RSV, FLU A&B, COVID)  [id number]     I ordered and reviewed the above labs they are notable for negative viral testing   RADIOLOGY I independently reviewed and interpreted chest x-ray and see no obvious focality I also reviewed radiologist's formal read.   PROCEDURES:  Critical Care performed: No  Procedures   MEDICATIONS ORDERED IN ED: Medications  acetaminophen  (TYLENOL ) tablet 650 mg (650 mg Oral Given 06/22/24 0247)  ibuprofen  (ADVIL ) tablet 600 mg (600 mg Oral Given 06/22/24 0246)     IMPRESSION / MDM / ASSESSMENT AND PLAN / ED COURSE  I reviewed the triage vital signs and the nursing notes.                                Patient's presentation is most consistent with acute presentation with potential threat to life or bodily function.  Differential diagnosis includes, but is not limited to, viral  URI, influenza, bacterial pneumonia, sepsis or meningitis considered but less likely.    MDM: Several days of viral URI symptoms with known sick contact.  No evidence of focality on lung exam doubt bacterial pneumonia, does not appear markedly ill-appearing, doubt sepsis or meningitis.  Will get viral swabs, chest x-ray, and provided to dispo Thana guidance for likely viral URI.  I considered hospitalization for admission or observation however given his young healthy overall status and well appearance today, doubt life-threatening bacterial infection or sepsis at this time so I think appropriate for outpatient management with strict return precautions and PMD follow-up        FINAL CLINICAL IMPRESSION(S) / ED DIAGNOSES   Final diagnoses:  Viral URI with cough     Rx / DC Orders   ED Discharge Orders     None        Note:  This document was prepared using Dragon voice recognition software and may include unintentional dictation errors.    Cyrena Mylar, MD 06/22/24 [phone]  "

## 2024-06-27 ENCOUNTER — Telehealth: Payer: Self-pay

## 2024-06-27 ENCOUNTER — Ambulatory Visit (HOSPITAL_COMMUNITY): Payer: MEDICAID

## 2024-06-27 DIAGNOSIS — Z91199 Patient's noncompliance with other medical treatment and regimen due to unspecified reason: Secondary | ICD-10-CM

## 2024-06-27 NOTE — Telephone Encounter (Signed)
 Copied from CRM [id number]. Topic: Clinical - Medication Refill >> Jun 27, 2024 11:48 AM Avram MATSU wrote: Medication: clonazePAM  (KLONOPIN ) 0.5 MG tablet [[id number]]  FLUoxetine  (PROZAC ) 10 MG capsule [[id number]]  Patient is completely out   Has the patient contacted their pharmacy? Yes (Agent: If no, request that the patient contact the pharmacy for the refill. If patient does not wish to contact the pharmacy document the reason why and proceed with request.) (Agent: If yes, when and what did the pharmacy advise?)  This is the patient's preferred pharmacy:  [facility], [place] - [address] [place] [address] Phone: [phone]  Is this the correct pharmacy for this prescription? Yes If no, delete pharmacy and type the correct one.   Has the prescription been filled recently? No  Is the patient out of the medication? Yes  Has the patient been seen for an appointment in the last year OR does the patient have an upcoming appointment? No  Can we respond through MyChart? Yes  Agent: Please be advised that Rx refills may take up to 3 business days. We ask that you follow-up with your pharmacy.

## 2024-06-27 NOTE — Progress Notes (Signed)
 Tracy [name] did not arrive for his scheduled first appointment at 2 PM today.

## 2024-06-27 NOTE — Telephone Encounter (Signed)
Refills are at pharmacy  

## 2024-06-27 NOTE — Telephone Encounter (Signed)
 Copied from CRM [id number]. Topic: Appointments - Scheduling Inquiry for Clinic >> Jun 27, 2024 11:56 AM Avram MATSU wrote: Reason for CRM: patient was in the hospital on 1/22. No availability for hospital follow up, please advise [phone]

## 2024-06-27 NOTE — Telephone Encounter (Signed)
Ed follow up scheduled

## 2024-08-07 ENCOUNTER — Ambulatory Visit: Payer: MEDICAID | Admitting: Internal Medicine

## 2024-08-08 ENCOUNTER — Ambulatory Visit: Payer: Self-pay | Admitting: Internal Medicine
